# Patient Record
Sex: Male | Born: 1940
Health system: Southern US, Community
[De-identification: ages and names within clinical notes are randomized; demographics above are authoritative.]

## PROBLEM LIST (undated history)

## (undated) DIAGNOSIS — H409 Unspecified glaucoma: Secondary | ICD-10-CM

## (undated) DIAGNOSIS — N481 Balanitis: Secondary | ICD-10-CM

## (undated) DIAGNOSIS — H269 Unspecified cataract: Secondary | ICD-10-CM

## (undated) DIAGNOSIS — I639 Cerebral infarction, unspecified: Secondary | ICD-10-CM

## (undated) DIAGNOSIS — M1990 Unspecified osteoarthritis, unspecified site: Secondary | ICD-10-CM

## (undated) DIAGNOSIS — E785 Hyperlipidemia, unspecified: Secondary | ICD-10-CM

## (undated) DIAGNOSIS — I10 Essential (primary) hypertension: Secondary | ICD-10-CM

## (undated) DIAGNOSIS — Z86010 Personal history of colonic polyps: Secondary | ICD-10-CM

## (undated) HISTORY — DX: Hyperlipidemia, unspecified: E78.5

## (undated) HISTORY — DX: Cerebral infarction, unspecified: I63.9

## (undated) HISTORY — DX: Unspecified cataract: H26.9

## (undated) HISTORY — PX: CATARACT EXTRACTION: SUR2

## (undated) HISTORY — DX: Balanitis: N48.1

## (undated) HISTORY — DX: Unspecified osteoarthritis, unspecified site: M19.90

## (undated) HISTORY — DX: Essential (primary) hypertension: I10

## (undated) HISTORY — PX: TONSILLECTOMY: SHX5217

## (undated) HISTORY — DX: Personal history of colonic polyps: Z86.010

## (undated) HISTORY — DX: Unspecified glaucoma: H40.9

---

## 2001-05-30 ENCOUNTER — Ambulatory Visit (HOSPITAL_BASED_OUTPATIENT_CLINIC_OR_DEPARTMENT_OTHER): Admission: RE | Admit: 2001-05-30 | Discharge: 2001-05-30 | Payer: Self-pay | Admitting: *Deleted

## 2001-05-30 ENCOUNTER — Encounter (INDEPENDENT_AMBULATORY_CARE_PROVIDER_SITE_OTHER): Payer: Self-pay | Admitting: Specialist

## 2003-06-05 HISTORY — PX: COLONOSCOPY: SHX174

## 2004-04-22 ENCOUNTER — Ambulatory Visit: Payer: Self-pay | Admitting: Internal Medicine

## 2004-04-28 ENCOUNTER — Ambulatory Visit: Payer: Self-pay | Admitting: Internal Medicine

## 2004-08-21 ENCOUNTER — Emergency Department (HOSPITAL_COMMUNITY): Admission: EM | Admit: 2004-08-21 | Discharge: 2004-08-22 | Payer: Self-pay | Admitting: Emergency Medicine

## 2004-08-22 ENCOUNTER — Encounter: Payer: Self-pay | Admitting: Internal Medicine

## 2004-09-11 ENCOUNTER — Ambulatory Visit: Payer: Self-pay | Admitting: Internal Medicine

## 2004-09-17 ENCOUNTER — Ambulatory Visit: Payer: Self-pay

## 2004-10-15 ENCOUNTER — Ambulatory Visit: Payer: Self-pay | Admitting: Internal Medicine

## 2004-10-30 ENCOUNTER — Ambulatory Visit: Payer: Self-pay | Admitting: Internal Medicine

## 2004-12-31 ENCOUNTER — Ambulatory Visit (HOSPITAL_COMMUNITY): Admission: RE | Admit: 2004-12-31 | Discharge: 2004-12-31 | Payer: Self-pay | Admitting: Internal Medicine

## 2004-12-31 ENCOUNTER — Ambulatory Visit: Payer: Self-pay | Admitting: Internal Medicine

## 2005-05-08 ENCOUNTER — Ambulatory Visit: Payer: Self-pay | Admitting: Internal Medicine

## 2005-05-13 ENCOUNTER — Ambulatory Visit: Payer: Self-pay | Admitting: Internal Medicine

## 2005-09-03 ENCOUNTER — Ambulatory Visit: Payer: Self-pay | Admitting: Internal Medicine

## 2006-04-23 ENCOUNTER — Ambulatory Visit: Payer: Self-pay | Admitting: Internal Medicine

## 2006-06-10 ENCOUNTER — Ambulatory Visit: Payer: Self-pay | Admitting: Internal Medicine

## 2006-06-10 LAB — CONVERTED CEMR LAB
AST: 28 units/L (ref 0–37)
Basophils Absolute: 0 10*3/uL (ref 0.0–0.1)
Bilirubin, Direct: 0.2 mg/dL (ref 0.0–0.3)
CO2: 32 meq/L (ref 19–32)
Chloride: 105 meq/L (ref 96–112)
Cholesterol: 159 mg/dL (ref 0–200)
Creatinine, Ser: 0.9 mg/dL (ref 0.4–1.5)
Eosinophils Relative: 4.8 % (ref 0.0–5.0)
Glucose, Bld: 112 mg/dL — ABNORMAL HIGH (ref 70–99)
HCT: 40.1 % (ref 39.0–52.0)
HDL: 50.1 mg/dL (ref >39.0)
Hemoglobin, Urine: NEGATIVE
Hemoglobin: 13.7 g/dL (ref 13.0–17.0)
Ketones, ur: NEGATIVE mg/dL
Leukocytes, UA: NEGATIVE
MCHC: 34.2 g/dL (ref 30.0–36.0)
Monocytes Absolute: 0.4 10*3/uL (ref 0.2–0.7)
Neutrophils Relative %: 40.5 % — ABNORMAL LOW (ref 43.0–77.0)
Nitrite: NEGATIVE
PSA: 1.06 ng/mL (ref 0.10–4.00)
Potassium: 4.1 meq/L (ref 3.5–5.1)
RBC: 4.54 M/uL (ref 4.22–5.81)
RDW: 11.3 % — ABNORMAL LOW (ref 11.5–14.6)
Sodium: 143 meq/L (ref 135–145)
TSH: 1.11 microintl units/mL (ref 0.35–5.50)
Total Bilirubin: 1 mg/dL (ref 0.3–1.2)
Total Protein: 6.8 g/dL (ref 6.0–8.3)
Urobilinogen, UA: 0.2 (ref 0.0–1.0)
WBC: 3.1 10*3/uL — ABNORMAL LOW (ref 4.5–10.5)
pH: 8 (ref 5.0–8.0)

## 2006-06-15 ENCOUNTER — Ambulatory Visit: Payer: Self-pay | Admitting: Internal Medicine

## 2006-09-17 ENCOUNTER — Ambulatory Visit: Payer: Self-pay | Admitting: Internal Medicine

## 2006-10-13 ENCOUNTER — Ambulatory Visit: Payer: Self-pay | Admitting: Internal Medicine

## 2006-11-03 ENCOUNTER — Ambulatory Visit: Payer: Self-pay | Admitting: Internal Medicine

## 2006-11-03 LAB — CONVERTED CEMR LAB
Direct LDL: 140.6 mg/dL
Triglycerides: 283 mg/dL (ref 0–149)

## 2007-04-06 ENCOUNTER — Ambulatory Visit: Payer: Self-pay | Admitting: Internal Medicine

## 2007-05-16 ENCOUNTER — Encounter: Payer: Self-pay | Admitting: Internal Medicine

## 2007-05-16 DIAGNOSIS — I10 Essential (primary) hypertension: Secondary | ICD-10-CM | POA: Insufficient documentation

## 2007-05-16 DIAGNOSIS — E785 Hyperlipidemia, unspecified: Secondary | ICD-10-CM | POA: Insufficient documentation

## 2007-07-04 ENCOUNTER — Ambulatory Visit: Payer: Self-pay | Admitting: Internal Medicine

## 2007-07-04 LAB — CONVERTED CEMR LAB
Albumin: 3.9 g/dL (ref 3.5–5.2)
Basophils Absolute: 0 10*3/uL (ref 0.0–0.1)
Bilirubin, Direct: 0.1 mg/dL (ref 0.0–0.3)
Calcium: 9.3 mg/dL (ref 8.4–10.5)
Cholesterol: 190 mg/dL (ref 0–200)
Eosinophils Absolute: 0.1 10*3/uL (ref 0.0–0.6)
Eosinophils Relative: 3.3 % (ref 0.0–5.0)
GFR calc Af Amer: 96 mL/min
GFR calc non Af Amer: 79 mL/min
Glucose, Bld: 101 mg/dL — ABNORMAL HIGH (ref 70–99)
HDL: 45.5 mg/dL (ref >39.0)
LDL Cholesterol: 110 mg/dL — ABNORMAL HIGH (ref 0–99)
Lymphocytes Relative: 39 % (ref 12.0–46.0)
MCHC: 33 g/dL (ref 30.0–36.0)
MCV: 87.6 fL (ref 78.0–100.0)
Neutro Abs: 1.6 10*3/uL (ref 1.4–7.7)
Neutrophils Relative %: 43.5 % (ref 43.0–77.0)
Nitrite: NEGATIVE
PSA: 1.32 ng/mL (ref 0.10–4.00)
Platelets: 260 10*3/uL (ref 150–400)
Sodium: 142 meq/L (ref 135–145)
TSH: 1.53 microintl units/mL (ref 0.35–5.50)
Total CHOL/HDL Ratio: 4.2
Triglycerides: 174 mg/dL — ABNORMAL HIGH (ref 0–149)
Urine Glucose: NEGATIVE mg/dL
WBC: 3.6 10*3/uL — ABNORMAL LOW (ref 4.5–10.5)

## 2007-07-08 ENCOUNTER — Ambulatory Visit: Payer: Self-pay | Admitting: Internal Medicine

## 2007-07-08 DIAGNOSIS — M1990 Unspecified osteoarthritis, unspecified site: Secondary | ICD-10-CM | POA: Insufficient documentation

## 2007-09-07 ENCOUNTER — Ambulatory Visit: Payer: Self-pay | Admitting: Internal Medicine

## 2008-01-16 ENCOUNTER — Telehealth: Payer: Self-pay | Admitting: Internal Medicine

## 2008-01-31 ENCOUNTER — Ambulatory Visit: Payer: Self-pay | Admitting: Internal Medicine

## 2008-07-12 ENCOUNTER — Ambulatory Visit: Payer: Self-pay | Admitting: Internal Medicine

## 2008-07-12 LAB — CONVERTED CEMR LAB
Albumin: 3.8 g/dL (ref 3.5–5.2)
Alkaline Phosphatase: 67 units/L (ref 39–117)
Basophils Absolute: 0.1 10*3/uL (ref 0.0–0.1)
Cholesterol: 204 mg/dL — ABNORMAL HIGH (ref 0–200)
Direct LDL: 135.4 mg/dL
Eosinophils Absolute: 0.4 10*3/uL (ref 0.0–0.7)
GFR calc non Af Amer: 95.46 mL/min (ref >60)
HDL: 48.3 mg/dL (ref >39.00)
Hemoglobin, Urine: NEGATIVE
Hemoglobin: 13 g/dL (ref 13.0–17.0)
Lymphocytes Relative: 38.5 % (ref 12.0–46.0)
MCHC: 33.4 g/dL (ref 30.0–36.0)
Neutro Abs: 1.6 10*3/uL (ref 1.4–7.7)
Neutrophils Relative %: 39.5 % — ABNORMAL LOW (ref 43.0–77.0)
Nitrite: NEGATIVE
Platelets: 233 10*3/uL (ref 150.0–400.0)
Potassium: 4.6 meq/L (ref 3.5–5.1)
RDW: 11.5 % (ref 11.5–14.6)
Sodium: 140 meq/L (ref 135–145)
Total Protein, Urine: NEGATIVE mg/dL
Triglycerides: 108 mg/dL (ref 0.0–149.0)
Urobilinogen, UA: 0.2 (ref 0.0–1.0)
VLDL: 21.6 mg/dL (ref 0.0–40.0)

## 2008-07-18 ENCOUNTER — Ambulatory Visit (HOSPITAL_COMMUNITY): Admission: RE | Admit: 2008-07-18 | Discharge: 2008-07-18 | Payer: Self-pay | Admitting: Ophthalmology

## 2008-07-18 ENCOUNTER — Encounter: Payer: Self-pay | Admitting: Internal Medicine

## 2008-07-19 ENCOUNTER — Ambulatory Visit: Payer: Self-pay | Admitting: Internal Medicine

## 2008-07-19 DIAGNOSIS — H547 Unspecified visual loss: Secondary | ICD-10-CM

## 2008-07-19 DIAGNOSIS — L989 Disorder of the skin and subcutaneous tissue, unspecified: Secondary | ICD-10-CM | POA: Insufficient documentation

## 2008-08-09 ENCOUNTER — Encounter: Payer: Self-pay | Admitting: Internal Medicine

## 2008-11-30 ENCOUNTER — Ambulatory Visit: Payer: Self-pay | Admitting: Internal Medicine

## 2008-11-30 DIAGNOSIS — I6350 Cerebral infarction due to unspecified occlusion or stenosis of unspecified cerebral artery: Secondary | ICD-10-CM | POA: Insufficient documentation

## 2008-11-30 DIAGNOSIS — IMO0001 Reserved for inherently not codable concepts without codable children: Secondary | ICD-10-CM | POA: Insufficient documentation

## 2008-12-05 ENCOUNTER — Telehealth: Payer: Self-pay | Admitting: Internal Medicine

## 2008-12-19 ENCOUNTER — Ambulatory Visit: Payer: Self-pay | Admitting: Internal Medicine

## 2009-02-26 ENCOUNTER — Ambulatory Visit: Payer: Self-pay | Admitting: Internal Medicine

## 2009-02-26 DIAGNOSIS — M353 Polymyalgia rheumatica: Secondary | ICD-10-CM

## 2009-02-26 LAB — CONVERTED CEMR LAB: Rhuematoid fact SerPl-aCnc: 25.5 intl units/mL — ABNORMAL HIGH (ref 0.0–20.0)

## 2009-09-02 ENCOUNTER — Ambulatory Visit: Payer: Self-pay | Admitting: Internal Medicine

## 2009-10-14 ENCOUNTER — Telehealth: Payer: Self-pay | Admitting: Internal Medicine

## 2009-10-17 ENCOUNTER — Ambulatory Visit: Payer: Self-pay | Admitting: Internal Medicine

## 2009-10-17 LAB — CONVERTED CEMR LAB
Albumin: 4.4 g/dL (ref 3.5–5.2)
Alkaline Phosphatase: 56 units/L (ref 39–117)
LDL Cholesterol: 118 mg/dL — ABNORMAL HIGH (ref 0–99)
Total CHOL/HDL Ratio: 4
Triglycerides: 123 mg/dL (ref 0.0–149.0)
VLDL: 24.6 mg/dL (ref 0.0–40.0)

## 2009-10-20 ENCOUNTER — Encounter: Payer: Self-pay | Admitting: Internal Medicine

## 2010-03-21 ENCOUNTER — Ambulatory Visit: Payer: Self-pay | Admitting: Internal Medicine

## 2010-05-18 LAB — CONVERTED CEMR LAB
Basophils Absolute: 0 10*3/uL (ref 0.0–0.1)
Basophils Relative: 1.4 % (ref 0.0–3.0)
CO2: 31 meq/L (ref 19–32)
Eosinophils Absolute: 0.1 10*3/uL (ref 0.0–0.7)
Glucose, Bld: 93 mg/dL (ref 70–99)
HCT: 40.6 % (ref 39.0–52.0)
HDL: 55.3 mg/dL (ref >39.00)
Hemoglobin: 13.8 g/dL (ref 13.0–17.0)
Lymphs Abs: 1.4 10*3/uL (ref 0.7–4.0)
MCHC: 34.1 g/dL (ref 30.0–36.0)
Monocytes Relative: 11.2 % (ref 3.0–12.0)
Neutro Abs: 1 10*3/uL — ABNORMAL LOW (ref 1.4–7.7)
Potassium: 4.4 meq/L (ref 3.5–5.1)
RDW: 13.3 % (ref 11.5–14.6)
Sodium: 142 meq/L (ref 135–145)
Total Bilirubin: 0.5 mg/dL (ref 0.3–1.2)
Total CHOL/HDL Ratio: 4
Triglycerides: 178 mg/dL — ABNORMAL HIGH (ref 0.0–149.0)
VLDL: 35.6 mg/dL (ref 0.0–40.0)

## 2010-05-20 NOTE — Assessment & Plan Note (Signed)
Summary: FLU SHOT / MEN [facility] OK'D  Nurse Visit   Vitals Entered By: Lamar Sprinkles, CMA (March 21, 2010 2:12 PM)  Allergies: 1)  Lopid (Gemfibrozil) 2)  Niaspan (Niacin (Antihyperlipidemic)) 3)  Penicillin G Potassium (Penicillin G Potassium) 4)  Valtrex (Valacyclovir Hcl)  Immunizations Administered:  Influenza Vaccine # 1:    Vaccine Type: Fluvax MCR    Site: left deltoid    Mfr: GlaxoSmithKline    Dose: 0.5 ml    Route: IM    Given by: Lamar Sprinkles, CMA    Exp. Date: 10/18/2010    Lot #: [id number]    VIS given: 11/12/09 version given March 21, 2010.  Orders Added: 1)  Flu Vaccine 81yrs + MEDICARE PATIENTS [Q2039] 2)  Administration Flu vaccine - MCR [G0008]

## 2010-05-20 NOTE — Assessment & Plan Note (Signed)
Summary: YEARLY FU/ MEDICARE/ LABS AFTER/NWS   Vital Signs:  Patient profile:   70 year old male Height:      72 inches Weight:      196 pounds BMI:     26.68 O2 Sat:      98 % on Room air Temp:     97.5 degrees F oral Pulse rate:   56 / minute BP sitting:   158 / 96  (left arm) Cuff size:   regular  Vitals Entered By: Bill Salinas CMA (Sep 02, 2009 10:09 AM)  O2 Flow:  Room air CC: pt here for cpx, he has never had pneumonia vaccine or shingles vaccine/ ab  Vision Screening:      Vision Comments: Pt's last eye exam was about 1 year ago with Dr Harriette Bouillon. Pt has glaucoma in both eyes and degenerative macular degeneration.  Vision Entered By: Bill Salinas CMA (Sep 02, 2009 10:12 AM)   Primary Care Provider:  Norins  CC:  pt here for cpx and he has never had pneumonia vaccine or shingles vaccine/ ab.  History of Present Illness: Patient presents for routine follow-up. Life is REAL good. No major complaints. He continues to rider "murdercycles."  He admits to nocturia x 3 but does not feel this interferes with sleep. He is followed for glaucoma and macular degeneration but has been stable on treatment. He has had no significant recurrence of symptoms of PMR and has no limitations in his activities. He is looking forward to his 69th birthday.  Current Medications (verified): 1)  Bayer Aspirin 325 Mg Tabs (Aspirin) .... Take 1 Tablet By Mouth Once A Day 2)  Multivitamins   Tabs (Multiple Vitamin) .... Take 1 Tablet By Mouth Once A Day 3)  Lisinopril 20 Mg Tabs (Lisinopril) .Marland Kitchen.. 1 By Mouth Once Daily 4)  Alphagan P 0.1 % Soln (Brimonidine Tartrate) 5)  Clobex Spray 0.05 % Liqd (Clobetasol Propionate) .... As Needed 6)  Clobetasol Propionate 0.05 % Oint (Clobetasol Propionate) .... As Needed  Allergies (verified): 1)  Lopid (Gemfibrozil) 2)  Niaspan (Niacin (Antihyperlipidemic)) 3)  Penicillin G Potassium (Penicillin G Potassium) 4)  Valtrex (Valacyclovir Hcl)  Past  History:  Past Medical History: Last updated: 2007-07-26 Hyperlipidemia Hypertension balanitis, h/o Osteoarthritis, diffuse  Past Surgical History: Last updated: 05/16/2007 Cataract extraction Tonsillectomy  Family History: Last updated: 07/26/2007 father- deceased @69 : lung cancer mother- decased @88 : CVA @56  Neg- colon, prostate cancer; CAD Brother DM  Social History: Last updated: 07/26/2007 Micheline Rough BA, matriculated for MA divinity married '62 no children work: occupation Art therapist rides a Production designer, theatre/television/film.  Review of Systems  The patient denies anorexia, fever, weight loss, decreased hearing, hoarseness, dyspnea on exertion, peripheral edema, prolonged cough, abdominal pain, hematochezia, severe indigestion/heartburn, incontinence, muscle weakness, difficulty walking, unusual weight change, abnormal bleeding, enlarged lymph nodes, and angioedema.    Physical Exam  General:  WNWD AA male in no distress Head:  Normocephalic and atraumatic without obvious abnormalities. No apparent alopecia or balding. Eyes:  proptosis bilaterally. Bulbar conjunctiva injected. PERRLA. Fundiscopic exam deferred to opthal. Ears:  External ear exam shows no significant lesions or deformities.  Otoscopic examination reveals mild amount of ceruemn in the canals, tympanic membranes are intact bilaterally without bulging, retraction, inflammation or discharge. Hearing is grossly normal bilaterally. Nose:  no external deformity and no external erythema.   Mouth:  Oral mucosa and oropharynx without lesions or exudates.  Teeth in good repair. Neck:  supple, full ROM, no thyromegaly, and no  carotid bruits.   Chest Wall:  no deformities, no tenderness, and no masses.   Lungs:  Normal respiratory effort, chest expands symmetrically. Lungs are clear to auscultation, no crackles or wheezes. Heart:  Normal rate and regular rhythm. S1 and S2 normal without gallop, murmur, click, rub or other extra  sounds. Abdomen:  soft, non-tender, normal bowel sounds, no distention, no guarding, and no hepatomegaly.   Rectal:  No external abnormalities noted. Normal sphincter tone. No rectal masses or tenderness. Prostate:  3+ prostate, smooth without nodules or abnormalities Msk:  normal ROM, no joint tenderness, no joint swelling, no joint warmth, and no joint deformities.   Pulses:  2+ radial and DP pulses Extremities:  No clubbing, cyanosis, edema, or deformity noted with normal full range of motion of all joints.   Neurologic:  alert & oriented X3, cranial nerves II-XII intact, strength normal in all extremities, gait normal, and DTRs symmetrical and normal.   Skin:  turgor normal, color normal, no rashes, no suspicious lesions, and no ulcerations.   Cervical Nodes:  no anterior cervical adenopathy and no posterior cervical adenopathy.   Inguinal Nodes:  no R inguinal adenopathy and no L inguinal adenopathy.   Psych:  Oriented X3, memory intact for recent and remote, normally interactive, and good eye contact.     Impression & Recommendations:  Problem # 1:  POLYMYALGIA RHEUMATICA (ICD-725) No recurrent or present symptoms. Normal ROM about the shoulder girdle.  Orders: TLB-Sedimentation Rate (ESR) (85652-ESR)  Addendum - ESR 19 mm/hr - normal  Problem # 2:  Hx of LACUNAR INFARCTION (ICD-434.91) Normal exam with no obvious sequellae.  Labs reviewed and reveal LDL of 143.1 - not at goal of 100 or less (see below)  Plan - continue aspirin  His updated medication list for this problem includes:    Bayer Aspirin 325 Mg Tabs (Aspirin) .Marland Kitchen... Take 1 tablet by mouth once a day  Problem # 3:  UNSPECIFIED VISUAL LOSS (ICD-369.9) Follows closely with his opthalmologist for managment of glaucoma and macular degeneration. He is stable at this time.  Problem # 4:  HYPERTENSION (ICD-401.9)  His updated medication list for this problem includes:    Lisinopril 20 Mg Tabs (Lisinopril) .Marland Kitchen... 1 by  mouth once daily  Orders: TLB-BMP (Basic Metabolic Panel-BMET) (80048-METABOL)  BP today: 158/96 Prior BP: 160/98 (02/26/2009)  Poor control. He reports better readings at home.  Plan - patient to provide a series of BP readings: if averaging greater than 140/90 will need to adjust medications.  Problem # 5:  HYPERLIPIDEMIA (ICD-272.4)  Due for labs with recommendations to follow.  Orders: TLB-Lipid Panel (80061-LIPID) TLB-Hepatic/Liver Function Pnl (80076-HEPATIC) Prescription Created Electronically [phone])  Addendum - HDL 55.5 which is good; LDL 143.1 - greater than goal of 100 or less in light of CVA.  Plan - recommend starting medical therapy with lovastatin 20mg  once a day with follow-up lab in 4 weeks. (eScribed to [facility] [address])  His updated medication list for this problem includes:    Lovastatin 20 Mg Tabs (Lovastatin) .Marland Kitchen... 1 by mouth qpm for cholesterol control  Problem # 6:  Preventive Health Care (ICD-V70.0) Unremarkable exam. Lab results are good except for cholesterol reading including normal PSA. He is current for colorectal cancer screening with study in '05. He is current with tetnus immunization and is provided pneumonia immunization today. He works to follow a healthy diet and remains active. He is engaged and has no signs or symptoms of depression. He has  BPH but does not require medical intervention for symptoms.  In summary - a very nice gentleman who appears medically stable but who needs better lipid control. He will return for lab as noted, otherwise he will return as needed.   Complete Medication List: 1)  Bayer Aspirin 325 Mg Tabs (Aspirin) .... Take 1 tablet by mouth once a day 2)  Multivitamins Tabs (Multiple vitamin) .... Take 1 tablet by mouth once a day 3)  Lisinopril 20 Mg Tabs (Lisinopril) .Marland Kitchen.. 1 by mouth once daily 4)  Alphagan P 0.1 % Soln (Brimonidine tartrate) 5)  Clobex Spray 0.05 % Liqd (Clobetasol propionate) .... As needed 6)   Clobetasol Propionate 0.05 % Oint (Clobetasol propionate) .... As needed 7)  Lovastatin 20 Mg Tabs (Lovastatin) .Marland Kitchen.. 1 by mouth qpm for cholesterol control  Other Orders: TLB-CBC Platelet - w/Differential (85025-CBCD) TLB-PSA (Prostate Specific Antigen) (84153-PSA) Pneumococcal Vaccine (60109) Admin 1st Vaccine (32355) Subsequent annual wellness visit with prevention plan (D3220)  Patient: [name] Good Note: All result statuses are Final unless otherwise noted.  Tests: (1) Sed Rate (ESR)   Sed Rate                  19 mm/hr                    0-22  Tests: (2) Lipid Panel (LIPID)   Cholesterol          [H]  222 mg/dL                   2-542     ATP III Classification            Desirable:  < 200 mg/dL                    Borderline High:  200 - 239 mg/dL               High:  > = 240 mg/dL   Triglycerides        [H]  178.0 mg/dL                 7.0-623.7     Normal:  <150 mg/dL     Borderline High:  628 - 199 mg/dL   HDL                       31.51 mg/dL                 >76.16   VLDL Cholesterol          35.6 mg/dL                  0.7-37.1  CHO/HDL Ratio:  CHD Risk                             4                    Men          Women     1/2 Average Risk     3.4          3.3     Average Risk          5.0          4.4     2X Average Risk          9.6  7.1     3X Average Risk          15.0          11.0                           Tests: (3) Hepatic/Liver Function Panel (HEPATIC)   Total Bilirubin           0.5 mg/dL                   1.6-1.0   Direct Bilirubin          0.1 mg/dL                   9.6-0.4   Alkaline Phosphatase      64 U/L                      39-117   AST                       25 U/L                      0-37   ALT                       32 U/L                      0-53   Total Protein             7.0 g/dL                    5.4-0.9   Albumin                   4.4 g/dL                    8.1-1.9  Tests: (4) BMP (METABOL)   Sodium                     142 mEq/L                   135-145   Potassium                 4.4 mEq/L                   3.5-5.1   Chloride                  103 mEq/L                   96-112   Carbon Dioxide            31 mEq/L                    19-32   Glucose                   93 mg/dL                    14-78   BUN                       14 mg/dL                    2-95  Creatinine                0.9 mg/dL                   1.6-1.0   Calcium                   9.4 mg/dL                   9.6-04.5   GFR                       108.83 mL/min               >60  Tests: (5) CBC Platelet w/Diff (CBCD)   White Cell Count     [L]  2.9 K/uL                    4.5-10.5   Red Cell Count            4.73 Mil/uL                 4.22-5.81   Hemoglobin                13.8 g/dL                   40.9-81.1   Hematocrit                40.6 %                      39.0-52.0   MCV                       85.9 fl                     78.0-100.0   MCHC                      34.1 g/dL                   91.4-78.2   RDW                       13.3 %                      11.5-14.6   Platelet Count            221.0 K/uL                  150.0-400.0   Neutrophil %         [L]  35.5 %                      43.0-77.0   Lymphocyte %         [H]  47.1 %                      12.0-46.0   Monocyte %                11.2 %                      3.0-12.0   Eosinophils%              4.8 %  0.0-5.0   Basophils %               1.4 %                       0.0-3.0   Neutrophill Absolute [L]  1.0 K/uL                    1.4-7.7   Lymphocyte Absolute       1.4 K/uL                    0.7-4.0   Monocyte Absolute         0.3 K/uL                    0.1-1.0  Eosinophils, Absolute                             0.1 K/uL                    0.0-0.7   Basophils Absolute        0.0 K/uL                    0.0-0.1  Tests: (6) Prostate Specific Antigen (PSA)   PSA-Hyb                   1.77 ng/mL                  0.10-4.00  Tests: (7) Cholesterol LDL -  Direct (DIRLDL)  Cholesterol LDL - Direct                             143.1 mg/dL     Optimal:  <161 mg/dL     Near or Above Optimal:  100-129 mg/dL     Borderline High:  096-045 mg/dL     High:  409-811 mg/dL     Very High:  >914 mg/dL Prescriptions: LOVASTATIN 20 MG TABS (LOVASTATIN) 1 by mouth qPM for cholesterol control  #30 x 12   Entered and Authorized by:   Jacques Navy MD   Signed by:   Jacques Navy MD on 09/03/2009   Method used:   Electronically to        [facility]. [phone]* (retail)       [address]       [place], [place]  [address]       Ph: [phone]       Fax: [phone]   RxID:   [id number]    Immunizations Administered:  Pneumonia Vaccine:    Vaccine Type: Pneumovax    Site: left deltoid    Mfr: Merck    Dose: 0.5 ml    Route: IM    Given by: Ami Bullins CMA    Exp. Date: 02/12/2011    Lot #: [id number]    VIS given: 11/16/95 version given Sep 02, 2009.

## 2010-05-20 NOTE — Progress Notes (Signed)
Summary: WANTING LABS  Phone Note Call from Patient Call back at Home Phone [phone]   Caller: walk in sheet Summary of Call: PT IS REQUESTING TO HAVE HIS CHLORESTEROL CHECKED AFTER 30 DAYS ON A NEW MEDICINE. Initial call taken by: Hilarie Fredrickson,  October 14, 2009 1:14 PM  Follow-up for Phone Call        OK for lipid 272.4, hepatic 995.20 Follow-up by: Jacques Navy MD,  October 14, 2009 5:51 PM  Additional Follow-up for Phone Call Additional follow up Details #1::        informed pt and put in idx Additional Follow-up by: Ami Bullins CMA,  October 15, 2009 9:02 AM

## 2010-05-20 NOTE — Letter (Signed)
   [facility] [address] [place], [place]  [address] Phone: [phone]      October 20, 2009   [name] [address] [place], [place] [address]  RE:  LAB RESULTS  Dear  [name],  The following is an interpretation of your most recent lab tests.  Please take note of any instructions provided or changes to medications that have resulted from your lab work.  LIVER FUNCTION TESTS:  Good - no changes needed  LIPID PANEL:  Good - no changes needed Triglyceride: 123.0   Cholesterol: 198   LDL: 118   HDL: 55.20   Chol/HDL%:  4    Good response to medication. Recommend continuing medication.   Sincerely Yours,    Jacques Navy MD

## 2010-09-02 NOTE — Assessment & Plan Note (Signed)
[facility]                           PRIMARY CARE OFFICE NOTE   NAME:Good, [name] VACHA                  MRN:          [id number]  DATE:10/13/2006                            DOB:          [birth year]    [name] Good was seen approximately 3 or 4 weeks ago for balanitis and  was treated with Diflucan 100 mg daily times seven.  He reports that the  balanitis basically resolved.  Subsequently he has taken a motorcycle  trip and has recurrent problem with erythema and discomfort at the glans  penis.   CURRENT MEDICATIONS:  Lasix, multivitamin, aspirin, Lipitor.   DRUG ALLERGIES:  PENICILLIN.   LIMITED EXAM:  Temperature was 97.4, blood pressure 154/92, pulse was  80, weight 208.  GENERAL APPEARANCE:  Well-nourished, well-developed gentleman in no  acute distress.  GU:  Patient has erythema but without swelling at the glans penis.   IMPRESSION AND PLAN:  1. Balanitis.  Patient with recurrent balanitis.  PLAN:  Diflucan 100      mg daily times fourteen.  The patient may use topical Gold Bond      powder or Lamisil at his own discretion.  If this does not resolve      his infection, would have him see dermatology.  2. Hypertension.  The patient's blood pressure is poorly controlled on      Lasix alone.  PLAN:  We will add lisinopril 10 mg p.o. daily for      better control.  The patient is to monitor his blood pressures at      home and notify me if her fails to have adequate control with      systolic blood pressures in the 130s.     Rosalyn Gess Norins, MD  Electronically Signed    MEN/MedQ  DD: 10/14/2006  DT: 10/14/2006  Job #: [id number]

## 2010-09-03 ENCOUNTER — Encounter: Payer: Self-pay | Admitting: Internal Medicine

## 2010-09-05 ENCOUNTER — Encounter: Payer: Self-pay | Admitting: Internal Medicine

## 2010-09-05 NOTE — Assessment & Plan Note (Signed)
[facility]                           PRIMARY CARE OFFICE NOTE   NAME:Schroll, LAVONE WEISEL                  MRN:          [id number]  DATE:06/15/2006                            DOB:          05-04-1940    Mr. Crite is a 70 year old African American gentleman who presents  for followup evaluation and exam.  He was last seen in the office  April 23, 2006 for sinus congestion and cough which did well with  Claritin, Phenergan and Nasonex.  The patient's blood pressure has also  been poorly controlled.  He was tried on Lisinopril HCT.  He reports he  had significant diarrhea secondary to the medication, which he  discontinued.  However, he has increased his exercise regimen, watched  his diet more carefully and brought his blood pressure down with  lifestyle management.   PAST MEDICAL HISTORY:  Is well documented in my note of May 13, 2005.   ALLERGIES:  PENICILLIN with anaphylaxis.   CURRENT MEDICATIONS:  1. Aspirin 325 mg daily.  2. Lipitor 20 mg daily.  3. Multivitamin daily.  4. Lasix 20 mg daily.   HABITS:  Tobacco none, alcohol none.   FAMILY HISTORY, SOCIAL HISTORY:  Well documented in my previous note.   REVIEW OF SYSTEMS:  Is negative for constitutional, cardiovascular,  respiratory, GI, GU, musculoskeletal complaints.   EXAMINATION:  Temperature was 98.2, blood pressure 137/84.  Pulse 77.  Weight 209.  GENERAL APPEARANCE:  A well-nourished, well-developed, mildly overweight  African American gentleman in no acute distress.  HEENT:  Normocephalic.  Atraumatic.  EACs and TMS were unremarkable.  Oropharynx with native dentition in good repair.  No buccal or palatal  lesions were noted.  Posterior pharynx was clear.  Conjunctivae, sclerae  was clear.  PERRLA, EOMI, funduscopic exam was unremarkable.  NECK:  Was supple without thyromegaly.  NODES:  No adenopathy was noted in the cervical, supraclavicular  regions.  CHEST:   No CVA tenderness.  LUNGS:  Were clear to auscultation and percussion.  CARDIOVASCULAR:  2+ radial pulse, no JVD.  No carotid bruits.  He had a  quiet precordium with regular rate and rhythm without murmurs, rubs or  gallops.  ABDOMEN:  Is soft, no guarding or rebound.  No organosplenomegaly was  appreciated.  GENITALIA:  Was unremarkable with bilaterally descended testicles  without masses.  RECTAL:  Revealed normal sphincter tone.  Prostate was smooth, round,  normal size and contour and normal.  EXTREMITIES:  Without clubbing, cyanosis, edema or deformity.  NEUROLOGIC:  Was nonfocal.  SKIN:  Was clear.   DATA BASE:  A 12-lead electrocardiogram revealed a normal sinus rhythm,  increased voltage criteria consistent with LVH, otherwise a normal  study.   LABORATORY:  Hemoglobin 13.7 grams, white count was 3,100 with 40.5%  segs, 39.6% lymphs, 13.8% monos, 1.3% basophils.  Chemistries were  normal with a glucose of 112, potassium was 4.1, kidney function was  normal with a creatinine of 0.9 and a TFR of 109 mL per minute.  Liver  functions were normal.  Cholesterol was 159, triglycerides 147, HDL was  50.1,  LDL was 80. Thyroid function normal with TSH of 1.11, PSA for  prostate cancer was normal at 1.06, urinalysis was negative.   ASSESSMENT AND PLAN:  1. Hypertension.  The patient is adequately controlled with lifestyle      management alone plus Lasix.  He will continue on this regimen and      continue his exercise program.  He does have some mild cardiac      changes related to hypertension, LDH.  2. Lipids.  The patient with an excellent response to Lipitor with      normal liver functions and he is at goal with an LDL of less than      100.  3. Health maintenance.  The patient's last colonoscopy June 05, 2003 with follow up in 2015.  The patient has had lower extremity      venous Dopplers that were negative.  Last chest x-ray from 1996.      Patient does have a  normal examination and does not abuse tobacco.   SUMMARY:  This is a very pleasant gentleman who is medically stable at  this time.  He will continue with his exercise and lifestyle program.  He will continue with Lasix and Lipitor.  I have asked him to return to  see me on a p.r.n. basis or in 1 year.     Rosalyn Gess Norins, MD  Electronically Signed    MEN/MedQ  DD: 06/16/2006  DT: 06/16/2006  Job #: [id number]   cc:   [name]

## 2010-09-05 NOTE — Op Note (Signed)
[place]  Patient:    [name] Good, [name] Good Visit Number: [id number] MRN: [id number]          Service Type: DSU Location: [facility] Attending Physician:  Harmon Pier Dictated by:   Cornelius Moras Egbert Garibaldi, D.D.S. Admit Date:  05/30/2001                             Operative Report  DATE OF BIRTH:  10/06/1940  PREOPERATIVE DIAGNOSIS:  Impacted tooth #32 with associated pathology (possibly cyst).  POSTOPERATIVE DIAGNOSIS:  Impacted tooth #32 with associated pathology (possibly cyst).  PROCEDURE PERFORMED:  Surgical removal of tooth #32 with biopsy of suspected cyst and use of autologous platelet-rich plasma.  ANESTHESIA:  General via nasotracheal intubation; also 2% Xylocaine with 100,000 epinephrine - 8 cc was given total.  OTHER MEDICATIONS:  He was also given clindamycin 600 mg IV piggyback and Decadron 8 mg intraoperatively.  OPERATIVE INDICATIONS:  This is a 70 year old black male referred by Dr. Lew Dawes for evaluation and removal of a cyst associated with tooth #32.  The lesion is asymptomatic but apparently has been present since 1992 on radiographs.  The lesion is slowly growing on radiographs but has increased in size.  PAST MEDICAL HISTORY:  ALLERGIES:  PENICILLIN.  MEDICATIONS TAKEN:  None.  HOSPITALIZATIONS:  Tonsillectomy approximately age 73 plus history of mild glaucoma.  REVIEW OF SYSTEMS:  He denies hypertension, shortness of breath, chest pain, diabetes, kidney or liver disease.  PHYSICAL EXAMINATION:  EXTRAORAL:  Within normal limits.  INTRAORAL:  He had much dental work with bridges.  There is no buckling or expansion around tooth #32 area.  LABORATORY DATA:  Panoramics reveal a vertical impacted tooth #32 the length of the mandible in height and 1 x 1 cm radiolucency on the mesial going towards tooth #31, and the inferior alveolar nerve apparently crossed tooth #32 one-half way up the  root.  ASSESSMENT AND PLAN:  Cystic process #32 with impacted tooth.  The plan was to extract #32, biopsy the area, and use platelet-rich plasma to hopefully increase the bone formation here postoperatively to decrease the postoperative pain response postoperatively.  The risks of the procedure were described as possible mandible fracture due to the fact that the tooth was about the size of the mandible.  Mr. Easterly requested to proceed at this point.  DESCRIPTION OF PROCEDURE:  The patient was brought to the operating room of the day surgery center at [facility] and placed in the supine position.  Appropriate monitors were then attached.  General anesthesia was then induced via IV and inhalation techniques and left nasotracheal intubation was then done without complications.  The oral cavity was suctioned and a throat pack was placed.  At this point, 2% Xylocaine with 100,000 epinephrine, total of 6 cc, was given to the inferior alveolar buccal and buccal infiltration of the patients right side was given at this point.  The patient was then prepped and draped in the standard fashion for an intraoral/oral maxillofacial surgical procedure.  At this point I then drew 20 cc of the patients blood from his right antecubital fossa which was then passed off the field for use in the platelet-rich plasma machine, later to be placed in this region for adequate growth factors.  At this point, a #15 blade was then used to make an incision posterior to tooth #31 in a lateral direction and a  vertical release was then done in this region.  A #8 round burr and a copious ______ was used to cut a buccal trough around tooth #32 area.  It was noted that upon drilling more towards the mesial, a cystic process was evident. A periosteal elevator was used to reflect the lingual tissue slightly and this was used as a protective mechanism and the #8 round burr was used to cut a small trough going  towards the distal lingual of the tooth.  At this point, normal saline irrigation followed and a periosteal elevator was used to elevate the tooth just a bit, at which point it was noted to move just a tad. At this point the #8 round burr was used to cut the crown in a horizontal fashion at the cementoenamel junction area going approximately two-thirds the way through the root in a horizontal fashion.  A straight elevator was used to then crack the tooth at this point and the crown was removed separately, then a periosteal elevator was used to remove the root.  This was then done without complications.  The oral cavity was then copiously irrigated with normal saline and there was noted to be a cystic process more going over towards the mesial on tooth #31 area.  A curette was used and a cystic-type lesion, approximately 1 x 1 cm was curetted out going towards the mesial.  This was a large defect going underneath tooth #31 region.  Normal saline irrigation followed, and viewing through a dental mirror it was difficult to see more towards the mesial, but looked to be relatively clean and a curette was used to palpate the bone on the near side.  It was noted in the extraction socket that the tooth had been there for so many years that the lingual plate had eroded - it was relatively smooth in this region, and the floor of the mouth was evident going towards the lingual area.  The mandible was stable.  There was no evidence of any types of fractures, and the oral cavity was then suctioned and 3-0 chromic x 3 were placed in the horizontal-type incision, and then the platelet-rich plasma in the premade gun was then used to inject approximately 3 cc into this region with a blotting motion done to kind of blot the PRP so as not to exude.  The vertical release was then closed with 3-0 chromic x 3.  The oral cavity was suctioned, the throat pack was removed. Needle, sponge, and instrument counts were  all found to be correct at this point.  ESTIMATED BLOOD LOSS:  Approximately 20 cc.  URINE OUTPUT:  Patient due to void.   FLUID REPLACEMENT:  300 cc.  Patient had been given 8 mg Decadron, clindamycin 600 mg IV piggyback.  DISPOSITION:  The patient was then extubated and transferred to recovery room with vital signs stable in sedated state. Dictated by:   Cornelius Moras Egbert Garibaldi, D.D.S. Attending Physician:  Harmon Pier DD:  05/30/01 TD:  05/30/01 Job: [phone] [id number]

## 2010-09-08 ENCOUNTER — Other Ambulatory Visit: Payer: Self-pay | Admitting: Internal Medicine

## 2010-09-29 ENCOUNTER — Other Ambulatory Visit: Payer: Self-pay | Admitting: Internal Medicine

## 2010-10-24 ENCOUNTER — Ambulatory Visit (INDEPENDENT_AMBULATORY_CARE_PROVIDER_SITE_OTHER): Payer: Medicare Other | Admitting: Internal Medicine

## 2010-10-24 ENCOUNTER — Other Ambulatory Visit (INDEPENDENT_AMBULATORY_CARE_PROVIDER_SITE_OTHER): Payer: Medicare Other

## 2010-10-24 ENCOUNTER — Encounter: Payer: Self-pay | Admitting: Internal Medicine

## 2010-10-24 ENCOUNTER — Ambulatory Visit (INDEPENDENT_AMBULATORY_CARE_PROVIDER_SITE_OTHER)
Admission: RE | Admit: 2010-10-24 | Discharge: 2010-10-24 | Disposition: A | Discharge status: Home / Self Care | Payer: Medicare Other | Source: Ambulatory Visit | Attending: Internal Medicine | Admitting: Internal Medicine

## 2010-10-24 VITALS — BP 138/90 | HR 62 | Temp 97.2°F | Wt 193.0 lb

## 2010-10-24 DIAGNOSIS — I10 Essential (primary) hypertension: Secondary | ICD-10-CM

## 2010-10-24 DIAGNOSIS — E785 Hyperlipidemia, unspecified: Secondary | ICD-10-CM

## 2010-10-24 DIAGNOSIS — M1990 Unspecified osteoarthritis, unspecified site: Secondary | ICD-10-CM

## 2010-10-24 DIAGNOSIS — H547 Unspecified visual loss: Secondary | ICD-10-CM

## 2010-10-24 DIAGNOSIS — Z136 Encounter for screening for cardiovascular disorders: Secondary | ICD-10-CM

## 2010-10-24 DIAGNOSIS — M542 Cervicalgia: Secondary | ICD-10-CM

## 2010-10-24 DIAGNOSIS — G8929 Other chronic pain: Secondary | ICD-10-CM

## 2010-10-24 DIAGNOSIS — M353 Polymyalgia rheumatica: Secondary | ICD-10-CM

## 2010-10-24 DIAGNOSIS — Z0000 Encounter for general adult medical examination without abnormal findings: Secondary | ICD-10-CM

## 2010-10-24 LAB — SEDIMENTATION RATE: Sed Rate: 20 mm/hr (ref 0–22)

## 2010-10-24 LAB — COMPREHENSIVE METABOLIC PANEL
Albumin: 4.8 g/dL (ref 3.5–5.2)
BUN: 16 mg/dL (ref 6–23)
CO2: 31 mEq/L (ref 19–32)
Calcium: 9.4 mg/dL (ref 8.4–10.5)
Chloride: 104 mEq/L (ref 96–112)
Creatinine, Ser: 1.1 mg/dL (ref 0.4–1.5)
GFR: 85.85 mL/min (ref >60.00)
Potassium: 4.4 mEq/L (ref 3.5–5.1)

## 2010-10-24 LAB — HEPATIC FUNCTION PANEL
Albumin: 4.8 g/dL (ref 3.5–5.2)
Alkaline Phosphatase: 59 U/L (ref 39–117)
Bilirubin, Direct: 0.1 mg/dL (ref 0.0–0.3)

## 2010-10-24 LAB — LIPID PANEL
Cholesterol: 181 mg/dL (ref 0–200)
Triglycerides: 147 mg/dL (ref 0.0–149.0)

## 2010-10-24 NOTE — Progress Notes (Signed)
Subjective:    Patient ID: [name] Good, male    DOB: 04/10/1941, 70 y.o.   MRN: [id number]  HPI   The patient is here for annual Medicare wellness examination and management of other chronic and acute problems. In the interval he has been doing well. But, he has a problem with eye-sight. He also has chronically stiff neck that impairs his ability to ride his motorcycle.    The risk factors are reflected in the social history.  The roster of all physicians providing medical care to patient - is listed in the Snapshot section of the chart.  Activities of daily living:  The patient is 100% inedpendent in all ADLs: dressing, toileting, feeding as well as independent mobility  Home safety : The patient has smoke detectors in the home. They wear seatbelts.  Firearms are present in the home, kept in a safe fashion. There is no violence in the home.   There is no risks for hepatitis, STDs or HIV. There is no   history of blood transfusion. They have no travel history to infectious disease endemic areas of the world.  The patient has seen their dentist in the last six month. They have  seen their eye doctor in the last year. They deny any hearing difficulty and have not had audiologic testing in the last year.  They do not  have excessive sun exposure. Discussed the need for sun protection: hats, long sleeves and use of sunscreen if there is significant sun exposure.   Diet: the importance of a healthy diet is discussed. They do have a healthy diet with room for improvement.  The patient has a regular exercise program: walking and cardio , 1 hour duration, 3-4 times per week.  The benefits of regular aerobic exercise were discussed.  Depression screen: there are no signs or vegative symptoms of depression- irritability, change in appetite, anhedonia, sadness/tearfullness.  Cognitive assessment: the patient manages all their financial and personal affairs and is actively engaged. They could  relate day,date,year and events; recalled 3/3 objects at 3 minutes; performed clock-face test normally.  The following portions of the patient's history were reviewed and updated as appropriate: allergies, current medications, past family history, past medical history,  past surgical history, past social history  and problem list.  Vision, hearing, body mass index were assessed and reviewed.   During the course of the visit the patient was educated and counseled about appropriate screening and preventive services including : fall prevention , diabetes screening, nutrition counseling, colorectal cancer screening, and recommended immunizations.  Past Medical History  Diagnosis Date  . Hypertension   . Hyperlipidemia   . Balanitis     hx of  . Osteoarthritis     diffuse   Past Surgical History  Procedure Date  . Cataract extraction   . Tonsillectomy    Family History  Problem Relation Age of Onset  . Other Mother 62    CVA  . Cancer Father     lung  . Diabetes Brother   . Coronary artery disease Neg Hx    History   Social History  . Marital Status: Married    Spouse Name: N/A    Number of Children: 0  . Years of Education: 52   Occupational History  . PASTOR    Social History Main Topics  . Smoking status: Former Smoker    Types: Cigarettes    Quit date: 04/20/1977  . Smokeless tobacco: Never Used  . Alcohol Use:  No  . Drug Use: No  . Sexually Active: Yes -- Male partner(s)   Other Topics Concern  . Not on file   Social History Narrative   HSG, Micheline Rough, [place]. Married '62.    No children. Work - Engineer, production. Lives with wife - iADLs. End of Life Care - provided packet (July '12)       Review of Systems Review of Systems  Constitutional:  Negative for fever, chills, activity change and unexpected weight change.  HEENT:  Negative for hearing loss, ear pain, congestion, and postnasal drip. Negative for sore throat or swallowing problems.  Negative for dental complaints. Positive for neck stiffness and decreased ROM.  Eyes: Positive for vision loss or change in visual acuity.  Respiratory: Negative for chest tightness and wheezing.   Cardiovascular: Negative for chest pain and palpitation. No decreased exercise tolerance Gastrointestinal: No change in bowel habit. No bloating or gas. No reflux or indigestion Genitourinary: Negative for urgency, frequency, flank pain and difficulty urinating.  Musculoskeletal: Negative for myalgias, back pain, arthralgias and gait problem.  Neurological: Negative for dizziness, tremors, weakness and headaches.  Hematological: Negative for adenopathy.  Psychiatric/Behavioral: Negative for behavioral problems and dysphoric mood.       Objective:   Physical Exam Vital signs reviewed-stable Gen'l: Well nourished well developed AA male in no acute distress  HEENT:  Head: Normocephalic and atraumatic.  Right Ear: External ear normal. EAC/TM nl Mild cerumen build-up w/o obstruction Left Ear: External ear normal.  EAC/TM nl Mild cerumen build-up w/o obstruction Nose: Nose normal.  Mouth/Throat: Oropharynx is clear and moist. Dentition - native, in good repair. No buccal or palatal lesions. Posterior pharynx clear. Eyes: Conjunctivae and sclera clear. EOM intact. Pupils are equal, round, and reactive to light. Further exam deferred to ophtalomology Neck: Decreased  range of motion. Neck stiff. No JVD present. No tracheal deviation present. No thyromegaly present.  Cardiovascular: Normal rate, regular rhythm, no gallop, no friction rub, no murmur heard.      Quiet precordium. 2+ radial and DP pulses . No carotid bruits Pulmonary/Chest: Effort normal. No respiratory distress or increased WOB, no wheezes, no rales. No chest wall deformity or CVAT. Abdominal: Soft. Bowel sounds are normal in all quadrants. He exhibits no distension, no tenderness, no rebound or guarding, No heptosplenomegaly    Genitourinary:  deferred Musculoskeletal: Normal range of motion. He exhibits no edema and no tenderness.       Small and large joints without redness, synovial thickening or deformity. Full range of motion preserved about all small, median and large joints.  Lymphadenopathy:    He has no cervical or supraclavicular adenopathy.  Neurological: He is alert and oriented to person, place, and time. CN II-XII intact. DTRs 2+ and symmetrical biceps, radial and patellar tendons. Cerebellar function normal with no tremor, rigidity, normal gait and station.  Skin: Skin is warm and dry. No rash noted. No erythema.  Psychiatric: He has a normal mood and affect. His behavior is normal. Thought content normal.   Lab Results  Component Value Date   WBC 2.9* 09/02/2009   HGB 13.8 09/02/2009   HCT 40.6 09/02/2009   PLT 221.0 09/02/2009   CHOL 181 10/24/2010   TRIG 147.0 10/24/2010   HDL 63.00 10/24/2010   LDLDIRECT 143.1 09/02/2009   ALT 39 10/24/2010   ALT 39 10/24/2010   AST 27 10/24/2010   AST 27 10/24/2010   NA 141 10/24/2010   K 4.4 10/24/2010   CL 104  10/24/2010   CREATININE 1.1 10/24/2010   BUN 16 10/24/2010   CO2 31 10/24/2010   TSH 1.10 10/24/2010   PSA 1.77 09/02/2009   Lab Results  Component Value Date   LDLCALC 89 10/24/2010            Assessment & Plan:

## 2010-10-26 ENCOUNTER — Telehealth: Payer: Self-pay | Admitting: Internal Medicine

## 2010-10-26 ENCOUNTER — Encounter: Payer: Self-pay | Admitting: Internal Medicine

## 2010-10-26 DIAGNOSIS — Z0001 Encounter for general adult medical examination with abnormal findings: Secondary | ICD-10-CM | POA: Insufficient documentation

## 2010-10-26 NOTE — Telephone Encounter (Signed)
Please call: sed rate normal and neck x-rays with arthritic change. No new instructions. Thanks

## 2010-10-26 NOTE — Assessment & Plan Note (Addendum)
Increase problem with decrease ROM neck - difficult to rotate neck, especially to the left. Most likely progressive DJD.  Plan - cervical spine films.  Addendum:  *RADIOLOGY REPORT*  Clinical Data: Degenerative disc disease. Limited range of motion.  CERVICAL SPINE - 2-3 VIEW  Comparison: None.  Findings: Bilateral carotid atherosclerosis is present. Cervical  spinal alignment is anatomic. Cervicothoracic junction normal.  Mild degenerative disc disease is present at C3-C4, C4-C5 and C5-  C6. Craniocervical alignment normal. No fracture. Calcification  posterior spinous process of C7.  IMPRESSION:  Mild mid cervical spondylosis.  Osteoarthritis probable cause of stiffness in the neck.  Plan - range of motion exercise: may go to [email or web address] and search neck pain and stretch or may be referred to PT if he wishes.

## 2010-10-26 NOTE — Assessment & Plan Note (Signed)
Lab reveals good  control with LDL 89 with a goal of 130 or less.  Plan - continue present medication and life-style.

## 2010-10-26 NOTE — Assessment & Plan Note (Signed)
Continued problem - now limiting his ability to safely drive a motorcycle.  Plan - per opthalmology

## 2010-10-26 NOTE — Assessment & Plan Note (Signed)
BP Readings from Last 3 Encounters:  10/24/10 138/90  09/02/09 158/96  02/26/09 160/98   Adequate control on present medication.  Plan - continued out of office monitoring to report back if systolic BP consistently runs in the high 130's for adjustment in medications.

## 2010-10-26 NOTE — Assessment & Plan Note (Signed)
Interval history as noted with neck stiffness and declining vision. Physical exam is unremarkable except for decrease in cervical ROM. Prostate screening last PSA OK. Colorectal cancer screening - current with last study February '05. Immunizations: Tetanus Nov'11; Pneumonia vaccine May '11; Shingles - he has had shingles. 12 lead EKG - normal with no signs of ischemia or injury.  In summary - a very nice man who is medically stable except for osteoarthritis affecting his neck and for opthalmologic problems. He is advised to keep up the good work with is exercise program and healthy diet. He will return in 1 year or prn.

## 2010-10-27 NOTE — Telephone Encounter (Signed)
Informed pt .

## 2010-11-06 ENCOUNTER — Telehealth: Payer: Self-pay | Admitting: *Deleted

## 2010-11-06 DIAGNOSIS — M436 Torticollis: Secondary | ICD-10-CM

## 2010-11-06 NOTE — Telephone Encounter (Signed)
Pt states he did not receive Living will package.  Also, he state he has limited ROM in neck and stiffness. He is requesting referral to Dr. Ellamae Sia for this.

## 2010-11-07 NOTE — Telephone Encounter (Signed)
Packet mailed.  Pt informed.

## 2010-11-07 NOTE — Telephone Encounter (Signed)
Ok for referral to Dr. Clyda Hurdle, but I don't know him - ortho? Chiro? Please mail patient the living will packet. thanks

## 2010-11-10 NOTE — Telephone Encounter (Signed)
Referral entered  

## 2011-02-07 ENCOUNTER — Other Ambulatory Visit: Payer: Self-pay | Admitting: Internal Medicine

## 2011-02-16 ENCOUNTER — Ambulatory Visit (INDEPENDENT_AMBULATORY_CARE_PROVIDER_SITE_OTHER): Payer: Medicare Other | Admitting: Internal Medicine

## 2011-02-16 ENCOUNTER — Encounter: Payer: Self-pay | Admitting: Internal Medicine

## 2011-02-16 VITALS — BP 132/84 | HR 93 | Temp 98.0°F | Wt 202.0 lb

## 2011-02-16 DIAGNOSIS — J208 Acute bronchitis due to other specified organisms: Secondary | ICD-10-CM

## 2011-02-16 DIAGNOSIS — J209 Acute bronchitis, unspecified: Secondary | ICD-10-CM

## 2011-02-16 NOTE — Progress Notes (Signed)
  Subjective:    Patient ID: [name] Good, male    DOB: Jun 25, 1940, 70 y.o.   MRN: [id number]  HPI [name] Good presents today due to expirational wheezing for the past week. He has had some mild DOE more than usual. He has no fever,  Mild amount of sputum production - not bitter tasting, no blood.   I have reviewed the patient's medical history in detail and updated the computerized patient record.    Review of Systems System review is negative for any constitutional, cardiac, pulmonary, GI or neuro symptoms or complaints other than as described in the HPI.     Objective:   Physical Exam Vitals - good BP, good O2 sat gen'l - WNWD AA man in no distress Chest - no deformity Lungs - good BS through out with no wheezing, no accessory muscle use Cor - RRR       Assessment & Plan:  Bronchitis - mild viral bronchitis with some airway inflammation and wheezing. No evidence of bacterial infection or need for antibiotics  Plan - supportive care           Flovent diskus to use 1 inhalation q PM until symptoms are resolved.           Patient handout.

## 2011-02-16 NOTE — Patient Instructions (Signed)
Probably a viral type of mild bronchitis with some airway inflammation that is responsible for wheezing and generalized malaise. Plan - no indication for antibiotics. For the cough - robitussin DM or the generic equivalent 1 tsp every 4-6 hours; flovent diskus inhaler - an inhalational topical steroid- 1 inhalation in the evening to reduce airway inflammation and wheezing; fluids; vitamin C; rest.   Acute Bronchitis Bronchitis is when the organs and tissues involved in breathing get puffy (swollen) and can leak fluid. This makes it harder for air to get in and out of the lungs. You may cough a lot and produce thick spit (mucus). Acute means the illness started suddenly. HOME CARE  Rest.     Drink enough fluids to keep the pee (urine) clear or pale yellow.     Medicines may be given that will open up your airways to help you breathe better. Only take medicine as told by your doctor.     Use a cool mist vaporizer. This will help to thin any thick spit.     Do not smoke. Avoid secondhand smoke.  GET HELP RIGHT AWAY IF:    You have a temperature by mouth above 102 F (38.9 C), not controlled by medicine.     You have chills.     You develop severe shortness of breath or chest pain.     You have bloody spit mixed with mucus (sputum).     You throw up (vomit) often.     You lose too much body fluid (dehydrated).     You have a severe headache.     You feel faint.     You do not improve after 1 week of treatment.  MAKE SURE YOU:    Understand these instructions.     Will watch your condition.     Will get help right away if you are not doing well or get worse.  Document Released: 09/23/2007 Document Revised: 12/17/2010 Document Reviewed: 04/24/2009 [facility] Patient Information 2012 [place], [place].

## 2011-02-27 ENCOUNTER — Ambulatory Visit (INDEPENDENT_AMBULATORY_CARE_PROVIDER_SITE_OTHER): Payer: Medicare Other | Admitting: Internal Medicine

## 2011-02-27 ENCOUNTER — Encounter: Payer: Self-pay | Admitting: Internal Medicine

## 2011-02-27 VITALS — BP 128/80 | HR 70 | Temp 97.7°F | Resp 14 | Wt 202.0 lb

## 2011-02-27 DIAGNOSIS — R05 Cough: Secondary | ICD-10-CM

## 2011-02-27 DIAGNOSIS — R059 Cough, unspecified: Secondary | ICD-10-CM

## 2011-03-02 NOTE — Progress Notes (Signed)
  Subjective:    Patient ID: [name] Good, male    DOB: [birth year], 70 y.o.   MRN: [id number]  HPI Mr. Cossin was seen recently for a viral respiratory infection and was instructed in the care of his cough. He was given an Advair sample for use to reduce his wheezing. He reports that he is doing better but is still coughing and has the sensation of a quivering in the left chest. He is concerned for a more ominous pulmonary problem. He has had no fevers, chills or productive sputum, no SOB/DOE  I have reviewed the patient's medical history in detail and updated the computerized patient record.     Review of Systems System review is negative for any constitutional, cardiac, pulmonary, GI or neuro symptoms or complaints other than as described in the HPI.     Objective:   Physical Exam Vitals noted - afebrile, O2 sat normal Gen'l - WNWD AA man in no distress Pulm - no increased work of breathing, no wheezing, normal pectoriloquy or change in fremitus, good BS       Assessment & Plan:  Cough w/o respiratory distress - post-infectious cough that is minor. Chest wall is normal  Plan - antitussive of choice.           May continue Advair until gone.

## 2011-03-03 ENCOUNTER — Ambulatory Visit (INDEPENDENT_AMBULATORY_CARE_PROVIDER_SITE_OTHER): Payer: Medicare Other

## 2011-03-03 DIAGNOSIS — Z23 Encounter for immunization: Secondary | ICD-10-CM

## 2011-05-11 DIAGNOSIS — H43399 Other vitreous opacities, unspecified eye: Secondary | ICD-10-CM | POA: Diagnosis not present

## 2011-06-08 ENCOUNTER — Other Ambulatory Visit: Payer: Self-pay | Admitting: Internal Medicine

## 2011-06-30 DIAGNOSIS — H409 Unspecified glaucoma: Secondary | ICD-10-CM | POA: Diagnosis not present

## 2011-06-30 DIAGNOSIS — H26499 Other secondary cataract, unspecified eye: Secondary | ICD-10-CM | POA: Diagnosis not present

## 2011-06-30 DIAGNOSIS — H4011X0 Primary open-angle glaucoma, stage unspecified: Secondary | ICD-10-CM | POA: Diagnosis not present

## 2011-10-05 ENCOUNTER — Other Ambulatory Visit: Payer: Self-pay | Admitting: Internal Medicine

## 2011-10-10 ENCOUNTER — Other Ambulatory Visit: Payer: Self-pay | Admitting: Internal Medicine

## 2011-10-29 ENCOUNTER — Ambulatory Visit (INDEPENDENT_AMBULATORY_CARE_PROVIDER_SITE_OTHER): Payer: Medicare Other | Admitting: Internal Medicine

## 2011-10-29 ENCOUNTER — Encounter: Payer: Self-pay | Admitting: Internal Medicine

## 2011-10-29 ENCOUNTER — Other Ambulatory Visit (INDEPENDENT_AMBULATORY_CARE_PROVIDER_SITE_OTHER): Payer: Medicare Other

## 2011-10-29 VITALS — BP 136/80 | HR 84 | Temp 98.1°F | Resp 16 | Ht 72.0 in | Wt 200.0 lb

## 2011-10-29 DIAGNOSIS — M1990 Unspecified osteoarthritis, unspecified site: Secondary | ICD-10-CM

## 2011-10-29 DIAGNOSIS — T887XXA Unspecified adverse effect of drug or medicament, initial encounter: Secondary | ICD-10-CM | POA: Diagnosis not present

## 2011-10-29 DIAGNOSIS — Z125 Encounter for screening for malignant neoplasm of prostate: Secondary | ICD-10-CM

## 2011-10-29 DIAGNOSIS — R011 Cardiac murmur, unspecified: Secondary | ICD-10-CM

## 2011-10-29 DIAGNOSIS — H547 Unspecified visual loss: Secondary | ICD-10-CM

## 2011-10-29 DIAGNOSIS — I10 Essential (primary) hypertension: Secondary | ICD-10-CM | POA: Diagnosis not present

## 2011-10-29 DIAGNOSIS — E785 Hyperlipidemia, unspecified: Secondary | ICD-10-CM | POA: Diagnosis not present

## 2011-10-29 DIAGNOSIS — M353 Polymyalgia rheumatica: Secondary | ICD-10-CM | POA: Diagnosis not present

## 2011-10-29 DIAGNOSIS — IMO0001 Reserved for inherently not codable concepts without codable children: Secondary | ICD-10-CM

## 2011-10-29 DIAGNOSIS — Z0000 Encounter for general adult medical examination without abnormal findings: Secondary | ICD-10-CM | POA: Diagnosis not present

## 2011-10-29 LAB — CBC WITH DIFFERENTIAL/PLATELET
Basophils Relative: 1.8 % (ref 0.0–3.0)
Eosinophils Relative: 3.9 % (ref 0.0–5.0)
HCT: 41.6 % (ref 39.0–52.0)
Hemoglobin: 13.7 g/dL (ref 13.0–17.0)
Lymphs Abs: 1.5 10*3/uL (ref 0.7–4.0)
Monocytes Relative: 16.6 % — ABNORMAL HIGH (ref 3.0–12.0)
Neutro Abs: 1.6 10*3/uL (ref 1.4–7.7)
RBC: 4.67 Mil/uL (ref 4.22–5.81)
WBC: 4.1 10*3/uL — ABNORMAL LOW (ref 4.5–10.5)

## 2011-10-29 LAB — LDL CHOLESTEROL, DIRECT: Direct LDL: 128.1 mg/dL

## 2011-10-29 LAB — COMPREHENSIVE METABOLIC PANEL
ALT: 22 U/L (ref 0–53)
AST: 24 U/L (ref 0–37)
Albumin: 4.3 g/dL (ref 3.5–5.2)
Alkaline Phosphatase: 53 U/L (ref 39–117)
Calcium: 10 mg/dL (ref 8.4–10.5)
Chloride: 105 mEq/L (ref 96–112)
Potassium: 5.8 mEq/L — ABNORMAL HIGH (ref 3.5–5.1)

## 2011-10-29 LAB — LIPID PANEL
Total CHOL/HDL Ratio: 3
VLDL: 30 mg/dL (ref 0.0–40.0)

## 2011-10-29 LAB — SEDIMENTATION RATE: Sed Rate: 17 mm/hr (ref 0–22)

## 2011-10-29 NOTE — Progress Notes (Signed)
Subjective:    Patient ID: [name] Good, male    DOB: [birth year], 71 y.o.   MRN: [id number]  HPI Mr. Casher is here for annual Medicare wellness examination and management of other chronic and acute problems. He has been well in the interval since his last visit. He reports that he has improved cervical mobility having stopped lovastatin. He also has more energy.    The risk factors are reflected in the social history.  The roster of all physicians providing medical care to patient - is listed in the Snapshot section of the chart.  Activities of daily living:  The patient is 100% inedpendent in all ADLs: dressing, toileting, feeding as well as independent mobility  Home safety : The patient has smoke detectors in the home. They wear seatbelts. Falls -will work on fall risk/home safety.  firearms are present in the home, kept in a safe fashion. There is no violence in the home.   There is no risks for hepatitis, STDs or HIV. There is no   history of blood transfusion. They have no travel history to infectious disease endemic areas of the world.  The patient has seen their dentist in the last six month. They have seen their eye doctor in the last year. They deny any hearing difficulty and have not had audiologic testing in the last year.  They do not  have excessive sun exposure. Discussed the need for sun protection: hats, long sleeves and use of sunscreen if there is significant sun exposure.   Diet: the importance of a healthy diet is discussed. They do have a healthy diet.  The patient has a regular exercise program: walking/fitness machines , 60 min duration, 4-5 per week.  The benefits of regular aerobic exercise were discussed.  Depression screen: there are no signs or vegative symptoms of depression- irritability, change in appetite, anhedonia, sadness/tearfullness.  Cognitive assessment: the patient manages all their financial and personal affairs and is actively engaged.     The following portions of the patient's history were reviewed and updated as appropriate: allergies, current medications, past family history, past medical history,  past surgical history, past social history  and problem list.  Vision, hearing, body mass index were assessed and reviewed.   During the course of the visit the patient was educated and counseled about appropriate screening and preventive services including : fall prevention , diabetes screening, nutrition counseling, colorectal cancer screening, and recommended immunizations.  Past Medical History  Diagnosis Date  . Hypertension   . Hyperlipidemia   . Balanitis     hx of  . Osteoarthritis     diffuse   Past Surgical History  Procedure Date  . Cataract extraction   . Tonsillectomy    Family History  Problem Relation Age of Onset  . Other Mother 74    CVA  . Cancer Father     lung  . Diabetes Brother   . Coronary artery disease Neg Hx    History   Social History  . Marital Status: Married    Spouse Name: N/A    Number of Children: 0  . Years of Education: 17   Occupational History  . PASTOR    Social History Main Topics  . Smoking status: Former Smoker    Types: Cigarettes    Quit date: 04/20/1977  . Smokeless tobacco: Never Used  . Alcohol Use: No  . Drug Use: No  . Sexually Active: Yes -- Male partner(s)   Other Topics  Concern  . Not on file   Social History Narrative   HSG, Micheline Rough, [place]. Married '62.    No children. Work - Engineer, production. Lives with wife - iADLs. End of Life Care - provided packet (July '12)    Current Outpatient Prescriptions on File Prior to Visit  Medication Sig Dispense Refill  . aspirin 81 MG tablet Take 81 mg by mouth 2 (two) times daily.        . brimonidine (ALPHAGAN P) 0.1 % SOLN as directed.        . clobetasol (TEMOVATE) 0.05 % ointment Apply topically as needed.        . Clobetasol Propionate (CLOBEX SPRAY) 0.05 % external spray Apply  topically as needed.        Marland Kitchen lisinopril (PRINIVIL,ZESTRIL) 20 MG tablet TAKE 1 TABLET BY MOUTH ONCE DAILY  60 tablet  5  . multivitamin (THERAGRAN) tablet Take 1 tablet by mouth daily.        Marland Kitchen lovastatin (MEVACOR) 20 MG tablet TAKE 1 TABLET BY MOUTH EVERY EVENING FOR CHOLESTEROL CONTROL  30 tablet  5     Review of Systems Constitutional:  Negative for fever, chills, activity change and unexpected weight change.  HEENT:  Negative for hearing loss, ear pain, congestion, neck stiffness and postnasal drip. Negative for sore throat or swallowing problems. Negative for dental complaints.   Eyes: Negative for vision loss or change in visual acuity.  Respiratory: Negative for chest tightness and wheezing. Negative for DOE.   Cardiovascular: Negative for chest pain or palpitations. No decreased exercise tolerance Gastrointestinal: No change in bowel habit. No bloating or gas. No reflux or indigestion Genitourinary: Negative for urgency, frequency, flank pain and difficulty urinating. Impotence-does not get erection. Musculoskeletal: Negative for myalgias, back pain, arthralgias and gait problem.  Neurological: Negative for dizziness, tremors, weakness and headaches.  Hematological: Negative for adenopathy.  Psychiatric/Behavioral: Negative for behavioral problems and dysphoric mood.       Objective:   Physical Exam Filed Vitals:   10/29/11 0917  BP: 136/80  Pulse: 84  Temp: 98.1 F (36.7 C)  Resp: 16   Wt Readings from Last 3 Encounters:  10/29/11 200 lb (90.719 kg)  02/27/11 202 lb (91.627 kg)  02/16/11 202 lb (91.627 kg)    Gen'l: Well nourished well developed AA male in no acute distress  HEENT: Head: Normocephalic and atraumatic. Right Ear: External ear normal. EAC-mild amt cerumen/TM nl. Left Ear: External ear normal.  EAC/TM nl. Nose: Nose normal. Mouth/Throat: Oropharynx is clear and moist. Dentition - native, in good repair. No buccal or palatal lesions. Posterior pharynx  clear. Eyes: Conjunctivae and sclera clear. EOM intact. Pupils are equal, round, and reactive to light. Right eye exhibits no discharge. Left eye exhibits no discharge. Neck: Normal range of motion. Neck supple. No JVD present. No tracheal deviation present. No thyromegaly present.  Cardiovascular: Normal rate, regular rhythm, no gallop, no friction rub. II/VI systolic murmur heard at LSB.      Quiet precordium. 2+ radial and DP pulses . No carotid bruits Pulmonary/Chest: Effort normal. No respiratory distress or increased WOB, no wheezes, no rales. No chest wall deformity or CVAT. Abdominal: Soft. Bowel sounds are normal in all quadrants. He exhibits no distension, no tenderness, no rebound or guarding, No heptosplenomegaly  Genitourinary: deferred   Musculoskeletal: Normal range of motion. He exhibits no edema and no tenderness.       Small and large joints without redness, synovial thickening or deformity.  Full range of motion preserved about all small, median and large joints.  Lymphadenopathy:    He has no cervical or supraclavicular adenopathy.  Neurological: He is alert and oriented to person, place, and time. CN II-XII intact. DTRs 2+ and symmetrical biceps, radial and patellar tendons. Cerebellar function normal with no tremor, rigidity, normal gait and station.  Skin: Skin is warm and dry. No rash noted. No erythema.  Psychiatric: He has a normal mood and affect. His behavior is normal. Thought content normal.   Lab Results  Component Value Date   WBC 4.1* 10/29/2011   HGB 13.7 10/29/2011   HCT 41.6 10/29/2011   PLT 190.0 10/29/2011   GLUCOSE 96 10/29/2011   CHOL 224* 10/29/2011   TRIG 150.0* 10/29/2011   HDL 64.40 10/29/2011   LDLDIRECT 128.1 10/29/2011   LDLCALC 89 10/24/2010   ALT 22 10/29/2011   AST 24 10/29/2011   NA 141 10/29/2011   K 5.8* 10/29/2011   CL 105 10/29/2011   CREATININE 1.1 10/29/2011   BUN 16 10/29/2011   CO2 31 10/29/2011   TSH 1.10 10/24/2010   PSA 1.77 09/02/2009          Assessment & Plan:

## 2011-10-31 NOTE — Assessment & Plan Note (Signed)
[name] Good reports significant improvement since stopping "statin" medication.  Plan-  Will not resume "Statins."  Continue to be active; flex/stretch exercise

## 2011-10-31 NOTE — Assessment & Plan Note (Signed)
Stable with minor restrictions in activities. No joint deformity or loss of function.

## 2011-10-31 NOTE — Assessment & Plan Note (Signed)
Real improvement in myalgias since stopping "Statins."  ESR 17 - normal range. No evidence of active PMR  Plan No treatment. Will avoid "statins."

## 2011-10-31 NOTE — Assessment & Plan Note (Signed)
Interval medical history is without events or changes. Physical exam is stable. Lab results are in normal range. He is current with colorectal cancer screening. At 59 further prostate cancer screening is not recommended. His last PSA @1 .77 was normal. He is current with immunizations except for Shingles.  In summary - a very nice man who is medically stable. He will address lipid management with life-style and herbal treatment. He will will return as needed or in 1 year.

## 2011-10-31 NOTE — Assessment & Plan Note (Signed)
LDL is 128 - goal is 100 due to CT evidence of prior lacunar infarct, otherwise in the absence of heart disease he would be at goal of 130 or less. Framingham Cardiac risk calculation gives a 10 year risk of a cardiac event as 16%. Additional calculations - lowering total cholesterol to 200 risk is 16%; lowering total cholesterol to 200 and lowering systolic blood pressure to 120 risk is 14%. Thus, there is limited additional benefit to lowering cholesterol in regard to risk of a cardiac event.  Plan- Continue healthy life-style: low fat diet, regular aerobic exercise and weight management  Trial of herbal Red Yeast Rice

## 2011-10-31 NOTE — Assessment & Plan Note (Signed)
No change in visual acuity.

## 2011-10-31 NOTE — Assessment & Plan Note (Signed)
BP Readings from Last 3 Encounters:  10/29/11 136/80  02/27/11 128/80  02/16/11 132/84   Adequate control on present medical therapy - no change at this time.

## 2011-11-06 ENCOUNTER — Ambulatory Visit (HOSPITAL_COMMUNITY): Payer: Medicare Other | Attending: Cardiovascular Disease | Admitting: Radiology

## 2011-11-06 DIAGNOSIS — E785 Hyperlipidemia, unspecified: Secondary | ICD-10-CM | POA: Diagnosis not present

## 2011-11-06 DIAGNOSIS — Z87891 Personal history of nicotine dependence: Secondary | ICD-10-CM | POA: Insufficient documentation

## 2011-11-06 DIAGNOSIS — R011 Cardiac murmur, unspecified: Secondary | ICD-10-CM | POA: Diagnosis not present

## 2011-11-06 DIAGNOSIS — I517 Cardiomegaly: Secondary | ICD-10-CM | POA: Diagnosis not present

## 2011-11-06 DIAGNOSIS — I10 Essential (primary) hypertension: Secondary | ICD-10-CM | POA: Diagnosis not present

## 2011-11-06 NOTE — Progress Notes (Signed)
Echocardiogram performed.  

## 2011-11-09 ENCOUNTER — Encounter: Payer: Self-pay | Admitting: Internal Medicine

## 2012-02-09 ENCOUNTER — Ambulatory Visit (INDEPENDENT_AMBULATORY_CARE_PROVIDER_SITE_OTHER): Payer: Medicare Other

## 2012-02-09 DIAGNOSIS — Z23 Encounter for immunization: Secondary | ICD-10-CM

## 2012-07-18 DIAGNOSIS — H4011X0 Primary open-angle glaucoma, stage unspecified: Secondary | ICD-10-CM | POA: Diagnosis not present

## 2012-07-18 DIAGNOSIS — H43819 Vitreous degeneration, unspecified eye: Secondary | ICD-10-CM | POA: Diagnosis not present

## 2012-07-18 DIAGNOSIS — H409 Unspecified glaucoma: Secondary | ICD-10-CM | POA: Diagnosis not present

## 2012-09-29 ENCOUNTER — Other Ambulatory Visit: Payer: Self-pay | Admitting: Internal Medicine

## 2012-10-18 DIAGNOSIS — H43819 Vitreous degeneration, unspecified eye: Secondary | ICD-10-CM | POA: Diagnosis not present

## 2012-10-18 DIAGNOSIS — H47239 Glaucomatous optic atrophy, unspecified eye: Secondary | ICD-10-CM | POA: Diagnosis not present

## 2012-10-18 DIAGNOSIS — H409 Unspecified glaucoma: Secondary | ICD-10-CM | POA: Diagnosis not present

## 2012-11-02 ENCOUNTER — Ambulatory Visit (INDEPENDENT_AMBULATORY_CARE_PROVIDER_SITE_OTHER): Payer: Medicare Other | Admitting: Internal Medicine

## 2012-11-02 ENCOUNTER — Other Ambulatory Visit (INDEPENDENT_AMBULATORY_CARE_PROVIDER_SITE_OTHER): Payer: Medicare Other

## 2012-11-02 ENCOUNTER — Encounter: Payer: Self-pay | Admitting: Internal Medicine

## 2012-11-02 VITALS — BP 150/90 | HR 76 | Temp 96.7°F | Wt 197.0 lb

## 2012-11-02 DIAGNOSIS — Z0000 Encounter for general adult medical examination without abnormal findings: Secondary | ICD-10-CM | POA: Diagnosis not present

## 2012-11-02 DIAGNOSIS — E785 Hyperlipidemia, unspecified: Secondary | ICD-10-CM

## 2012-11-02 DIAGNOSIS — M353 Polymyalgia rheumatica: Secondary | ICD-10-CM | POA: Diagnosis not present

## 2012-11-02 DIAGNOSIS — H547 Unspecified visual loss: Secondary | ICD-10-CM

## 2012-11-02 DIAGNOSIS — I10 Essential (primary) hypertension: Secondary | ICD-10-CM

## 2012-11-02 LAB — COMPREHENSIVE METABOLIC PANEL
ALT: 23 U/L (ref 0–53)
AST: 22 U/L (ref 0–37)
Albumin: 4.1 g/dL (ref 3.5–5.2)
Calcium: 9.4 mg/dL (ref 8.4–10.5)
Chloride: 104 mEq/L (ref 96–112)
Potassium: 4.7 mEq/L (ref 3.5–5.1)
Sodium: 140 mEq/L (ref 135–145)

## 2012-11-02 LAB — CBC WITH DIFFERENTIAL/PLATELET
Basophils Relative: 1.3 % (ref 0.0–3.0)
Eosinophils Relative: 6.6 % — ABNORMAL HIGH (ref 0.0–5.0)
MCV: 87.6 fl (ref 78.0–100.0)
Monocytes Absolute: 0.4 10*3/uL (ref 0.1–1.0)
Neutrophils Relative %: 40.1 % — ABNORMAL LOW (ref 43.0–77.0)
RBC: 4.78 Mil/uL (ref 4.22–5.81)
WBC: 3.6 10*3/uL — ABNORMAL LOW (ref 4.5–10.5)

## 2012-11-02 LAB — LIPID PANEL: HDL: 55.6 mg/dL (ref >39.00)

## 2012-11-02 MED ORDER — ASPIRIN 81 MG PO TABS: 81.0000 mg | ORAL_TABLET | Freq: Every day | ORAL | Status: DC

## 2012-11-02 NOTE — Progress Notes (Signed)
Subjective:    Patient ID: [name] Good, male    DOB: Dec 16, 1940, 72 y.o.   MRN: [id number]  HPI The patient is here for annual Medicare wellness examination and management of other chronic and acute problems.   Interval history: benign. Glaucoma is progressive.   The risk factors are reflected in the social history.  The roster of all physicians providing medical care to patient - is listed in the Snapshot section of the chart.  Activities of daily living:  The patient is 100% inedpendent in all ADLs: dressing, toileting, feeding as well as independent mobility  Home safety : The patient has smoke detectors in the home. Falls - none. Home is not fall safe. They wear seatbelts. firearms are present in the home, kept in a safe fashion. There is no violence in the home.   There is no risks for hepatitis, STDs or HIV. There is no   history of blood transfusion. They have no travel history to infectious disease endemic areas of the world.  The patient has seen their dentist in the last six month. They have seen their eye doctor in the last year. They deny any hearing difficulty and have not had audiologic testing in the last year.    They do not  have excessive sun exposure. Discussed the need for sun protection: hats, long sleeves and use of sunscreen if there is significant sun exposure.   Diet: the importance of a healthy diet is discussed. They do have a healthy diet.  The patient has a regular exercise program: aerobics and fitness ,  55 min duration, 3 per week.  The benefits of regular aerobic exercise were discussed.  Depression screen: there are no signs or vegative symptoms of depression- irritability, change in appetite, anhedonia, sadness/tearfullness.  Cognitive assessment: the patient manages all their financial and personal affairs and is actively engaged.   The following portions of the patient's history were reviewed and updated as appropriate: allergies, current  medications, past family history, past medical history,  past surgical history, past social history  and problem list.  Vision, hearing, body mass index were assessed and reviewed.   During the course of the visit the patient was educated and counseled about appropriate screening and preventive services including : fall prevention , diabetes screening, nutrition counseling, colorectal cancer screening, and recommended immunizations.  Past Medical History  Diagnosis Date  . Hypertension   . Hyperlipidemia   . Balanitis     hx of  . Osteoarthritis     diffuse   Past Surgical History  Procedure Laterality Date  . Cataract extraction    . Tonsillectomy     Family History  Problem Relation Age of Onset  . Other Mother 1    CVA  . Cancer Father     lung  . Diabetes Brother   . Coronary artery disease Neg Hx    History   Social History  . Marital Status: Married    Spouse Name: N/A    Number of Children: 0  . Years of Education: 61   Occupational History  . PASTOR    Social History Main Topics  . Smoking status: Former Smoker    Types: Cigarettes    Quit date: 04/20/1977  . Smokeless tobacco: Never Used  . Alcohol Use: No  . Drug Use: No  . Sexually Active: Yes -- Male partner(s)   Other Topics Concern  . Not on file   Social History Narrative   HSG,  Micheline Rough, [place]. Married '62.    No children. Work - Engineer, production. Lives with wife - iADLs. End of Life Care - provided packet (July '12)    Current Outpatient Prescriptions on File Prior to Visit  Medication Sig Dispense Refill  . brimonidine (ALPHAGAN P) 0.1 % SOLN as directed.        . clobetasol (TEMOVATE) 0.05 % ointment Apply topically as needed.        . Clobetasol Propionate (CLOBEX SPRAY) 0.05 % external spray Apply topically as needed.        Marland Kitchen lisinopril (PRINIVIL,ZESTRIL) 20 MG tablet take 1 tablet by mouth once daily  60 tablet  0  . multivitamin (THERAGRAN) tablet Take 1 tablet by  mouth daily.         No current facility-administered medications on file prior to visit.      Review of Systems Constitutional:  Negative for fever, chills, activity change and unexpected weight change.  HEENT:  Negative for hearing loss, ear pain, congestion, neck stiffness and postnasal drip. Negative for sore throat or swallowing problems. Negative for dental complaints.   Eyes: Negative for vision loss or change in visual acuity.  Respiratory: Negative for chest tightness and wheezing. Negative for DOE.   Cardiovascular: Negative for chest pain or palpitations. No decreased exercise tolerance Gastrointestinal: No change in bowel habit. No bloating or gas. No reflux or indigestion Genitourinary: Negative for urgency, frequency, flank pain and difficulty urinating.  Musculoskeletal: Negative for myalgias, back pain, arthralgias and gait problem.  Neurological: Negative for dizziness, tremors, weakness and headaches.  Hematological: Negative for adenopathy.  Psychiatric/Behavioral: Negative for behavioral problems and dysphoric mood.       Objective:   Physical Exam Filed Vitals:   11/02/12 0908  BP: 150/90  Pulse: 76  Temp: 96.7 F (35.9 C)   Wt Readings from Last 3 Encounters:  11/02/12 197 lb (89.359 kg)  10/29/11 200 lb (90.719 kg)  02/27/11 202 lb (91.627 kg)   Gen'l: Well nourished well developed AA male in no acute distress  HEENT: Head: Normocephalic and atraumatic. Right Ear: External ear normal. EAC/TM nl. Left Ear: External ear normal.  EAC/TM nl. Nose: Nose normal. Mouth/Throat: Oropharynx is clear and moist. Dentition - native, in good repair. No buccal or palatal lesions. Posterior pharynx clear. Eyes: Conjunctivae and sclera clear. EOM intact. Pupils are equal, round. Exam otherwise deferred to ophthalmology. Neck: Normal range of motion. Neck supple. No JVD present. No tracheal deviation present. No thyromegaly present.  Cardiovascular: Normal rate, regular  rhythm, no gallop, no friction rub, no murmur heard.      Quiet precordium. 2+ radial and DP pulses . No carotid bruits Pulmonary/Chest: Effort normal. No respiratory distress or increased WOB, no wheezes, no rales. No chest wall deformity or CVAT. Abdomen: Soft. Bowel sounds are normal in all quadrants. He exhibits no distension, no tenderness, no rebound or guarding, No heptosplenomegaly  Genitourinary:  deferred Musculoskeletal: Normal range of motion. He exhibits no edema and no tenderness.       Small and large joints without redness, synovial thickening or deformity. Full range of motion preserved about all small, median and large joints.  Lymphadenopathy:    He has no cervical or supraclavicular adenopathy.  Neurological: He is alert and oriented to person, place, and time. CN II-XII intact. DTRs 2+ and symmetrical biceps, radial and patellar tendons. Cerebellar function normal with no tremor, rigidity, normal gait and station.  Skin: Skin is warm and dry. No  rash noted. No erythema.  Psychiatric: He has a normal mood and affect. His behavior is normal. Thought content normal.   Recent Results (from the past 2160 hour(s))  COMPREHENSIVE METABOLIC PANEL     Status: None   Collection Time    11/02/12  9:59 AM      Result Value Range   Sodium 140  135 - 145 mEq/L   Potassium 4.7  3.5 - 5.1 mEq/L   Chloride 104  96 - 112 mEq/L   CO2 31  19 - 32 mEq/L   Glucose, Bld 96  70 - 99 mg/dL   BUN 17  6 - 23 mg/dL   Creatinine, Ser 1.1  0.4 - 1.5 mg/dL   Total Bilirubin 0.7  0.3 - 1.2 mg/dL   Alkaline Phosphatase 53  39 - 117 U/L   AST 22  0 - 37 U/L   ALT 23  0 - 53 U/L   Total Protein 7.2  6.0 - 8.3 g/dL   Albumin 4.1  3.5 - 5.2 g/dL   Calcium 9.4  8.4 - 95.6 mg/dL   GFR 21.30  >86.57 mL/min  LIPID PANEL     Status: Abnormal   Collection Time    11/02/12  9:59 AM      Result Value Range   Cholesterol 213 (*) 0 - 200 mg/dL   Comment: ATP III Classification       Desirable:  < 200  mg/dL               Borderline High:  200 - 239 mg/dL          High:  > = 846 mg/dL   Triglycerides 962.9 (*) 0.0 - 149.0 mg/dL   Comment: Normal:  <528 mg/dLBorderline High:  150 - 199 mg/dL   HDL 41.32  >44.01 mg/dL   VLDL 02.7  0.0 - 25.3 mg/dL   Total CHOL/HDL Ratio 4     Comment:                Men          Women1/2 Average Risk     3.4          3.3Average Risk          5.0          4.42X Average Risk          9.6          7.13X Average Risk          15.0          11.0                      CBC WITH DIFFERENTIAL     Status: Abnormal   Collection Time    11/02/12  9:59 AM      Result Value Range   WBC 3.6 (*) 4.5 - 10.5 K/uL   RBC 4.78  4.22 - 5.81 Mil/uL   Hemoglobin 14.2  13.0 - 17.0 g/dL   HCT 66.4  40.3 - 47.4 %   MCV 87.6  78.0 - 100.0 fl   MCHC 33.8  30.0 - 36.0 g/dL   RDW 25.9  56.3 - 87.5 %   Platelets 209.0  150.0 - 400.0 K/uL   Neutrophils Relative % 40.1 (*) 43.0 - 77.0 %   Lymphocytes Relative 39.5  12.0 - 46.0 %   Monocytes Relative 12.5 (*) 3.0 - 12.0 %   Eosinophils Relative  6.6 (*) 0.0 - 5.0 %   Basophils Relative 1.3  0.0 - 3.0 %   Neutro Abs 1.4  1.4 - 7.7 K/uL   Lymphs Abs 1.4  0.7 - 4.0 K/uL   Monocytes Absolute 0.4  0.1 - 1.0 K/uL   Eosinophils Absolute 0.2  0.0 - 0.7 K/uL   Basophils Absolute 0.0  0.0 - 0.1 K/uL  LDL CHOLESTEROL, DIRECT     Status: None   Collection Time    11/02/12  9:59 AM      Result Value Range   Direct LDL 136.6     Comment: Optimal:  <100 mg/dLNear or Above Optimal:  100-129 mg/dLBorderline High:  130-159 mg/dLHigh:  160-189 mg/dLVery High:  >190 mg/dL         Assessment & Plan:

## 2012-11-02 NOTE — Patient Instructions (Addendum)
It is good to see you.  Your exam is fine. Will get labs today and results will be available on MyChart - please sign up today.  BP was a bit elevated - please check at home and report back to me - via MyChart.  Nighttime urination - BPH. Trial of Rapaflo once a day. If this works will Rx tamsulosin  There are good drugs to help slow down memory loss - you need to have the Mrs. Evaluated.  Convertible therapy is good for the spirit but you might get darker.

## 2012-11-03 NOTE — Assessment & Plan Note (Signed)
Progressive vision loss. He does remain independent, does drive and can read.  Plan Per ophthalmology

## 2012-11-03 NOTE — Assessment & Plan Note (Signed)
Lab reveals LDL is close to goal of 130 or less.  Plan Better dietary adherence - low fat plus regular exercise.  Repeat lab in 1 year

## 2012-11-03 NOTE — Assessment & Plan Note (Signed)
BP Readings from Last 3 Encounters:  11/02/12 150/90  10/29/11 136/80  02/27/11 128/80   BP mildly elevated today.  Plan  Continue present medications   Monitor BP at home - call for SBP that are consistently 150 +

## 2012-11-03 NOTE — Assessment & Plan Note (Signed)
No c/o  generalized pain at today's visit.  Plan  stressed the importance of regular stretch - flex exercise.

## 2012-11-03 NOTE — Assessment & Plan Note (Signed)
Interval history is unremarkable except for progressive vision loss. Physical exam is unremarkable. Lab results reviewed: renal, liver function normal, lytes normal. He is current with colorectal cancer screening and has aged out of prostate cancer screening. Immunizations are current except for shingles vaccine- he is asked to check on insurance coverage.  In summary A very nice man who appears medically stable. He will return as needed or in 1 year.

## 2012-11-21 DIAGNOSIS — Z961 Presence of intraocular lens: Secondary | ICD-10-CM | POA: Diagnosis not present

## 2012-12-05 ENCOUNTER — Other Ambulatory Visit: Payer: Self-pay | Admitting: Internal Medicine

## 2012-12-06 ENCOUNTER — Encounter (INDEPENDENT_AMBULATORY_CARE_PROVIDER_SITE_OTHER): Payer: Medicare Other | Admitting: Ophthalmology

## 2012-12-06 DIAGNOSIS — H35039 Hypertensive retinopathy, unspecified eye: Secondary | ICD-10-CM

## 2012-12-06 DIAGNOSIS — I10 Essential (primary) hypertension: Secondary | ICD-10-CM | POA: Diagnosis not present

## 2012-12-06 DIAGNOSIS — H33309 Unspecified retinal break, unspecified eye: Secondary | ICD-10-CM | POA: Diagnosis not present

## 2012-12-06 DIAGNOSIS — H35419 Lattice degeneration of retina, unspecified eye: Secondary | ICD-10-CM

## 2012-12-06 DIAGNOSIS — H3530 Unspecified macular degeneration: Secondary | ICD-10-CM

## 2012-12-06 DIAGNOSIS — H43819 Vitreous degeneration, unspecified eye: Secondary | ICD-10-CM

## 2012-12-13 ENCOUNTER — Encounter (INDEPENDENT_AMBULATORY_CARE_PROVIDER_SITE_OTHER): Payer: Medicare Other | Admitting: Ophthalmology

## 2012-12-13 DIAGNOSIS — H33309 Unspecified retinal break, unspecified eye: Secondary | ICD-10-CM

## 2012-12-15 ENCOUNTER — Telehealth: Payer: Self-pay | Admitting: *Deleted

## 2012-12-15 DIAGNOSIS — M1990 Unspecified osteoarthritis, unspecified site: Secondary | ICD-10-CM

## 2012-12-15 NOTE — Telephone Encounter (Signed)
Pt called requesting a referral to [facility] [phone] for treatment of a swollen left knee.  Please advise

## 2012-12-16 NOTE — Telephone Encounter (Signed)
Spoke with pt advised referral has been placed  

## 2012-12-20 DIAGNOSIS — R269 Unspecified abnormalities of gait and mobility: Secondary | ICD-10-CM | POA: Diagnosis not present

## 2012-12-20 DIAGNOSIS — M25569 Pain in unspecified knee: Secondary | ICD-10-CM | POA: Diagnosis not present

## 2012-12-21 ENCOUNTER — Ambulatory Visit (INDEPENDENT_AMBULATORY_CARE_PROVIDER_SITE_OTHER): Payer: Medicare Other | Admitting: Ophthalmology

## 2012-12-21 DIAGNOSIS — H33309 Unspecified retinal break, unspecified eye: Secondary | ICD-10-CM

## 2013-01-27 ENCOUNTER — Other Ambulatory Visit: Payer: Self-pay | Admitting: Internal Medicine

## 2013-02-14 ENCOUNTER — Ambulatory Visit (INDEPENDENT_AMBULATORY_CARE_PROVIDER_SITE_OTHER): Payer: Medicare Other

## 2013-02-14 DIAGNOSIS — Z23 Encounter for immunization: Secondary | ICD-10-CM | POA: Diagnosis not present

## 2013-02-22 DIAGNOSIS — H43819 Vitreous degeneration, unspecified eye: Secondary | ICD-10-CM | POA: Diagnosis not present

## 2013-02-22 DIAGNOSIS — H4011X0 Primary open-angle glaucoma, stage unspecified: Secondary | ICD-10-CM | POA: Diagnosis not present

## 2013-02-22 DIAGNOSIS — H47239 Glaucomatous optic atrophy, unspecified eye: Secondary | ICD-10-CM | POA: Diagnosis not present

## 2013-02-22 DIAGNOSIS — H409 Unspecified glaucoma: Secondary | ICD-10-CM | POA: Diagnosis not present

## 2013-03-10 DIAGNOSIS — L408 Other psoriasis: Secondary | ICD-10-CM | POA: Diagnosis not present

## 2013-03-10 DIAGNOSIS — L219 Seborrheic dermatitis, unspecified: Secondary | ICD-10-CM | POA: Diagnosis not present

## 2013-03-23 ENCOUNTER — Other Ambulatory Visit: Payer: Self-pay | Admitting: Internal Medicine

## 2013-04-18 ENCOUNTER — Encounter (INDEPENDENT_AMBULATORY_CARE_PROVIDER_SITE_OTHER): Payer: Medicare Other | Admitting: Ophthalmology

## 2013-04-18 DIAGNOSIS — H35039 Hypertensive retinopathy, unspecified eye: Secondary | ICD-10-CM

## 2013-04-18 DIAGNOSIS — H43819 Vitreous degeneration, unspecified eye: Secondary | ICD-10-CM

## 2013-04-18 DIAGNOSIS — H33309 Unspecified retinal break, unspecified eye: Secondary | ICD-10-CM | POA: Diagnosis not present

## 2013-04-18 DIAGNOSIS — I10 Essential (primary) hypertension: Secondary | ICD-10-CM | POA: Diagnosis not present

## 2013-04-27 ENCOUNTER — Encounter (INDEPENDENT_AMBULATORY_CARE_PROVIDER_SITE_OTHER): Payer: Medicare Other | Admitting: Ophthalmology

## 2013-04-28 ENCOUNTER — Ambulatory Visit (INDEPENDENT_AMBULATORY_CARE_PROVIDER_SITE_OTHER): Payer: Medicare Other | Admitting: Ophthalmology

## 2013-07-19 DIAGNOSIS — H3530 Unspecified macular degeneration: Secondary | ICD-10-CM | POA: Diagnosis not present

## 2013-07-19 DIAGNOSIS — H35419 Lattice degeneration of retina, unspecified eye: Secondary | ICD-10-CM | POA: Diagnosis not present

## 2013-07-19 DIAGNOSIS — H4011X0 Primary open-angle glaucoma, stage unspecified: Secondary | ICD-10-CM | POA: Diagnosis not present

## 2013-08-08 DIAGNOSIS — B0052 Herpesviral keratitis: Secondary | ICD-10-CM | POA: Diagnosis not present

## 2013-08-16 DIAGNOSIS — B0052 Herpesviral keratitis: Secondary | ICD-10-CM | POA: Diagnosis not present

## 2013-10-18 ENCOUNTER — Ambulatory Visit (INDEPENDENT_AMBULATORY_CARE_PROVIDER_SITE_OTHER): Payer: Medicare Other | Admitting: Ophthalmology

## 2013-10-18 DIAGNOSIS — H33309 Unspecified retinal break, unspecified eye: Secondary | ICD-10-CM

## 2013-10-18 DIAGNOSIS — H35039 Hypertensive retinopathy, unspecified eye: Secondary | ICD-10-CM

## 2013-10-18 DIAGNOSIS — H3530 Unspecified macular degeneration: Secondary | ICD-10-CM

## 2013-10-18 DIAGNOSIS — I10 Essential (primary) hypertension: Secondary | ICD-10-CM

## 2013-10-18 DIAGNOSIS — H43819 Vitreous degeneration, unspecified eye: Secondary | ICD-10-CM

## 2013-10-26 DIAGNOSIS — H4011X0 Primary open-angle glaucoma, stage unspecified: Secondary | ICD-10-CM | POA: Diagnosis not present

## 2013-11-03 ENCOUNTER — Encounter: Payer: Medicare Other | Admitting: Internal Medicine

## 2013-11-09 ENCOUNTER — Encounter: Payer: Self-pay | Admitting: Internal Medicine

## 2013-11-09 ENCOUNTER — Ambulatory Visit (INDEPENDENT_AMBULATORY_CARE_PROVIDER_SITE_OTHER): Payer: Medicare Other | Admitting: Internal Medicine

## 2013-11-09 ENCOUNTER — Ambulatory Visit (INDEPENDENT_AMBULATORY_CARE_PROVIDER_SITE_OTHER): Payer: Medicare Other

## 2013-11-09 VITALS — BP 162/90 | HR 78 | Temp 98.4°F | Ht 72.0 in | Wt 193.2 lb

## 2013-11-09 DIAGNOSIS — N320 Bladder-neck obstruction: Secondary | ICD-10-CM

## 2013-11-09 DIAGNOSIS — I10 Essential (primary) hypertension: Secondary | ICD-10-CM | POA: Diagnosis not present

## 2013-11-09 DIAGNOSIS — E785 Hyperlipidemia, unspecified: Secondary | ICD-10-CM

## 2013-11-09 DIAGNOSIS — L02619 Cutaneous abscess of unspecified foot: Secondary | ICD-10-CM

## 2013-11-09 DIAGNOSIS — L03039 Cellulitis of unspecified toe: Secondary | ICD-10-CM | POA: Diagnosis not present

## 2013-11-09 DIAGNOSIS — M2040 Other hammer toe(s) (acquired), unspecified foot: Secondary | ICD-10-CM | POA: Diagnosis not present

## 2013-11-09 LAB — CBC WITH DIFFERENTIAL/PLATELET
BASOS PCT: 0.3 % (ref 0.0–3.0)
Basophils Absolute: 0 10*3/uL (ref 0.0–0.1)
Eosinophils Absolute: 0.2 10*3/uL (ref 0.0–0.7)
Eosinophils Relative: 3.6 % (ref 0.0–5.0)
HEMATOCRIT: 39.7 % (ref 39.0–52.0)
HEMOGLOBIN: 13.6 g/dL (ref 13.0–17.0)
LYMPHS ABS: 1.6 10*3/uL (ref 0.7–4.0)
Lymphocytes Relative: 33.7 % (ref 12.0–46.0)
MCHC: 34.4 g/dL (ref 30.0–36.0)
MCV: 85.6 fl (ref 78.0–100.0)
MONOS PCT: 13.9 % — AB (ref 3.0–12.0)
Monocytes Absolute: 0.6 10*3/uL (ref 0.1–1.0)
NEUTROS ABS: 2.2 10*3/uL (ref 1.4–7.7)
Neutrophils Relative %: 48.5 % (ref 43.0–77.0)
Platelets: 237 10*3/uL (ref 150.0–400.0)
RBC: 4.64 Mil/uL (ref 4.22–5.81)
RDW: 12.1 % (ref 11.5–15.5)
WBC: 4.6 10*3/uL (ref 4.0–10.5)

## 2013-11-09 LAB — HEPATIC FUNCTION PANEL
ALT: 18 U/L (ref 0–53)
AST: 20 U/L (ref 0–37)
Albumin: 4.2 g/dL (ref 3.5–5.2)
Alkaline Phosphatase: 66 U/L (ref 39–117)
BILIRUBIN DIRECT: 0.1 mg/dL (ref 0.0–0.3)
TOTAL PROTEIN: 6.9 g/dL (ref 6.0–8.3)
Total Bilirubin: 0.6 mg/dL (ref 0.2–1.2)

## 2013-11-09 LAB — BASIC METABOLIC PANEL
BUN: 17 mg/dL (ref 6–23)
CALCIUM: 9.4 mg/dL (ref 8.4–10.5)
CO2: 28 meq/L (ref 19–32)
CREATININE: 1.1 mg/dL (ref 0.4–1.5)
Chloride: 101 mEq/L (ref 96–112)
GFR: 86.96 mL/min (ref >60.00)
Glucose, Bld: 89 mg/dL (ref 70–99)
Potassium: 4.3 mEq/L (ref 3.5–5.1)
SODIUM: 139 meq/L (ref 135–145)

## 2013-11-09 LAB — URINALYSIS, ROUTINE W REFLEX MICROSCOPIC
BILIRUBIN URINE: NEGATIVE
Hgb urine dipstick: NEGATIVE
KETONES UR: NEGATIVE
LEUKOCYTES UA: NEGATIVE
Nitrite: NEGATIVE
RBC / HPF: NONE SEEN (ref 0–?)
Total Protein, Urine: NEGATIVE
URINE GLUCOSE: NEGATIVE
UROBILINOGEN UA: 0.2 (ref 0.0–1.0)
pH: 6 (ref 5.0–8.0)

## 2013-11-09 LAB — LIPID PANEL
Cholesterol: 215 mg/dL — ABNORMAL HIGH (ref 0–200)
HDL: 51.7 mg/dL (ref >39.00)
NonHDL: 163.3
TRIGLYCERIDES: 227 mg/dL — AB (ref 0.0–149.0)
Total CHOL/HDL Ratio: 4
VLDL: 45.4 mg/dL — AB (ref 0.0–40.0)

## 2013-11-09 LAB — TSH: TSH: 1.04 u[IU]/mL (ref 0.35–4.50)

## 2013-11-09 LAB — PSA: PSA: 4.15 ng/mL — ABNORMAL HIGH (ref 0.10–4.00)

## 2013-11-09 MED ORDER — AZITHROMYCIN 250 MG PO TABS: ORAL_TABLET | ORAL | Status: DC

## 2013-11-09 NOTE — Progress Notes (Signed)
Pre visit review using our clinic review tool, if applicable. No additional management support is needed unless otherwise documented below in the visit note. 

## 2013-11-09 NOTE — Progress Notes (Signed)
Subjective:    Patient ID: [name] Good, male    DOB: Jan 04, 1941, 73 y.o.   MRN: [id number]  HPI  Here to f/u; overall doing ok,  Pt denies chest pain, increased sob or doe, wheezing, orthopnea, PND, increased LE swelling, palpitations, dizziness or syncope.  Pt denies polydipsia, polyuria, or low sugar symptoms such as weakness or confusion improved with po intake.  Pt denies new neurological symptoms such as new headache, or facial or extremity weakness or numbness.   Pt states overall good compliance with meds, has been trying to follow lower cholesterol diet, with wt overall stable,  and tryinng to be active, Goes to [facility] 3-5 times per wk, with BP < 140/90.  Today incidentally with right second and third toes with red/swelling/tender without red streaks, drainage, fever, ulcers.  No hx of DM or PVD.  Also has localized swelling/tender to left medial volar wrist 1 cm area without redness,fluctuance.  Also with recent left knee pain, swelling once, then improved, but still some crackling Past Medical History  Diagnosis Date  . Hypertension   . Hyperlipidemia   . Balanitis     hx of  . Osteoarthritis     diffuse   Past Surgical History  Procedure Laterality Date  . Cataract extraction    . Tonsillectomy      reports that he quit smoking about 36 years ago. His smoking use included Cigarettes. He smoked 0.00 packs per day. He has never used smokeless tobacco. He reports that he does not drink alcohol or use illicit drugs. family history includes Cancer in his father; Diabetes in his brother; Other (age of onset: 81) in his mother. There is no history of Coronary artery disease. Allergies  Allergen Reactions  . Gemfibrozil     REACTION: unspecified  . Niacin     REACTION: unspecified  . Penicillins     REACTION: unspecified  . Valacyclovir Hcl     REACTION: unspecified   Current Outpatient Prescriptions on File Prior to Visit  Medication Sig Dispense Refill  . aspirin 81 MG  tablet Take 1 tablet (81 mg total) by mouth daily.  30 tablet    . Clobetasol Propionate (CLOBEX SPRAY) 0.05 % external spray Apply topically as needed.        Marland Kitchen lisinopril (PRINIVIL,ZESTRIL) 20 MG tablet take 1 tablet by mouth once daily  60 tablet  0  . lisinopril (PRINIVIL,ZESTRIL) 20 MG tablet take 1 tablet by mouth once daily  60 tablet  5  . multivitamin (THERAGRAN) tablet Take 1 tablet by mouth daily.        . brimonidine (ALPHAGAN P) 0.1 % SOLN as directed.        . clobetasol (TEMOVATE) 0.05 % ointment Apply topically as needed.         No current facility-administered medications on file prior to visit.    Review of Systems  Constitutional: Negative for unusual diaphoresis or other sweats  HENT: Negative for ringing in ear Eyes: Negative for double vision or worsening visual disturbance.  Respiratory: Negative for choking and stridor.   Gastrointestinal: Negative for vomiting or other signifcant bowel change Genitourinary: Negative for hematuria or decreased urine volume.  Musculoskeletal: Negative for other MSK pain or swelling Skin: Negative for color change and worsening wound.  Neurological: Negative for tremors and numbness other than noted  Psychiatric/Behavioral: Negative for decreased concentration or agitation other than above       Objective:   Physical Exam BP 162/90  Pulse 78  Temp(Src) 98.4 F (36.9 C) (Oral)  Ht 6' (1.829 m)  Wt 193 lb 4 oz (87.658 kg)  BMI 26.20 kg/m2  SpO2 97% VS noted,  Constitutional: Pt appears well-developed, well-nourished.  HENT: Head: NCAT.  Right Ear: External ear normal.  Left Ear: External ear normal.  Eyes: . Pupils are equal, round, and reactive to light. Conjunctivae and EOM are normal Neck: Normal range of motion. Neck supple.  Cardiovascular: Normal rate and regular rhythm.   Pulmonary/Chest: Effort normal and breath sounds normal.  Abd:  Soft, NT, ND, + BS Neurological: Pt is alert. Not confused , motor grossly  intact Skin: right 2nd and third toes with 1-2+ red/tender/swelling, also both hammertoes Psychiatric: Pt behavior is normal. No agitation.  Left knee with mild crepitus, no effusion, FROM Left medial wrist with localized area mild swelling/tender    Assessment & Plan:   BP Readings from Last 3 Encounters:  11/09/13 162/90  11/02/12 150/90  10/29/11 136/80

## 2013-11-09 NOTE — Patient Instructions (Signed)
Please take all new medication as prescribed  Please continue all other medications as before, and refills have been done if requested.  Please have the pharmacy call with any other refills you may need.  Please keep your appointments with your specialists as you may have planned  You will be contacted regarding the referral for: podiatry  Please go to the LAB in the Basement (turn left off the elevator) for the tests to be done today  You will be contacted by phone if any changes need to be made immediately.  Otherwise, you will receive a letter about your results with an explanation, but please check with MyChart first.  Please remember to sign up for MyChart if you have not done so, as this will be important to you in the future with finding out test results, communicating by private email, and scheduling acute appointments online when needed.

## 2013-11-10 LAB — LDL CHOLESTEROL, DIRECT: Direct LDL: 130.8 mg/dL

## 2013-11-12 DIAGNOSIS — N320 Bladder-neck obstruction: Secondary | ICD-10-CM | POA: Insufficient documentation

## 2013-11-12 NOTE — Assessment & Plan Note (Signed)
Also for psa as he is due 

## 2013-11-12 NOTE — Assessment & Plan Note (Addendum)
elev today likely reactive? , ow/ stable overall by history and exam, recent data reviewed with pt, and pt to continue medical treatment as before,  to f/u any worsening symptoms or concerns BP Readings from Last 3 Encounters:  11/09/13 162/90  11/02/12 150/90  10/29/11 136/80  for f/u bp at home and newt visit for yearly exam next month

## 2013-11-12 NOTE — Assessment & Plan Note (Signed)
For antibx,  to f/u any worsening symptoms or concerns

## 2013-11-12 NOTE — Assessment & Plan Note (Signed)
stable overall by history and exam, recent data reviewed with pt, and pt to continue medical treatment as before,  to f/u any worsening symptoms or concerns Lab Results  Component Value Date   LDLCALC 89 10/24/2010

## 2013-11-12 NOTE — Assessment & Plan Note (Signed)
Also for podiatry referral,  to f/u any worsening symptoms or concerns

## 2013-11-22 ENCOUNTER — Encounter: Payer: Self-pay | Admitting: Podiatry

## 2013-11-22 ENCOUNTER — Ambulatory Visit (INDEPENDENT_AMBULATORY_CARE_PROVIDER_SITE_OTHER): Payer: Medicare Other | Admitting: Podiatry

## 2013-11-22 ENCOUNTER — Ambulatory Visit (INDEPENDENT_AMBULATORY_CARE_PROVIDER_SITE_OTHER): Payer: Medicare Other

## 2013-11-22 VITALS — BP 126/78 | HR 84 | Resp 16 | Ht 72.0 in | Wt 186.0 lb

## 2013-11-22 DIAGNOSIS — M79674 Pain in right toe(s): Secondary | ICD-10-CM

## 2013-11-22 DIAGNOSIS — M79609 Pain in unspecified limb: Secondary | ICD-10-CM | POA: Diagnosis not present

## 2013-11-22 DIAGNOSIS — M779 Enthesopathy, unspecified: Secondary | ICD-10-CM | POA: Diagnosis not present

## 2013-11-22 MED ORDER — TRIAMCINOLONE ACETONIDE 10 MG/ML IJ SUSP
10.0000 mg | Freq: Once | INTRAMUSCULAR | Status: AC
Start: 2013-11-22 — End: 2013-11-22
  Administered 2013-11-22: 10 mg

## 2013-11-22 NOTE — Progress Notes (Signed)
   Subjective:    Patient ID: [name] Good, male    DOB: 09/05/1940, 73 y.o.   MRN: [id number]  HPI Comments: "I think I got two hammer toes"  Patient c/o aching 2nd and 3rd toes right for couple months. The toes are swollen and discolored. The skin is peeling around them. He has been cleaning with alcohol and asper cream. PCP Rx'd z-pack for precaution. No injury.     Review of Systems  Endocrine: Positive for polyuria.  Genitourinary: Positive for frequency.  Skin: Positive for rash.  All other systems reviewed and are negative.      Objective:   Physical Exam        Assessment & Plan:

## 2013-11-22 NOTE — Progress Notes (Signed)
Subjective:     Patient ID: [name] Good, male   DOB: 11/21/1940, 73 y.o.   MRN: [id number]  HPI patient presents with pain in the second and third toes the right foot and states that it's been present for a couple months and he also has discoloration in his toes in general. Also has trouble cutting his toenails do to inability to bend over   Review of Systems  All other systems reviewed and are negative.      Objective:   Physical Exam  Nursing note and vitals reviewed. Constitutional: He is oriented to person, place, and time.  Cardiovascular: Intact distal pulses.   Musculoskeletal: Normal range of motion.  Neurological: He is oriented to person, place, and time.  Skin: Skin is warm.   neurovascular status intact with muscle strength adequate and range of motion subtalar midtarsal joint within normal limits. Mild discoloration of the second and third toes right with third toe right exam having inflammation and pain at the distal interphalangeal joint upon palpation. 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 Debrided nailbeds 1-5 both feet and patient will be seen back as needed

## 2013-11-23 DIAGNOSIS — Z961 Presence of intraocular lens: Secondary | ICD-10-CM | POA: Diagnosis not present

## 2013-11-28 ENCOUNTER — Other Ambulatory Visit (INDEPENDENT_AMBULATORY_CARE_PROVIDER_SITE_OTHER): Payer: Medicare Other

## 2013-11-28 ENCOUNTER — Ambulatory Visit (INDEPENDENT_AMBULATORY_CARE_PROVIDER_SITE_OTHER): Payer: Medicare Other | Admitting: Family Medicine

## 2013-11-28 ENCOUNTER — Encounter: Payer: Self-pay | Admitting: Family Medicine

## 2013-11-28 ENCOUNTER — Ambulatory Visit (INDEPENDENT_AMBULATORY_CARE_PROVIDER_SITE_OTHER)
Admission: RE | Admit: 2013-11-28 | Discharge: 2013-11-28 | Disposition: A | Discharge status: Home / Self Care | Payer: Medicare Other | Source: Ambulatory Visit | Attending: Family Medicine | Admitting: Family Medicine

## 2013-11-28 VITALS — BP 148/84 | HR 85 | Ht 72.0 in | Wt 190.0 lb

## 2013-11-28 DIAGNOSIS — M25562 Pain in left knee: Secondary | ICD-10-CM

## 2013-11-28 DIAGNOSIS — M25569 Pain in unspecified knee: Secondary | ICD-10-CM

## 2013-11-28 DIAGNOSIS — M1710 Unilateral primary osteoarthritis, unspecified knee: Secondary | ICD-10-CM | POA: Insufficient documentation

## 2013-11-28 DIAGNOSIS — G5702 Lesion of sciatic nerve, left lower limb: Secondary | ICD-10-CM | POA: Insufficient documentation

## 2013-11-28 DIAGNOSIS — G5700 Lesion of sciatic nerve, unspecified lower limb: Secondary | ICD-10-CM | POA: Diagnosis not present

## 2013-11-28 DIAGNOSIS — M1712 Unilateral primary osteoarthritis, left knee: Secondary | ICD-10-CM

## 2013-11-28 NOTE — Assessment & Plan Note (Signed)
Patient home exercises Piriformis Syndrome  Using an anatomical model, reviewed with the patient the structures involved and how they related to diagnosis. The patient indicated understanding.   The patient was given a handout from Dr. Arne Cleveland book "The Sports Medicine Patient Advisor" describing the anatomy and rehabilitation of the following condition: Piriformis Syndrome  Also given a handout with more extensive Piriformis stretching, hip flexor and abductor strengthening, ham stretching  Rec deep massage, explained self-massage with ball

## 2013-11-28 NOTE — Assessment & Plan Note (Signed)
The patient is mild osteophytic changes on ultrasound today but we will get weightbearing films. We discussed icing protocol and patient was given a brace for a medial unloader brace was fitted by me today. We discussed topical anti-inflammatories and prescription was given. We warned the potential side effects. Patient does have a history of polymyalgia rheumatica that also could be contributing as well as for his psoriatic plaques. We'll keep this in the differential. Patient will try this for the next 4 weeks and come back for further evaluation including a home exercise program and icing. Patient is continuing to have pain may respond well to an intra-articular injection. We'll discuss a followup.
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
## 2013-11-28 NOTE — Patient Instructions (Signed)
Nice to meet you Xrays downstairs.  Ice 20 minutes 2 times daily. Usually after activity and before bed. Exercises 3 times a week. Alternate the knee and the hip When sitting long time you need a tennis ball in back left pocket.  Wear knee brace with a lot of activity.  Pennsaid 2 times daily. Only stop if it give rash or hurts your stomach.  Take tylenol 650 mg three times a day is the best evidence based medicine we have for arthritis.  Glucosamine sulfate 750mg  twice a day is a supplement that has been shown to help moderate to severe arthritis. Vitamin D 2000 IU daily Fish oil 2 grams daily.  Tumeric 500mg  twice daily.  Capsaicin topically up to four times a day may also help with pain. Cortisone injections are an option if these interventions do not seem to make a difference or need more relief.  If cortisone injections do not help, there are different types of shots that may help but they take longer to take effect.  We can discuss this at follow up.  It's important that you continue to stay active. Controlling your weight is important.  Cycling or elliptical or water aerobics.  Come back in 4 weeks.

## 2013-11-29 ENCOUNTER — Telehealth: Payer: Self-pay | Admitting: Internal Medicine

## 2013-11-29 ENCOUNTER — Ambulatory Visit (INDEPENDENT_AMBULATORY_CARE_PROVIDER_SITE_OTHER): Payer: Medicare Other | Admitting: Internal Medicine

## 2013-11-29 ENCOUNTER — Encounter: Payer: Self-pay | Admitting: Internal Medicine

## 2013-11-29 VITALS — BP 132/80 | HR 84 | Temp 98.3°F | Wt 191.5 lb

## 2013-11-29 DIAGNOSIS — E785 Hyperlipidemia, unspecified: Secondary | ICD-10-CM

## 2013-11-29 DIAGNOSIS — M1990 Unspecified osteoarthritis, unspecified site: Secondary | ICD-10-CM

## 2013-11-29 DIAGNOSIS — Z23 Encounter for immunization: Secondary | ICD-10-CM | POA: Diagnosis not present

## 2013-11-29 DIAGNOSIS — I10 Essential (primary) hypertension: Secondary | ICD-10-CM

## 2013-11-29 DIAGNOSIS — R972 Elevated prostate specific antigen [PSA]: Secondary | ICD-10-CM

## 2013-11-29 DIAGNOSIS — Z0000 Encounter for general adult medical examination without abnormal findings: Secondary | ICD-10-CM | POA: Diagnosis not present

## 2013-11-29 MED ORDER — LISINOPRIL 20 MG PO TABS: ORAL_TABLET | ORAL | Status: DC

## 2013-11-29 MED ORDER — LOVASTATIN 20 MG PO TABS: 20.0000 mg | ORAL_TABLET | Freq: Every day | ORAL | Status: DC

## 2013-11-29 NOTE — Assessment & Plan Note (Signed)
stable overall by history and exam, recent data reviewed with pt, and pt to continue medical treatment as before,  to f/u any worsening symptoms or concerns BP Readings from Last 3 Encounters:  11/29/13 132/80  11/28/13 148/84  11/22/13 126/78

## 2013-11-29 NOTE — Assessment & Plan Note (Signed)
stable overall by history and exam, and pt to continue medical treatment as before,  to f/u any worsening symptoms or concerns 

## 2013-11-29 NOTE — Progress Notes (Signed)
Pre visit review using our clinic review tool, if applicable. No additional management support is needed unless otherwise documented below in the visit note. 

## 2013-11-29 NOTE — Patient Instructions (Addendum)
You had the new Prevnar pneumonia shot today  Please take all new medication as prescribe - the low dose lovastatin 20 mg per day for cholesterol  Please continue all other medications as before, and refills have been done if requested.  Please have the pharmacy call with any other refills you may need.  Please continue your efforts at being more active, low cholesterol diet, and weight control.  You are otherwise up to date with prevention measures today.  Please keep your appointments with your specialists as you may have planned  You will be contacted regarding the referral for: colonoscopy, and urology  Please remember to sign up for MyChart if you have not done so, as this will be important to you in the future with finding out test results, communicating by private email, and scheduling acute appointments online when needed.  Please return in 1 year for your yearly visit, or sooner if needed

## 2013-11-29 NOTE — Progress Notes (Signed)
Subjective:    Patient ID: [name] Good, male    DOB: 1941-01-08, 73 y.o.   MRN: [id number]  HPI Here to f/u in transfer from Dr Linda Hedges, now retired; overall doing ok,  Pt denies chest pain, increased sob or doe, wheezing, orthopnea, PND, increased LE swelling, palpitations, dizziness or syncope.  Pt denies polydipsia, polyuria, or low sugar symptoms such as weakness or confusion improved with po intake.  Pt denies new neurological symptoms such as new headache, or facial or extremity weakness or numbness.   Pt states overall good compliance with meds, has been trying to follow lower cholesterol, diabetic diet, with wt overall stable,  but little exercise however. Denies urinary symptoms such as dysuria, frequency, urgency, flank pain, hematuria or n/v, fever, chills, except  Has nocturia 3xnight, with recent PSA slightly > 4. Has seen urology but years ago.  Past Medical History  Diagnosis Date  . Hypertension   . Hyperlipidemia   . Balanitis     hx of  . Osteoarthritis     diffuse   Past Surgical History  Procedure Laterality Date  . Cataract extraction    . Tonsillectomy      reports that he quit smoking about 36 years ago. His smoking use included Cigarettes. He smoked 0.00 packs per day. He has never used smokeless tobacco. He reports that he does not drink alcohol or use illicit drugs. family history includes Cancer in his father; Diabetes in his brother; Other (age of onset: 74) in his mother. There is no history of Coronary artery disease. Allergies  Allergen Reactions  . Gemfibrozil     REACTION: unspecified  . Lipitor [Atorvastatin]     Uneasy feeling  . Niacin     REACTION: unspecified  . Penicillins     REACTION: unspecified  . Valacyclovir Hcl     REACTION: unspecified   Current Outpatient Prescriptions on File Prior to Visit  Medication Sig Dispense Refill  . aspirin 81 MG tablet Take 1 tablet (81 mg total) by mouth daily.  30 tablet    . brimonidine  (ALPHAGAN P) 0.1 % SOLN as directed.        . clobetasol (TEMOVATE) 0.05 % ointment Apply topically as needed.        . Clobetasol Propionate (CLOBEX SPRAY) 0.05 % external spray Apply topically as needed.        . multivitamin (THERAGRAN) tablet Take 1 tablet by mouth daily.         No current facility-administered medications on file prior to visit.    Review of Systems  Constitutional: Negative for unusual diaphoresis or other sweats  HENT: Negative for ringing in ear Eyes: Negative for double vision or worsening visual disturbance.  Respiratory: Negative for choking and stridor.   Gastrointestinal: Negative for vomiting or other signifcant bowel change Genitourinary: Negative for hematuria or decreased urine volume.  Musculoskeletal: Negative for other MSK pain or swelling Skin: Negative for color change and worsening wound.  Neurological: Negative for tremors and numbness other than noted  Psychiatric/Behavioral: Negative for decreased concentration or agitation other than above       Objective:   Physical Exam BP 132/80  Pulse 84  Temp(Src) 98.3 F (36.8 C) (Oral)  Wt 191 lb 8 oz (86.864 kg)  SpO2 98% VS noted,  Constitutional: Pt appears well-developed, well-nourished.  HENT: Head: NCAT.  Right Ear: External ear normal.  Left Ear: External ear normal.  Eyes: . Pupils are equal, round, and reactive  to light. Conjunctivae and EOM are normal Neck: Normal range of motion. Neck supple.  Cardiovascular: Normal rate and regular rhythm.   Pulmonary/Chest: Effort normal and breath sounds normal.  Abd:  Soft, NT, ND, + BS Neurological: Pt is alert. Not confused , motor grossly intact Skin: Skin is warm. No rash Psychiatric: Pt behavior is normal. No agitation.  No joint effusions or tender    Assessment & Plan:

## 2013-11-29 NOTE — Assessment & Plan Note (Signed)
Lab Results  Component Value Date   PSA 4.15* 11/09/2013   PSA 1.77 09/02/2009   PSA 1.52 07/12/2008  PSA after long interval now increased - for urology eval, r/o malignancy

## 2013-11-29 NOTE — Assessment & Plan Note (Signed)
stable overall by history and exam, recent data reviewed with pt, and pt to continue medical treatment as before,  to f/u any worsening symptoms or concerns Lab Results  Component Value Date   LDLCALC 89 10/24/2010

## 2013-11-29 NOTE — Telephone Encounter (Signed)
Relevant patient education assigned to patient using Emmi. ° °

## 2013-12-20 ENCOUNTER — Encounter: Payer: Self-pay | Admitting: Internal Medicine

## 2013-12-26 ENCOUNTER — Ambulatory Visit (INDEPENDENT_AMBULATORY_CARE_PROVIDER_SITE_OTHER): Payer: Medicare Other | Admitting: Family Medicine

## 2013-12-26 ENCOUNTER — Encounter: Payer: Self-pay | Admitting: Family Medicine

## 2013-12-26 ENCOUNTER — Telehealth: Payer: Self-pay | Admitting: *Deleted

## 2013-12-26 VITALS — BP 138/72 | HR 77 | Ht 72.0 in | Wt 189.0 lb

## 2013-12-26 DIAGNOSIS — G5700 Lesion of sciatic nerve, unspecified lower limb: Secondary | ICD-10-CM

## 2013-12-26 DIAGNOSIS — G5702 Lesion of sciatic nerve, left lower limb: Secondary | ICD-10-CM

## 2013-12-26 DIAGNOSIS — M1712 Unilateral primary osteoarthritis, left knee: Secondary | ICD-10-CM

## 2013-12-26 DIAGNOSIS — M1710 Unilateral primary osteoarthritis, unspecified knee: Secondary | ICD-10-CM

## 2013-12-26 MED ORDER — DICLOFENAC SODIUM 2 % TD SOLN: TRANSDERMAL | Status: DC

## 2013-12-26 MED ORDER — DICLOFENAC SODIUM 1 % TD GEL: 2.0000 g | Freq: Two times a day (BID) | TRANSDERMAL | Status: DC

## 2013-12-26 NOTE — Assessment & Plan Note (Signed)
Patient is doing remarkably well. Patient encouraged to continue the exercises on a regular basis. Patient does have a past medical history significant for polymyalgia rheumatica that could cause some potential flares. We're going to continue to monitor. Patient will continue the bracing as needed. Patient was given a prescription for topical anti-inflammatory that ankle be helpful as well. Warned of potential side effects. Patient will follow up again in 4 weeks for further evaluation. If any worsening pain I would consider an injection.  Spent greater than 25 minutes with patient face-to-face and had greater than 50% of counseling including as described above in assessment and plan.

## 2013-12-26 NOTE — Patient Instructions (Signed)
Good to see you Ice is always your friend Continue the exercises 3 TIMES a WEEK.  Pennsaid up to 2 times daily as needed Continue the brace as needed Make an appointment in 4 weeks.

## 2013-12-26 NOTE — Telephone Encounter (Signed)
Notified pt with md response.../lmb 

## 2013-12-26 NOTE — Progress Notes (Signed)
  [facility] [place], [place] [address] Phone: [phone] Subjective:     CC: Left knee pain followup  WHQ:[id number] [name] Good is a 73 y.o. male coming in with complaint of left knee pain.   The patient states that his knee pain is significantly better. Patient states that he is approximately 85% better. Patient has been wearing the brace with activity which is helpful. Patient has been doing over-the-counter medicines doing well. Patient feels that the strength is improving as well.  Patient was also found to have piriformis syndrome previously. Patient states that the tennis ball has been helpful. Doing the exercises occasionally but not frequently enough. Patient states though that it is no longer stopping him from any activity and is not waking him up at night. Overall patient is very happy with the results.     Past medical history, social, surgical and family history all reviewed in electronic medical record.   Review of Systems: No headache, visual changes, nausea, vomiting, diarrhea, constipation, dizziness, abdominal pain, skin rash, fevers, chills, night sweats, weight loss, swollen lymph nodes, body aches, joint swelling, muscle aches, chest pain, shortness of breath, mood changes.   Objective Blood pressure 138/72, pulse 77, height 6' (1.829 m), weight 189 lb (85.73 kg), SpO2 97.00%.  General: No apparent distress alert and oriented x3 mood and affect normal, dressed appropriately.  HEENT: Pupils equal, extraocular movements intact  Respiratory: Patient's speak in full sentences and does not appear short of breath  Cardiovascular: No lower extremity edema, non tender, no erythema  Skin: Warm dry intact with no signs of infection or rash on extremities or on axial skeleton.  Abdomen: Soft nontender  Neuro: Cranial nerves II through XII are intact, neurovascularly intact in all extremities with 2+ DTRs and 2+ pulses.    Lymph: No lymphadenopathy of posterior or anterior cervical chain or axillae bilaterally.  Gait normal with good balance and coordination.  MSK:  Non tender with full range of motion and good stability and symmetric strength and tone of shoulders, elbows, wrist,   and ankles bilaterally.  Hip: Left ROM IR: 25 Deg, ER: 45 Deg, Flexion: 100 Deg, Extension: 90 Deg, Abduction: 45 Deg, Adduction: 45 Deg Strength IR: 5/5, ER: 5/5, Flexion: 5/5, Extension: 5/5, Abduction: 4/5, Adduction: 5/5 Pelvic alignment unremarkable to inspection and palpation. Standing hip rotation and gait without trendelenburg sign / unsteadiness. Greater trochanter nontender today Significant decrease in tenderness over the piriformis muscle. Positive Faber No SI joint tenderness and normal minimal SI movement. Contralateral hip unremarkable  Knee: Left On inspection patient does have some psoriatic plaques Minimally tender over the medial joint line ROM full in flexion and extension and lower leg rotation. Ligaments with solid consistent endpoints including ACL, PCL, LCL, MCL. Negative Mcmurray's, Apley's, and Thessalonian tests. Non painful patellar compression. Patellar glide with moderate crepitus. Patellar and quadriceps tendons unremarkable. Hamstring and quadriceps strength is normal.  Contralateral knee has some mild crepitus as well. No significant change from previous exam   Impression and Recommendations:     This case required medical decision making of moderate complexity.

## 2013-12-26 NOTE — Telephone Encounter (Signed)
Tell him trying voltaren should be at regular pharmacy.

## 2013-12-26 NOTE — Telephone Encounter (Signed)
Pt states the med md rx diclofenac is too expensive will cost over $700. Pt is requesting another alternative...[name] Good

## 2013-12-26 NOTE — Assessment & Plan Note (Signed)
Patient is doing remarkable better. Patient encouraged to continue the exercises on a regular basis 3-4 times a week. We're going to do this for a total of another 6 weeks. We discussed strengthening of the gluteal muscles. Patient overall is doing well. Patient will come back again in 4 weeks for further evaluation.

## 2013-12-27 DIAGNOSIS — N529 Male erectile dysfunction, unspecified: Secondary | ICD-10-CM | POA: Diagnosis not present

## 2013-12-27 DIAGNOSIS — N401 Enlarged prostate with lower urinary tract symptoms: Secondary | ICD-10-CM | POA: Diagnosis not present

## 2013-12-27 DIAGNOSIS — R972 Elevated prostate specific antigen [PSA]: Secondary | ICD-10-CM | POA: Diagnosis not present

## 2014-01-04 ENCOUNTER — Encounter: Payer: Self-pay | Admitting: Podiatry

## 2014-01-04 ENCOUNTER — Ambulatory Visit (INDEPENDENT_AMBULATORY_CARE_PROVIDER_SITE_OTHER): Payer: Medicare Other | Admitting: Podiatry

## 2014-01-04 VITALS — BP 145/79 | HR 79 | Resp 16

## 2014-01-04 DIAGNOSIS — M779 Enthesopathy, unspecified: Secondary | ICD-10-CM

## 2014-01-04 MED ORDER — TRIAMCINOLONE ACETONIDE 10 MG/ML IJ SUSP
10.0000 mg | Freq: Once | INTRAMUSCULAR | Status: AC
  Administered 2014-01-04: 10 mg

## 2014-01-08 NOTE — Progress Notes (Signed)
Subjective:     Patient ID: [name] Good, male   DOB: 1941-01-30, 73 y.o.   MRN: [id number]  HPI patient presents stating I'm still having pain in my foot   Review of Systems     Objective:   Physical Exam Neurovascular status intact with pain still noted first metatarsal right foot    Assessment:     Inflammatory condition with tendinitis still noted    Plan:     Injected the inflamed area 3 mg dexamethasone Kenalog 5 mg Xylocaine and advised on wider shoes

## 2014-01-09 DIAGNOSIS — Z23 Encounter for immunization: Secondary | ICD-10-CM | POA: Diagnosis not present

## 2014-01-22 ENCOUNTER — Telehealth: Payer: Self-pay

## 2014-01-22 NOTE — Telephone Encounter (Signed)
Flu documentation

## 2014-01-23 ENCOUNTER — Ambulatory Visit: Payer: Medicare Other | Admitting: Family Medicine

## 2014-02-19 ENCOUNTER — Ambulatory Visit (AMBULATORY_SURGERY_CENTER): Payer: Self-pay | Admitting: *Deleted

## 2014-02-19 VITALS — Ht 72.0 in | Wt 186.6 lb

## 2014-02-19 DIAGNOSIS — Z1211 Encounter for screening for malignant neoplasm of colon: Secondary | ICD-10-CM

## 2014-02-19 NOTE — Progress Notes (Signed)
No egg or soy allergy. ewm No home 02 use. ewm No diet pills, no blood thinners. ewm No problems with past sedation. ewm

## 2014-03-05 ENCOUNTER — Encounter: Payer: Self-pay | Admitting: Internal Medicine

## 2014-03-05 ENCOUNTER — Ambulatory Visit (AMBULATORY_SURGERY_CENTER): Payer: Medicare Other | Admitting: Internal Medicine

## 2014-03-05 VITALS — BP 120/71 | HR 50 | Temp 96.8°F | Resp 18 | Ht 72.0 in | Wt 186.0 lb

## 2014-03-05 DIAGNOSIS — I10 Essential (primary) hypertension: Secondary | ICD-10-CM | POA: Diagnosis not present

## 2014-03-05 DIAGNOSIS — D122 Benign neoplasm of ascending colon: Secondary | ICD-10-CM

## 2014-03-05 DIAGNOSIS — Z1211 Encounter for screening for malignant neoplasm of colon: Secondary | ICD-10-CM

## 2014-03-05 MED ORDER — SODIUM CHLORIDE 0.9 % IV SOLN: 500.0000 mL | INTRAVENOUS | Status: DC

## 2014-03-05 NOTE — Progress Notes (Signed)
Called to room to assist during endoscopic procedure.  Patient ID and intended procedure confirmed with present staff. Received instructions for my participation in the procedure from the performing physician.  

## 2014-03-05 NOTE — Op Note (Signed)
[place] [place]  [address] [place], [address]   COLONOSCOPY PROCEDURE REPORT  PATIENT: [name] Good, [name] Good  MR#: [id number] BIRTHDATE: 1940/08/24 , 13  yrs. old GENDER: male ENDOSCOPIST: Gatha Mayer, MD, [facility] PROCEDURE DATE:  03/05/2014 PROCEDURE:   Colonoscopy with snare polypectomy First Screening Colonoscopy - Avg.  risk and is 50 yrs.  old or older - No.  Prior Negative Screening - Now for repeat screening. 10 or more years since last screening  History of Adenoma - Now for follow-up colonoscopy & has been > or = to 3 yrs.  N/A  Polyps Removed Today? Yes. ASA CLASS:   Class II INDICATIONS:average risk for colorectal cancer. MEDICATIONS: Propofol 240 mg IV and Monitored anesthesia care  DESCRIPTION OF PROCEDURE:   After the risks benefits and alternatives of the procedure were thoroughly explained, informed consent was obtained.  The digital rectal exam revealed no abnormalities of the rectum, revealed no prostatic nodules, and revealed the prostate was not enlarged.   The LB [id number] endoscope was introduced through the anus and advanced to the cecum, which was identified by both the appendix and ileocecal valve. No adverse events experienced.   The quality of the prep was good, using MiraLax  The instrument was then slowly withdrawn as the colon was fully examined.      COLON FINDINGS: A sessile polyp measuring 5 mm in size was found.  A polypectomy was performed with a cold snare.  The resection was complete, the polyp tissue was completely retrieved and sent to histology.   The examination was otherwise normal.  Retroflexed views revealed no abnormalities. The time to cecum=3 minutes 26 seconds.  Withdrawal time=10 minutes 13 seconds.  The scope was withdrawn and the procedure completed. COMPLICATIONS: There were no immediate complications.  ENDOSCOPIC IMPRESSION: 1.   Sessile polyp measuring 5 mm in size was found; polypectomy  was performed with a cold snare 2.   The examination was otherwise normal - good prep  RECOMMENDATIONS: Doubt he will need another routine colonoscopy given diminutive polyp removed at 64 - will notify  eSigned:  Gatha Mayer, MD, [facility] 03/05/2014 10:51 AM   cc: The Patient

## 2014-03-05 NOTE — Patient Instructions (Addendum)
I found and removed one tiny polyp that looks benign. I will notify you about the pathology findings and if you should have another routine colonoscopy. Doubtful you will.  I appreciate the opportunity to care for you. Gatha Mayer, MD, FACG  YOU HAD AN ENDOSCOPIC PROCEDURE TODAY AT [facility]: Refer to the procedure report that was given to you for any specific questions about what was found during the examination.  If the procedure report does not answer your questions, please call your gastroenterologist to clarify.  If you requested that your care partner not be given the details of your procedure findings, then the procedure report has been included in a sealed envelope for you to review at your convenience later.  YOU SHOULD EXPECT: Some feelings of bloating in the abdomen. Passage of more gas than usual.  Walking can help get rid of the air that was put into your GI tract during the procedure and reduce the bloating. If you had a lower endoscopy (such as a colonoscopy or flexible sigmoidoscopy) you may notice spotting of blood in your stool or on the toilet paper. If you underwent a bowel prep for your procedure, then you may not have a normal bowel movement for a few days.  DIET: Your first meal following the procedure should be a light meal and then it is ok to progress to your normal diet.  A half-sandwich or bowl of soup is an example of a good first meal.  Heavy or fried foods are harder to digest and may make you feel nauseous or bloated.  Likewise meals heavy in dairy and vegetables can cause extra gas to form and this can also increase the bloating.  Drink plenty of fluids but you should avoid alcoholic beverages for 24 hours.  ACTIVITY: Your care partner should take you home directly after the procedure.  You should plan to take it easy, moving slowly for the rest of the day.  You can resume normal activity the day after the procedure however you should NOT  DRIVE or use heavy machinery for 24 hours (because of the sedation medicines used during the test).    SYMPTOMS TO REPORT IMMEDIATELY: A gastroenterologist can be reached at any hour.  During normal business hours, 8:30 AM to 5:00 PM Monday through Friday, call [phone].  After hours and on weekends, please call the GI answering service at [phone] who will take a message and have the physician on call contact you.   Following lower endoscopy (colonoscopy or flexible sigmoidoscopy):  Excessive amounts of blood in the stool  Significant tenderness or worsening of abdominal pains  Swelling of the abdomen that is new, acute  Fever of 100F or higher  FOLLOW UP: If any biopsies were taken you will be contacted by phone or by letter within the next 1-3 weeks.  Call your gastroenterologist if you have not heard about the biopsies in 3 weeks.  Our staff will call the home number listed on your records the next business day following your procedure to check on you and address any questions or concerns that you may have at that time regarding the information given to you following your procedure. This is a courtesy call and so if there is no answer at the home number and we have not heard from you through the emergency physician on call, we will assume that you have returned to your regular daily activities without incident.  SIGNATURES/CONFIDENTIALITY: You and/or your  care partner have signed paperwork which will be entered into your electronic medical record.  These signatures attest to the fact that that the information above on your After Visit Summary has been reviewed and is understood.  Full responsibility of the confidentiality of this discharge information lies with you and/or your care-partner.  Polyp information given.

## 2014-03-05 NOTE — Progress Notes (Signed)
Procedure ends, to recovery, report given and VSS. 

## 2014-03-06 ENCOUNTER — Telehealth: Payer: Self-pay | Admitting: *Deleted

## 2014-03-06 NOTE — Telephone Encounter (Signed)
No answer, left message to call office if questions or concerns. 

## 2014-03-09 ENCOUNTER — Encounter: Payer: Self-pay | Admitting: Internal Medicine

## 2014-03-09 DIAGNOSIS — Z860101 Personal history of adenomatous and serrated colon polyps: Secondary | ICD-10-CM

## 2014-03-09 DIAGNOSIS — Z86010 Personal history of colonic polyps: Secondary | ICD-10-CM

## 2014-03-09 HISTORY — DX: Personal history of adenomatous and serrated colon polyps: Z86.0101

## 2014-03-09 HISTORY — DX: Personal history of colonic polyps: Z86.010

## 2014-03-29 DIAGNOSIS — H47233 Glaucomatous optic atrophy, bilateral: Secondary | ICD-10-CM | POA: Diagnosis not present

## 2014-03-29 DIAGNOSIS — H3530 Unspecified macular degeneration: Secondary | ICD-10-CM | POA: Diagnosis not present

## 2014-06-20 DIAGNOSIS — R972 Elevated prostate specific antigen [PSA]: Secondary | ICD-10-CM | POA: Diagnosis not present

## 2014-06-27 DIAGNOSIS — N401 Enlarged prostate with lower urinary tract symptoms: Secondary | ICD-10-CM | POA: Diagnosis not present

## 2014-06-27 DIAGNOSIS — N5201 Erectile dysfunction due to arterial insufficiency: Secondary | ICD-10-CM | POA: Diagnosis not present

## 2014-06-27 DIAGNOSIS — N138 Other obstructive and reflux uropathy: Secondary | ICD-10-CM | POA: Diagnosis not present

## 2014-06-27 DIAGNOSIS — R972 Elevated prostate specific antigen [PSA]: Secondary | ICD-10-CM | POA: Diagnosis not present

## 2014-07-31 DIAGNOSIS — H4011X3 Primary open-angle glaucoma, severe stage: Secondary | ICD-10-CM | POA: Diagnosis not present

## 2014-10-24 ENCOUNTER — Ambulatory Visit (INDEPENDENT_AMBULATORY_CARE_PROVIDER_SITE_OTHER): Payer: Medicare Other | Admitting: Ophthalmology

## 2014-10-24 DIAGNOSIS — H43813 Vitreous degeneration, bilateral: Secondary | ICD-10-CM | POA: Diagnosis not present

## 2014-10-24 DIAGNOSIS — H3531 Nonexudative age-related macular degeneration: Secondary | ICD-10-CM

## 2014-10-24 DIAGNOSIS — I10 Essential (primary) hypertension: Secondary | ICD-10-CM

## 2014-10-24 DIAGNOSIS — H35033 Hypertensive retinopathy, bilateral: Secondary | ICD-10-CM | POA: Diagnosis not present

## 2014-10-24 DIAGNOSIS — H33303 Unspecified retinal break, bilateral: Secondary | ICD-10-CM

## 2014-11-09 DIAGNOSIS — H4011X3 Primary open-angle glaucoma, severe stage: Secondary | ICD-10-CM | POA: Diagnosis not present

## 2014-11-09 DIAGNOSIS — H3530 Unspecified macular degeneration: Secondary | ICD-10-CM | POA: Diagnosis not present

## 2014-11-28 ENCOUNTER — Telehealth: Payer: Self-pay

## 2014-11-28 ENCOUNTER — Ambulatory Visit (INDEPENDENT_AMBULATORY_CARE_PROVIDER_SITE_OTHER): Payer: Medicare Other

## 2014-11-28 VITALS — BP 116/70 | Ht 72.0 in | Wt 191.8 lb

## 2014-11-28 DIAGNOSIS — Z0000 Encounter for general adult medical examination without abnormal findings: Secondary | ICD-10-CM | POA: Diagnosis not present

## 2014-11-28 NOTE — Progress Notes (Addendum)
Subjective:   [name] Good is a 74 y.o. male who presents for an Initial Medicare Annual Wellness Visit.  Review of Systems  HRA assessment completed during visit;  Patient is here for Annual Wellness Assessment The Patient was informed that this wellness visit is to identify risk and educate on how to reduce risk for increase disease through lifestyle changes.  The patient verbalized understanding that any voiced medical issues still need to be directed to their physician.    Personalized education given regarding risk on the problem list that are currently being medically managed;  Lifestyle changes reviewed high BP BP normal; Lisinopril was increased but he was not taking his BP at home when [name] Good increased it.  Guestions if he needs 20mg  but agrees that it is working and he is not having any side effects; fatigue or dry mouth or other se at this time  BMI 25.9  Lipids; trig 227; HLD 51; States he had big meal prior to lab draw as his trig were high; but weight is normal and eats a very clean diet;  Turmeric for OA of knee  Works hard to stay healthy; Diet: super good; Breakfast; lunch supper Eggs and oatmeal; toast; apple; Lunch; sardines are good  Supper: eats a little piece of meat and 3 vegetables;  Snacks; Granola bar Exercise: goes to the [employer] 3 to 4 times a week; is there a couple of hours 30 min bicycle; 10 rep rowboat and abds;  Che[place] [employer];   Lives in single family one level home Discussed long term plan to stay in home or other; to save for remodeling that may be needed or to plan on moving. Falls; none Hx of depression Sleeping at hs; sleeps well/ 7 to 8 hours of sleep  Did discuss memory issues during AD8 eval; Stated that wife may be having some memory issues. Encouraged him to write down concerns; share with doctor;  Resources discussed if needed;   Screenings overdue:  Ophthalmology exam; eyes; + for glaucoma; takes 2 eye drops;  [name] Good;  Eyes checked 3 to 4 times a year Macular degeneration in process of being dx; apt scheduled for 8/23 / has cut back on driving; Patient had to look very closely at print during this assessment; so deteriorating vision is a concern., Immunizations up to date Tetanus/ due 2020  Shingles educated to check with insurer; took at [facility] and will fup for confirmation Colonoscopy; does not need any more per GI  EKG 10/2010 Hearing: 2000hz  in both ears Dental: regularly  Current Care Team reviewed and updated [name] Good [name] Good   Cardiac Risk Factors include: advanced age (>59men, >67 women);hypertension    Objective:    Today's Vitals   11/28/14 1003  BP: 116/70  Height: 6' (1.829 m)  Weight: 191 lb 12 oz (86.977 kg)    Current Medications (verified) Outpatient Encounter Prescriptions as of 11/28/2014  Medication Sig  . brimonidine (ALPHAGAN P) 0.1 % SOLN as directed.    . Clobetasol Propionate (CLOBEX SPRAY) 0.05 % external spray Apply topically as needed.    Marland Kitchen lisinopril (PRINIVIL,ZESTRIL) 20 MG tablet take 1 tablet by mouth once daily  . multivitamin (THERAGRAN) tablet Take 1 tablet by mouth daily.    Marland Kitchen aspirin 81 MG tablet Take 1 tablet (81 mg total) by mouth daily. (Patient not taking: Reported on 11/28/2014)  . FLUZONE HIGH-DOSE 0.5 ML SUSY  No facility-administered encounter medications on file as of 11/28/2014.    Allergies (verified) Gemfibrozil; Lipitor; Niacin; Penicillins; and Valacyclovir hcl   History: Past Medical History  Diagnosis Date  . Hypertension   . Hyperlipidemia   . Balanitis     hx of  . Osteoarthritis     diffuse  . Cataract   . Glaucoma   . Stroke     pt states ? when had  . Hx of adenomatous polyp of colon 03/09/2014   Past Surgical History  Procedure Laterality Date  . Cataract extraction    . Tonsillectomy    . Colonoscopy  06-05-2003    hems only   Family History  Problem  Relation Age of Onset  . Other Mother 70    CVA  . Cancer Father     lung  . Diabetes Brother   . Coronary artery disease Neg Hx   . Colon cancer Neg Hx    Social History   Occupational History  . PASTOR    Social History Main Topics  . Smoking status: Former Smoker -- 1.00 packs/day for 20 years    Types: Cigarettes    Quit date: 04/20/1977  . Smokeless tobacco: Never Used  . Alcohol Use: No  . Drug Use: No  . Sexual Activity:    Partners: Female   Tobacco Counseling Counseling given: Not Answered smoked 20 years; stopped > 15 years ago  Activities of Daily Living In your present state of health, do you have any difficulty performing the following activities: 11/28/2014  Hearing? N  Vision? Y  Difficulty concentrating or making decisions? N  Walking or climbing stairs? N  Dressing or bathing? N  Doing errands, shopping? N  Preparing Food and eating ? N  Using the Toilet? N  In the past six months, have you accidently leaked urine? N  Do you have problems with loss of bowel control? N  Managing your Medications? N  Managing your Finances? N  Housekeeping or managing your Housekeeping? N    Immunizations and Health Maintenance Immunization History  Administered Date(s) Administered  . Influenza Split 03/03/2011, 02/09/2012  . Influenza Whole 04/06/2007, 01/31/2008, 02/26/2009, 03/21/2010  . Influenza, High Dose Seasonal PF 02/14/2013, 01/09/2014  . Pneumococcal Conjugate-13 11/29/2013  . Pneumococcal Polysaccharide-23 09/02/2009, 03/03/2011  . Td 12/19/1997, 02/26/2009   Health Maintenance Due  Topic Date Due  . ZOSTAVAX  02/28/2000  . INFLUENZA VACCINE  11/19/2014    Patient Care Team: [name] Borg, MD as PCP - General (Internal Medicine) [name] Pearson, MD (Ophthalmology)  Indicate any recent Medical Services you may have received from other than [facility] providers in the past year (date may be approximate).    Assessment:   This is a routine wellness  examination for [name] Good.   Objective:   The goal of the wellness visit is to assist the patient how to close the gaps in care and create a preventative care plan for the patient.  Personalized Education was given regarding:   Pt determined a personalized goal; see patient goals;  Assessment included: Taking meds without issues; no barriers identified  Labs were and fup visit noted with MD if labs are due to be re-drawn.  Stress: Recommendations for managing stress if assessed as a factor;   No Risk for hepatitis or high risk social behavior identified via hepatitis screen/ deferred; no risk verbalized;   Educated on shingles and follow up with insurance company for co-pays or charges applied to Part D  benefit./ will check to confirm receipt at [facility] pharmacy Educated on Vaccines;  Safety issues reviewed;  Cognition assessed by AD8; Score 0 MMSE deferred as the patient stated they had no memory issues; No identified risk were noted; The patient was oriented x 3; appropriate in dress and manner and no objective failures at ADL's or IADL's.  Did educate on memory loss in general due to possible issue with spouse  Depression screen negative;   Functional status reviewed; no losses in function x 1 year  Bowel and bladder issues assessed  End of life planning was discussed; aging in home or other; plans to complete HCPOA were discussed if not completed Did take forms to review and given resource of [facility] for further information.   Hearing/Vision screen  Hearing Screening   125Hz  250Hz  500Hz  1000Hz  2000Hz  4000Hz  8000Hz   Right ear:     100    Left ear:     100     Will seek hearing screen at a later time; given education on hearing loss  Dietary issues and exercise activities discussed: Current Exercise Habits:: Structured exercise class, Type of exercise: strength training/weights;stretching, Frequency (Times/Week): 4, Intensity: Moderate  Goals    . exercise      Try to exercise x 2 per day; will assist with HDL (50; goal 60 )         Depression Screen PHQ 2/9 Scores 11/28/2014 11/29/2013  PHQ - 2 Score 0 0    Fall Risk Fall Risk  11/28/2014 11/29/2013  Falls in the past year? No No    Cognitive Function: MMSE - Mini Mental State Exam 11/28/2014  Not completed: Unable to complete    Screening Tests Health Maintenance  Topic Date Due  . ZOSTAVAX  02/28/2000  . INFLUENZA VACCINE  11/19/2014  . TETANUS/TDAP  02/27/2019  . COLONOSCOPY  03/05/2024  . PNA vac Low Risk Adult  Completed        Plan:   Being evaluated for macular degeneration;  Did have to hold paper close to eyes to see print, but could read. Is not driving this month until eyes eval completed 8/23;  Is concerned that wife may have memory issues;   States GI stated he did not need another colonoscopy.  [facility] for [facility]ingles vaccine;     During the course of the visit [name] Good was educated and counseled about the following appropriate screening and preventive services:   Vaccines to include Pneumoccal, Influenza, Hepatitis B, Td, Zostavax, HCV  Electrocardiogram/ 10/2010  Colorectal cancer screening/ completed  Cardiovascular disease screening/ will defer to doctor to recheck labs  Diabetes screening/   Glaucoma screening/ taking eye drops currently  Nutrition counseling/ eats healthy  Prostate cancer screening/ PSA 10/2013 was 4.15 / deferred to CPE 8/17  Patient Instructions (the written plan) were given to the patient.   RKYHC,WCBJS, RN   11/28/2014    Medical screening examination/treatment/procedure(s) were performed by non-physician practitioner and as supervising physician I was immediately available for consultation/collaboration. I agree with above. Cathlean Cower, MD

## 2014-11-28 NOTE — Patient Instructions (Addendum)
[name] Good , Thank you for taking time to come for your Medicare Wellness Visit. I appreciate your ongoing commitment to your health goals. Please review the following plan we discussed and let me know if I can assist you in the future.   Eye apt 8/23 to fup on macular degeneration/    These are the goals we discussed: Goals    . exercise     Try to exercise x 2 per day; will assist with HDL (50; goal 60 )          This is a list of the screening recommended for you and due dates:  Health Maintenance  Topic Date Due  . Shingles Vaccine  02/28/2000  . Flu Shot  11/19/2014  . Tetanus Vaccine  02/27/2019  . Colon Cancer Screening  03/05/2024  . Pneumonia vaccines  Completed      Glaucoma Glaucoma happens when the fluid pressure in the eyeball is too high. The pressure cannot stay high for too long, or the eyeball may become damaged. Signs of glaucoma include:  Having a hard time seeing in a dark room after being in a bright one.  Having trouble seeing things out to the sides of you.  Blurry sight.  Seeing bright white lights or colors in front of your eyes.  Headaches.  Feeling sick to your stomach (nauseous) or throwing up (vomiting).  Sudden vision loss. Glaucoma testing is an important part of taking care of your eyesight. HOME CARE  Always use your eyedrops or pills as told by your doctor. Do not run out.  Do not go away from home without your eyedrops or pills.  Keep your appointments.  Always tell a new doctor that you have glaucoma and how long you have had it. Tell the doctor about the eyedrops and pills you take.  Do not use eyedrops or pills that have not been prescribed by your doctor. GET HELP RIGHT AWAY IF:  You develop severe pain in the affected eye.  You develop vision problems.  You develop a bad headache in the area around the eye.  You feel sick to your stomach or throw up.  You start to have problems with the other eye. MAKE  SURE YOU:   Understand these instructions.  Will watch your condition.  Will get help right away if you are not doing well or get worse. Document Released: 01/14/2008 Document Revised: 06/29/2011 Document Reviewed: 01/14/2008 [facility] Patient Information 2015 [place], [place]. This information is not intended to replace advice given to you by your health care provider. Make sure you discuss any questions you have with your health care provider.  Health Maintenance A healthy lifestyle and preventative care can promote health and wellness.  Maintain regular health, dental, and eye exams.  Eat a healthy diet. Foods like vegetables, fruits, whole grains, low-fat dairy products, and lean protein foods contain the nutrients you need and are low in calories. Decrease your intake of foods high in solid fats, added sugars, and salt. Get information about a proper diet from your health care provider, if necessary.  Regular physical exercise is one of the most important things you can do for your health. Most adults should get at least 150 minutes of moderate-intensity exercise (any activity that increases your heart rate and causes you to sweat) each week. In addition, most adults need muscle-strengthening exercises on 2 or more days a week.   Maintain a healthy weight. The body mass index (BMI) is a screening  tool to identify possible weight problems. It provides an estimate of body fat based on height and weight. Your health care provider can find your BMI and can help you achieve or maintain a healthy weight. For males 20 years and older:  A BMI below 18.5 is considered underweight.  A BMI of 18.5 to 24.9 is normal.  A BMI of 25 to 29.9 is considered overweight.  A BMI of 30 and above is considered obese.  Maintain normal blood lipids and cholesterol by exercising and minimizing your intake of saturated fat. Eat a balanced diet with plenty of fruits and vegetables. Blood tests for lipids and  cholesterol should begin at age 55 and be repeated every 5 years. If your lipid or cholesterol levels are high, you are over age 73, or you are at high risk for heart disease, you may need your cholesterol levels checked more frequently.Ongoing high lipid and cholesterol levels should be treated with medicines if diet and exercise are not working.  If you smoke, find out from your health care provider how to quit. If you do not use tobacco, do not start.  Lung cancer screening is recommended for adults aged 5-80 years who are at high risk for developing lung cancer because of a history of smoking. A yearly low-dose CT scan of the lungs is recommended for people who have at least a 30-pack-year history of smoking and are current smokers or have quit within the past 15 years. A pack year of smoking is smoking an average of 1 pack of cigarettes a day for 1 year (for example, a 30-pack-year history of smoking could mean smoking 1 pack a day for 30 years or 2 packs a day for 15 years). Yearly screening should continue until the smoker has stopped smoking for at least 15 years. Yearly screening should be stopped for people who develop a health problem that would prevent them from having lung cancer treatment.  If you choose to drink alcohol, do not have more than 2 drinks per day. One drink is considered to be 12 oz (360 mL) of beer, 5 oz (150 mL) of wine, or 1.5 oz (45 mL) of liquor.  Avoid the use of street drugs. Do not share needles with anyone. Ask for help if you need support or instructions about stopping the use of drugs.  High blood pressure causes heart disease and increases the risk of stroke. Blood pressure should be checked at least every 1-2 years. Ongoing high blood pressure should be treated with medicines if weight loss and exercise are not effective.  If you are 40-56 years old, ask your health care provider if you should take aspirin to prevent heart disease.  Diabetes screening involves  taking a blood sample to check your fasting blood sugar level. This should be done once every 3 years after age 100 if you are at a normal weight and without risk factors for diabetes. Testing should be considered at a younger age or be carried out more frequently if you are overweight and have at least 1 risk factor for diabetes.  Colorectal cancer can be detected and often prevented. Most routine colorectal cancer screening begins at the age of 24 and continues through age 9. However, your health care provider may recommend screening at an earlier age if you have risk factors for colon cancer. On a yearly basis, your health care provider may provide home test kits to check for hidden blood in the stool. A small camera at  the end of a tube may be used to directly examine the colon (sigmoidoscopy or colonoscopy) to detect the earliest forms of colorectal cancer. Talk to your health care provider about this at age 48 when routine screening begins. A direct exam of the colon should be repeated every 5-10 years through age 47, unless early forms of precancerous polyps or small growths are found.  People who are at an increased risk for hepatitis B should be screened for this virus. You are considered at high risk for hepatitis B if:  You were born in a country where hepatitis B occurs often. Talk with your health care provider about which countries are considered high risk.  Your parents were born in a high-risk country and you have not received a shot to protect against hepatitis B (hepatitis B vaccine).  You have HIV or AIDS.  You use needles to inject street drugs.  You live with, or have sex with, someone who has hepatitis B.  You are a man who has sex with other men (MSM).  You get hemodialysis treatment.  You take certain medicines for conditions like cancer, organ transplantation, and autoimmune conditions.  Hepatitis C blood testing is recommended for all people born from [birth year] through 1966  and any individual with known risk factors for hepatitis C.  Healthy men should no longer receive prostate-specific antigen (PSA) blood tests as part of routine cancer screening. Talk to your health care provider about prostate cancer screening.  Testicular cancer screening is not recommended for adolescents or adult males who have no symptoms. Screening includes self-exam, a health care provider exam, and other screening tests. Consult with your health care provider about any symptoms you have or any concerns you have about testicular cancer.  Practice safe sex. Use condoms and avoid high-risk sexual practices to reduce the spread of sexually transmitted infections (STIs).  You should be screened for STIs, including gonorrhea and chlamydia if:  You are sexually active and are younger than 24 years.  You are older than 24 years, and your health care provider tells you that you are at risk for this type of infection.  Your sexual activity has changed since you were last screened, and you are at an increased risk for chlamydia or gonorrhea. Ask your health care provider if you are at risk.  If you are at risk of being infected with HIV, it is recommended that you take a prescription medicine daily to prevent HIV infection. This is called pre-exposure prophylaxis (PrEP). You are considered at risk if:  You are a man who has sex with other men (MSM).  You are a heterosexual man who is sexually active with multiple partners.  You take drugs by injection.  You are sexually active with a partner who has HIV.  Talk with your health care provider about whether you are at high risk of being infected with HIV. If you choose to begin PrEP, you should first be tested for HIV. You should then be tested every 3 months for as long as you are taking PrEP.  Use sunscreen. Apply sunscreen liberally and repeatedly throughout the day. You should seek shade when your shadow is shorter than you. Protect  yourself by wearing long sleeves, pants, a wide-brimmed hat, and sunglasses year round whenever you are outdoors.  Tell your health care provider of new moles or changes in moles, especially if there is a change in shape or color. Also, tell your health care provider if a mole is  larger than the size of a pencil eraser.  A one-time screening for abdominal aortic aneurysm (AAA) and surgical repair of large AAAs by ultrasound is recommended for men aged 11-75 years who are current or former smokers.  Stay current with your vaccines (immunizations). Document Released: 10/03/2007 Document Revised: 04/11/2013 Document Reviewed: 09/01/2010 [facility] Patient Information 2015 [place], [place]. This information is not intended to replace advice given to you by your health care provider. Make sure you discuss any questions you have with your health care provider.  Hearing Loss A hearing loss is sometimes called deafness. Hearing loss may be partial or total. CAUSES Hearing loss may be caused by:  Wax in the ear canal.  Infection of the ear canal.  Infection of the middle ear.  Trauma to the ear or surrounding area.  Fluid in the middle ear.  A hole in the eardrum (perforated eardrum).  Exposure to loud sounds or music.  Problems with the hearing nerve.  Certain medications. Hearing loss without wax, infection, or a history of injury may mean that the nerve is involved. Hearing loss with severe dizziness, nausea and vomiting or ringing in the ear may suggest a hearing nerve irritation or problems in the middle or inner ear. If hearing loss is untreated, there is a greater likelihood for residual or permanent hearing loss. DIAGNOSIS A hearing test (audiometry) assesses hearing loss. The audiometry test needs to be performed by a hearing specialist (audiologist). TREATMENT Treatment for recent onset of hearing loss may include:  Ear wax removal.  Medications that kill germs  (antibiotics).  Cortisone medications.  Prompt follow up with the appropriate specialist. Return of hearing depends on the cause of your hearing loss, so proper medical follow-up is important. Some hearing loss may not be reversible, and a caregiver should discuss care and treatment options with you. SEEK MEDICAL CARE IF:   You have a severe headache, dizziness, or changes in vision.  You have new or increased weakness.  You develop repeated vomiting or other serious medical problems.  You have a fever. Document Released: 04/06/2005 Document Revised: 06/29/2011 Document Reviewed: 08/01/2009 [facility] Patient Information 2015 [place], [place]. This information is not intended to replace advice given to you by your health care provider. Make sure you discuss any questions you have with your health care provider.

## 2014-11-28 NOTE — Telephone Encounter (Signed)
Call to [facility]; Spoke to pharmacy tech who stated shingles vaccine given on 11/2012 Will fax confirmation to Manuela Schwartz at [phone]

## 2014-12-05 ENCOUNTER — Ambulatory Visit (INDEPENDENT_AMBULATORY_CARE_PROVIDER_SITE_OTHER): Payer: Medicare Other | Admitting: Internal Medicine

## 2014-12-05 ENCOUNTER — Encounter: Payer: Self-pay | Admitting: Internal Medicine

## 2014-12-05 ENCOUNTER — Other Ambulatory Visit (INDEPENDENT_AMBULATORY_CARE_PROVIDER_SITE_OTHER): Payer: Medicare Other

## 2014-12-05 VITALS — BP 118/74 | HR 79 | Temp 97.7°F | Ht 72.0 in | Wt 191.0 lb

## 2014-12-05 DIAGNOSIS — I10 Essential (primary) hypertension: Secondary | ICD-10-CM

## 2014-12-05 DIAGNOSIS — R972 Elevated prostate specific antigen [PSA]: Secondary | ICD-10-CM

## 2014-12-05 DIAGNOSIS — E785 Hyperlipidemia, unspecified: Secondary | ICD-10-CM

## 2014-12-05 LAB — CBC WITH DIFFERENTIAL/PLATELET
BASOS PCT: 0.8 % (ref 0.0–3.0)
Basophils Absolute: 0 10*3/uL (ref 0.0–0.1)
EOS PCT: 4.2 % (ref 0.0–5.0)
Eosinophils Absolute: 0.2 10*3/uL (ref 0.0–0.7)
HEMATOCRIT: 41 % (ref 39.0–52.0)
HEMOGLOBIN: 13.7 g/dL (ref 13.0–17.0)
LYMPHS PCT: 32.7 % (ref 12.0–46.0)
Lymphs Abs: 1.3 10*3/uL (ref 0.7–4.0)
MCHC: 33.4 g/dL (ref 30.0–36.0)
MCV: 86.2 fl (ref 78.0–100.0)
MONO ABS: 0.5 10*3/uL (ref 0.1–1.0)
MONOS PCT: 12 % (ref 3.0–12.0)
Neutro Abs: 2 10*3/uL (ref 1.4–7.7)
Neutrophils Relative %: 50.3 % (ref 43.0–77.0)
Platelets: 224 10*3/uL (ref 150.0–400.0)
RBC: 4.75 Mil/uL (ref 4.22–5.81)
RDW: 12.2 % (ref 11.5–15.5)
WBC: 4 10*3/uL (ref 4.0–10.5)

## 2014-12-05 LAB — HEPATIC FUNCTION PANEL
ALK PHOS: 64 U/L (ref 39–117)
ALT: 15 U/L (ref 0–53)
AST: 17 U/L (ref 0–37)
Albumin: 4.4 g/dL (ref 3.5–5.2)
BILIRUBIN DIRECT: 0.1 mg/dL (ref 0.0–0.3)
BILIRUBIN TOTAL: 0.5 mg/dL (ref 0.2–1.2)
Total Protein: 6.9 g/dL (ref 6.0–8.3)

## 2014-12-05 LAB — PSA: PSA: 3.8 ng/mL (ref 0.10–4.00)

## 2014-12-05 LAB — LIPID PANEL
Cholesterol: 226 mg/dL — ABNORMAL HIGH (ref 0–200)
HDL: 49.7 mg/dL (ref >39.00)
LDL CALC: 148 mg/dL — AB (ref 0–99)
NONHDL: 175.82
Total CHOL/HDL Ratio: 5
Triglycerides: 139 mg/dL (ref 0.0–149.0)
VLDL: 27.8 mg/dL (ref 0.0–40.0)

## 2014-12-05 LAB — BASIC METABOLIC PANEL
BUN: 14 mg/dL (ref 6–23)
CALCIUM: 9.5 mg/dL (ref 8.4–10.5)
CO2: 30 mEq/L (ref 19–32)
Chloride: 102 mEq/L (ref 96–112)
Creatinine, Ser: 1.07 mg/dL (ref 0.40–1.50)
GFR: 86.7 mL/min (ref >60.00)
Glucose, Bld: 110 mg/dL — ABNORMAL HIGH (ref 70–99)
POTASSIUM: 4.9 meq/L (ref 3.5–5.1)
SODIUM: 139 meq/L (ref 135–145)

## 2014-12-05 LAB — URINALYSIS, ROUTINE W REFLEX MICROSCOPIC
BILIRUBIN URINE: NEGATIVE
HGB URINE DIPSTICK: NEGATIVE
Ketones, ur: NEGATIVE
NITRITE: NEGATIVE
RBC / HPF: NONE SEEN (ref 0–?)
Specific Gravity, Urine: <=1.005 — AB (ref 1.000–1.030)
TOTAL PROTEIN, URINE-UPE24: NEGATIVE
Urine Glucose: NEGATIVE
Urobilinogen, UA: 0.2 (ref 0.0–1.0)
pH: 6.5 (ref 5.0–8.0)

## 2014-12-05 LAB — TSH: TSH: 0.91 u[IU]/mL (ref 0.35–4.50)

## 2014-12-05 NOTE — Patient Instructions (Signed)

## 2014-12-05 NOTE — Assessment & Plan Note (Signed)
stable overall by history and exam, recent data reviewed with pt, and pt to continue medical treatment as before,  to f/u any worsening symptoms or concerns Lab Results  Component Value Date   LDLCALC 89 10/24/2010

## 2014-12-05 NOTE — Progress Notes (Signed)
Pre visit review using our clinic review tool, if applicable. No additional management support is needed unless otherwise documented below in the visit note. 

## 2014-12-05 NOTE — Assessment & Plan Note (Signed)
stable overall by history and exam, recent data reviewed with pt, and pt to continue medical treatment as before,  to f/u any worsening symptoms or concerns BP Readings from Last 3 Encounters:  12/05/14 118/74  11/28/14 116/70  03/05/14 120/71

## 2014-12-05 NOTE — Assessment & Plan Note (Signed)
Asympt, no biopsy yet, for psa and f/u urology as planned

## 2014-12-05 NOTE — Progress Notes (Signed)
Subjective:    Patient ID: [name] Good, male    DOB: 01-Sep-1940, 74 y.o.   MRN: [id number]  HPI  Here for yearly f/u;  Overall doing ok;  Pt denies Chest pain, worsening SOB, DOE, wheezing, orthopnea, PND, worsening LE edema, palpitations, dizziness or syncope.  Pt denies neurological change such as new headache, facial or extremity weakness.  Pt denies polydipsia, polyuria, or low sugar symptoms. Pt states overall good compliance with treatment and medications, good tolerability, and has been trying to follow appropriate diet.  Pt denies worsening depressive symptoms, suicidal ideation or panic. No fever, night sweats, wt loss, loss of appetite, or other constitutional symptoms.  Pt states good ability with ADL's, has low fall risk, home safety reviewed and adequate, no other significant changes in hearing or vision, and only occasionally active with exercise.  Sees urology in f/u next month Past Medical History  Diagnosis Date  . Hypertension   . Hyperlipidemia   . Balanitis     hx of  . Osteoarthritis     diffuse  . Cataract   . Glaucoma   . Stroke     pt states ? when had  . Hx of adenomatous polyp of colon 03/09/2014   Past Surgical History  Procedure Laterality Date  . Cataract extraction    . Tonsillectomy    . Colonoscopy  06-05-2003    hems only    reports that he quit smoking about 37 years ago. His smoking use included Cigarettes. He has a 20 pack-year smoking history. He has never used smokeless tobacco. He reports that he does not drink alcohol or use illicit drugs. family history includes Cancer in his father; Diabetes in his brother; Other (age of onset: 59) in his mother. There is no history of Coronary artery disease or Colon cancer. Allergies  Allergen Reactions  . Gemfibrozil     REACTION: unspecified  . Lipitor [Atorvastatin]     Uneasy feeling  . Niacin     REACTION: unspecified  . Penicillins     REACTION: unspecified  . Statins Other (See  Comments)    myalgias  . Valacyclovir Hcl     REACTION: unspecified   Current Outpatient Prescriptions on File Prior to Visit  Medication Sig Dispense Refill  . brimonidine (ALPHAGAN P) 0.1 % SOLN as directed.      . Clobetasol Propionate (CLOBEX SPRAY) 0.05 % external spray Apply topically as needed.      Marland Kitchen lisinopril (PRINIVIL,ZESTRIL) 20 MG tablet take 1 tablet by mouth once daily 90 tablet 3  . multivitamin (THERAGRAN) tablet Take 1 tablet by mouth daily.      Marland Kitchen aspirin 81 MG tablet Take 1 tablet (81 mg total) by mouth daily. (Patient not taking: Reported on 11/28/2014) 30 tablet   . FLUZONE HIGH-DOSE 0.5 ML SUSY   0   No current facility-administered medications on file prior to visit.    Review of Systems Constitutional: Negative for increased diaphoresis, other activity, appetite or siginficant weight change other than noted HENT: Negative for worsening hearing loss, ear pain, facial swelling, mouth sores and neck stiffness.   Eyes: Negative for other worsening pain, redness or visual disturbance.  Respiratory: Negative for shortness of breath and wheezing  Cardiovascular: Negative for chest pain and palpitations.  Gastrointestinal: Negative for diarrhea, blood in stool, abdominal distention or other pain Genitourinary: Negative for hematuria, flank pain or change in urine volume.  Musculoskeletal: Negative for myalgias or other joint complaints.  Skin:  Negative for color change and wound or drainage.  Neurological: Negative for syncope and numbness. other than noted Hematological: Negative for adenopathy. or other swelling Psychiatric/Behavioral: Negative for hallucinations, SI, self-injury, decreased concentration or other worsening agitation.      Objective:   Physical Exam BP 118/74 mmHg  Pulse 79  Temp(Src) 97.7 F (36.5 C) (Oral)  Ht 6' (1.829 m)  Wt 191 lb (86.637 kg)  BMI 25.90 kg/m2  SpO2 98% VS noted,  Constitutional: Pt is oriented to person, place, and  time. Appears well-developed and well-nourished, in no significant distress Head: Normocephalic and atraumatic.  Right Ear: External ear normal.  Left Ear: External ear normal.  Nose: Nose normal.  Mouth/Throat: Oropharynx is clear and moist.  Eyes: Conjunctivae and EOM are normal. Pupils are equal, round, and reactive to light.  Neck: Normal range of motion. Neck supple. No JVD present. No tracheal deviation present or significant neck LA or mass Cardiovascular: Normal rate, regular rhythm, normal heart sounds and intact distal pulses.   Pulmonary/Chest: Effort normal and breath sounds without rales or wheezing  Abdominal: Soft. Bowel sounds are normal. NT. No HSM  Musculoskeletal: Normal range of motion. Exhibits no edema.  Lymphadenopathy:  Has no cervical adenopathy.  Neurological: Pt is alert and oriented to person, place, and time. Pt has normal reflexes. No cranial nerve deficit. Motor grossly intact Skin: Skin is warm and dry. No rash noted.  Psychiatric:  Has normal mood and affect. Behavior is normal.     Assessment & Plan:

## 2014-12-10 DIAGNOSIS — H3530 Unspecified macular degeneration: Secondary | ICD-10-CM | POA: Diagnosis not present

## 2014-12-10 DIAGNOSIS — H4011X3 Primary open-angle glaucoma, severe stage: Secondary | ICD-10-CM | POA: Diagnosis not present

## 2014-12-10 DIAGNOSIS — H47233 Glaucomatous optic atrophy, bilateral: Secondary | ICD-10-CM | POA: Diagnosis not present

## 2014-12-11 DIAGNOSIS — H47219 Primary optic atrophy, unspecified eye: Secondary | ICD-10-CM | POA: Diagnosis not present

## 2014-12-11 DIAGNOSIS — Z8673 Personal history of transient ischemic attack (TIA), and cerebral infarction without residual deficits: Secondary | ICD-10-CM | POA: Diagnosis not present

## 2014-12-11 DIAGNOSIS — R930 Abnormal findings on diagnostic imaging of skull and head, not elsewhere classified: Secondary | ICD-10-CM | POA: Diagnosis not present

## 2014-12-21 DIAGNOSIS — N401 Enlarged prostate with lower urinary tract symptoms: Secondary | ICD-10-CM | POA: Diagnosis not present

## 2014-12-26 DIAGNOSIS — R972 Elevated prostate specific antigen [PSA]: Secondary | ICD-10-CM | POA: Diagnosis not present

## 2014-12-26 DIAGNOSIS — N401 Enlarged prostate with lower urinary tract symptoms: Secondary | ICD-10-CM | POA: Diagnosis not present

## 2014-12-26 DIAGNOSIS — N138 Other obstructive and reflux uropathy: Secondary | ICD-10-CM | POA: Diagnosis not present

## 2014-12-26 DIAGNOSIS — R350 Frequency of micturition: Secondary | ICD-10-CM | POA: Diagnosis not present

## 2015-01-02 ENCOUNTER — Other Ambulatory Visit: Payer: Self-pay | Admitting: Internal Medicine

## 2015-01-03 ENCOUNTER — Other Ambulatory Visit: Payer: Self-pay | Admitting: Internal Medicine

## 2015-01-05 DIAGNOSIS — Z23 Encounter for immunization: Secondary | ICD-10-CM | POA: Diagnosis not present

## 2015-01-28 DIAGNOSIS — H401133 Primary open-angle glaucoma, bilateral, severe stage: Secondary | ICD-10-CM | POA: Diagnosis not present

## 2015-03-19 DIAGNOSIS — H3530 Unspecified macular degeneration: Secondary | ICD-10-CM | POA: Diagnosis not present

## 2015-03-19 DIAGNOSIS — H47233 Glaucomatous optic atrophy, bilateral: Secondary | ICD-10-CM | POA: Diagnosis not present

## 2015-03-19 DIAGNOSIS — H04123 Dry eye syndrome of bilateral lacrimal glands: Secondary | ICD-10-CM | POA: Diagnosis not present

## 2015-07-03 ENCOUNTER — Encounter: Payer: Self-pay | Admitting: Internal Medicine

## 2015-07-03 ENCOUNTER — Ambulatory Visit (INDEPENDENT_AMBULATORY_CARE_PROVIDER_SITE_OTHER): Payer: Medicare Other | Admitting: Internal Medicine

## 2015-07-03 VITALS — BP 140/84 | HR 78 | Temp 98.6°F | Resp 20 | Wt 189.0 lb

## 2015-07-03 DIAGNOSIS — I10 Essential (primary) hypertension: Secondary | ICD-10-CM | POA: Diagnosis not present

## 2015-07-03 DIAGNOSIS — E785 Hyperlipidemia, unspecified: Secondary | ICD-10-CM | POA: Diagnosis not present

## 2015-07-03 DIAGNOSIS — M722 Plantar fascial fibromatosis: Secondary | ICD-10-CM | POA: Diagnosis not present

## 2015-07-03 MED ORDER — TRAMADOL HCL 50 MG PO TABS: 50.0000 mg | ORAL_TABLET | Freq: Four times a day (QID) | ORAL | Status: DC | PRN

## 2015-07-03 NOTE — Progress Notes (Signed)
Pre visit review using our clinic review tool, if applicable. No additional management support is needed unless otherwise documented below in the visit note. 

## 2015-07-03 NOTE — Patient Instructions (Signed)
Please take all new medication as prescribed   - the tramadol as needed for pain  Please consider getting new Shoe Inserts such as at [facility] to replace your current shoe inserts that came with your shoe  Please continue all other medications as before, and refills have been done if requested.  Please have the pharmacy call with any other refills you may need.  Please keep your appointments with your specialists as you may have planned  Please make an Appointment with Dr Tamala Julian for the feet tendonitis

## 2015-07-03 NOTE — Assessment & Plan Note (Signed)
stable overall by history and exam, recent data reviewed with pt, and pt to continue medical treatment as before,  to f/u any worsening symptoms or concerns BP Readings from Last 3 Encounters:  07/03/15 140/84  12/05/14 118/74  11/28/14 116/70

## 2015-07-03 NOTE — Progress Notes (Signed)
Subjective:    Patient ID: [name] Good, male    DOB: February 18, 1941, 75 y.o.   MRN: [id number]  HPI  Here with bilat feet pain x 2 wk mod to severe, located plantar and toes, sharp and dull at times, intermittent, worse to walk more even 0.3 miles at the gym, absorbine plus and tylneol helps somewhat.  Has had similar pain a few yrs ago, not as severe, saw podiatry at that time, better with cortisone.  No trauma or swelling.  Pt denies new neurological symptoms such as new headache, or facial or extremity weakness or numbness   Pt denies polydipsia, polyuria  Admits to non adherence to low chol diet - eats eggs daily for bfast Past Medical History  Diagnosis Date  . Hypertension   . Hyperlipidemia   . Balanitis     hx of  . Osteoarthritis     diffuse  . Cataract   . Glaucoma   . Stroke [facility])     pt states ? when had  . Hx of adenomatous polyp of colon 03/09/2014   Past Surgical History  Procedure Laterality Date  . Cataract extraction    . Tonsillectomy    . Colonoscopy  06-05-2003    hems only    reports that he quit smoking about 38 years ago. His smoking use included Cigarettes. He has a 20 pack-year smoking history. He has never used smokeless tobacco. He reports that he does not drink alcohol or use illicit drugs. family history includes Cancer in his father; Diabetes in his brother; Other (age of onset: 64) in his mother. There is no history of Coronary artery disease or Colon cancer. Allergies  Allergen Reactions  . Gemfibrozil     REACTION: unspecified  . Lipitor [Atorvastatin]     Uneasy feeling  . Niacin     REACTION: unspecified  . Penicillins     REACTION: unspecified  . Statins Other (See Comments)    myalgias  . Valacyclovir Hcl     REACTION: unspecified   Current Outpatient Prescriptions on File Prior to Visit  Medication Sig Dispense Refill  . aspirin 81 MG tablet Take 1 tablet (81 mg total) by mouth daily. 30 tablet   . brimonidine (ALPHAGAN P)  0.1 % SOLN as directed.      . Clobetasol Propionate (CLOBEX SPRAY) 0.05 % external spray Apply topically as needed.      Marland Kitchen FLUZONE HIGH-DOSE 0.5 ML SUSY   0  . lisinopril (PRINIVIL,ZESTRIL) 20 MG tablet take 1 tablet by mouth once daily 90 tablet 3  . multivitamin (THERAGRAN) tablet Take 1 tablet by mouth daily.       No current facility-administered medications on file prior to visit.   Review of Systems  Constitutional: Negative for unusual diaphoresis or night sweats HENT: Negative for ringing in ear or discharge Eyes: Negative for double vision or worsening visual disturbance.  Respiratory: Negative for choking and stridor.   Gastrointestinal: Negative for vomiting or other signifcant bowel change Genitourinary: Negative for hematuria or change in urine volume.  Musculoskeletal: Negative for other MSK pain or swelling Skin: Negative for color change and worsening wound.  Neurological: Negative for tremors and numbness other than noted  Psychiatric/Behavioral: Negative for decreased concentration or agitation other than above       Objective:   Physical Exam BP 140/84 mmHg  Pulse 78  Temp(Src) 98.6 F (37 C) (Oral)  Resp 20  Wt 189 lb (85.73 kg)  SpO2 97%  VS noted,  Constitutional: Pt appears in no significant distress HENT: Head: NCAT.  Right Ear: External ear normal.  Left Ear: External ear normal.  Eyes: . Pupils are equal, round, and reactive to light. Conjunctivae and EOM are normal Neck: Normal range of motion. Neck supple.  Cardiovascular: Normal rate and regular rhythm.   Pulmonary/Chest: Effort normal and breath sounds without rales or wheezing.  Neurological: Pt is alert. Not confused , motor grossly intact Skin: Skin is warm. No rash, no LE edema Psychiatric: Pt behavior is normal. No agitation.  Bilat plantar tenderness noted worse at the plantar heels       Assessment & Plan:

## 2015-07-03 NOTE — Assessment & Plan Note (Signed)
D/w pt -  To lower eggs, fried foods, red meats as o/w stable overall by history and exam, recent data reviewed with pt, and pt to continue medical treatment as before,  to f/u any worsening symptoms or concerns, has been statin intolerant Lab Results  Component Value Date   LDLCALC 148* 12/05/2014

## 2015-07-08 DIAGNOSIS — H401133 Primary open-angle glaucoma, bilateral, severe stage: Secondary | ICD-10-CM | POA: Diagnosis not present

## 2015-07-08 DIAGNOSIS — H1010 Acute atopic conjunctivitis, unspecified eye: Secondary | ICD-10-CM | POA: Diagnosis not present

## 2015-07-10 ENCOUNTER — Encounter: Payer: Self-pay | Admitting: Family Medicine

## 2015-07-10 ENCOUNTER — Ambulatory Visit (INDEPENDENT_AMBULATORY_CARE_PROVIDER_SITE_OTHER): Payer: Medicare Other | Admitting: Family Medicine

## 2015-07-10 ENCOUNTER — Other Ambulatory Visit (INDEPENDENT_AMBULATORY_CARE_PROVIDER_SITE_OTHER): Payer: Medicare Other

## 2015-07-10 VITALS — BP 132/78 | HR 78 | Wt 192.0 lb

## 2015-07-10 DIAGNOSIS — M79672 Pain in left foot: Secondary | ICD-10-CM

## 2015-07-10 DIAGNOSIS — M79671 Pain in right foot: Secondary | ICD-10-CM

## 2015-07-10 DIAGNOSIS — M19079 Primary osteoarthritis, unspecified ankle and foot: Secondary | ICD-10-CM | POA: Diagnosis not present

## 2015-07-10 NOTE — Progress Notes (Signed)
[facility] [place] [address] [place], [place] [address] Phone: [phone] Subjective:    I'm seeing this patient by the request  of:  Cathlean Cower, MD   CC: bilateral foot pain  [id number] [name] Good is a 75 y.o. male coming in with complaint of bilateral foot pain. This is a new problem for patient. Patient has seen a podiatrist previously and has been told that he has tendinitis. Patient has been given different injection is has not made any significant improvement. Patient did try some over-the-counter orthotics recently and states that this is been the most helpful. Patient states though that unfortunately even with this short walking he has more pain. Past medical history significant for polymyalgia rheumatica. Would like to stay away from any prednisone at this point. Has tramadol for breakthrough pain that does seem to help but likes to not take it because sometimes he has side effects. Patient states it can be associated with swelling. Denies any numbness. Rates the severity of pain a 6 out of 10.     Past Medical History  Diagnosis Date  . Hypertension   . Hyperlipidemia   . Balanitis     hx of  . Osteoarthritis     diffuse  . Cataract   . Glaucoma   . Stroke [facility])     pt states ? when had  . Hx of adenomatous polyp of colon 03/09/2014   Past Surgical History  Procedure Laterality Date  . Cataract extraction    . Tonsillectomy    . Colonoscopy  06-05-2003    hems only   Social History   Social History  . Marital Status: Married    Spouse Name: N/A  . Number of Children: 0  . Years of Education: 18   Occupational History  . PASTOR    Social History Main Topics  . Smoking status: Former Smoker -- 1.00 packs/day for 20 years    Types: Cigarettes    Quit date: 04/20/1977  . Smokeless tobacco: Never Used  . Alcohol Use: No  . Drug Use: No  . Sexual Activity:    Partners: Female   Other Topics Concern  . None    Social History Narrative   HSG, [employer], [place]. Married '62.    No children. 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 End of Life Care - provided packet (July '12)      Minister; Still pastor      Allergies  Allergen Reactions  . Gemfibrozil     REACTION: unspecified  . Lipitor [Atorvastatin]     Uneasy feeling  . Niacin     REACTION: unspecified  . Penicillins     REACTION: unspecified  . Statins Other (See Comments)    myalgias  . Valacyclovir Hcl     REACTION: unspecified   Family History  Problem Relation Age of Onset  . Other Mother 30    CVA  . Cancer Father     lung  . Diabetes Brother   . Coronary artery disease Neg Hx   . Colon cancer Neg Hx     Past medical history, social, surgical and family history all reviewed in electronic medical record.  No pertanent information unless stated regarding to the chief complaint.   Review of Systems: No headache, visual changes, nausea, vomiting, diarrhea, constipation, dizziness, abdominal pain, skin rash, fevers, chills, night sweats, weight loss, swollen lymph nodes, body aches, joint swelling, muscle aches, chest pain,  shortness of breath, mood changes.   Objective Blood pressure 132/78, pulse 78, weight 192 lb (87.091 kg).  General: No apparent distress alert and oriented x3 mood and affect normal, dressed appropriately.  HEENT:erythema of the eyes bilaterally Cardiovascular: No lower extremity edema, non tender, no erythema  Skin: Warm dry intact with no signs of infection or rash on extremities or on axial skeleton.  Abdomen: Soft nontender  Neuro: Cranial nerves II through XII are intact, neurovascularly intact in all extremities with 2+ DTRs and 2+ pulses.  Lymph: No lymphadenopathy of posterior or anterior cervical chain or axillae bilaterally.  Gait normal with good balance and coordination.  MSK:  Non tender with full range of motion and good stability and symmetric strength and tone of  shoulders, elbows, wrist, hip, knee and ankles bilaterally. Significant arthritic changes of multiple joints. Foot exam shows the patient does have severe breakdown of the longitudinal and transverse arch. Mild overpronation of the feet bilaterally. Mild tenderness to palpation over the dorsal aspect over the metatarsal heads as well as the plantar aspect. Positive squeeze test. Neurovascularly intact distally. Trace 1+ effusion to midcalf bilaterally.  Limited musculoskeletal ultrasound was performed and interpreted by Hulan Saas, M  Limited ultrasound shows the patient does have severe midfoot arthritis as well as some arthritis of the interphalangeal joints. Soft tissue swelling noted. Light is noted. Minimal tenosynovitis of the extensor tendons of the toes. Mild bursitis on the plantar aspect of the metatarsal heads. Impression: Arthritic foot with bursitis and mild tenosynovitis.   Impression and Recommendations:     This case required medical decision making of moderate complexity.      Note: This dictation was prepared with Dragon dictation along with smaller phrase technology. Any transcriptional errors that result from this process are unintentional.

## 2015-07-10 NOTE — Assessment & Plan Note (Signed)
Patient's foot does have moderate to severe arthritic changes. We discussed that there is some reactive tenosynovitis as well as possible bursitis. I do not think that this is related to the polymyalgia rheumatica. We may need to consider this though and made think treatment otherwise. Patient given topical anti-inflammatories, icing, as well as we discussed proper shoes and over-the-counter orthotics. I do not think with his feet he is a candidate for custom orthotics because I do think that the rigidity of them would cause more pain. We discussed different shoes and to be overall. Patient will try to make these changes and come back and see me again in 4 weeks.

## 2015-07-10 NOTE — Patient Instructions (Signed)
Great to see you  [place]co orthotics at [facility] could help the feet.  Ice 10 minutes at night when feet are hurting pennsaid pinkie amount topically 2 times daily as needed.  Vitamin D 2000 IU daily  Continue the turmeric  Tart cherry extract at night, any dose can help with the arthritis pain  Avoid being barefoot and get more rigid shoes for in the house Lace shoes differently so the last eye is not used.  Compression socks at [facility] could help or at [facility] as well.  See me again in 4 weeks.

## 2015-08-07 ENCOUNTER — Encounter: Payer: Self-pay | Admitting: Family Medicine

## 2015-08-07 ENCOUNTER — Ambulatory Visit (INDEPENDENT_AMBULATORY_CARE_PROVIDER_SITE_OTHER): Payer: Medicare Other | Admitting: Family Medicine

## 2015-08-07 VITALS — BP 138/82 | HR 68 | Wt 184.0 lb

## 2015-08-07 DIAGNOSIS — M19079 Primary osteoarthritis, unspecified ankle and foot: Secondary | ICD-10-CM | POA: Diagnosis not present

## 2015-08-07 DIAGNOSIS — M353 Polymyalgia rheumatica: Secondary | ICD-10-CM

## 2015-08-07 NOTE — Patient Instructions (Signed)
Great to see you  Looking great  Never be barefoot Go to the shoe market and ask for a metatarsal pad to pu ton your orthotic COnitnue the vitamns Keep doing what you are doing See me when you need me/

## 2015-08-07 NOTE — Assessment & Plan Note (Signed)
Patient's underlying arthritis is likely contributing to a decent amount of his pain. Discuss that he may have exacerbation from time to time. Avoid being barefoot. Continues to wear the good support. Patient has had topical anti-inflammatories and given more for any breakthrough pain. Follow-up more on an as-needed basis as long as he does well.

## 2015-08-07 NOTE — Progress Notes (Signed)
[name] [address] [place], [place] [address] Phone: [phone] Subjective:      CC: bilateral foot pain f/u  [id number] [name] Good is a 75 y.o. male coming in with complaint of bilateral foot pain. Patient also had a past mental history for polymyalgia rheumatica. Patient was found to have significant's tenosynovitis, bursitis as well as midfoot and forefoot arthritis. Patient was given topical anti-inflammatories and we discussed over-the-counter medications. We discussed over-the-counter insoles as well as proper shoes. Patient has made almost changes except for icing protocol. Patient states that he is feeling 80% better. Still has some mild discomfort if he is on his feet a long time but nothing that is stopping him from activities. Patient states that he is able to walk a significant more. Less pain at night. Has not used his tramadol for any breakthrough pain and quite some time. Denies any swelling. Denies any numbness. States that overall is feeling much better and happy with the results.     Past Medical History  Diagnosis Date  . Hypertension   . Hyperlipidemia   . Balanitis     hx of  . Osteoarthritis     diffuse  . Cataract   . Glaucoma   . Stroke [facility])     pt states ? when had  . Hx of adenomatous polyp of colon 03/09/2014   Past Surgical History  Procedure Laterality Date  . Cataract extraction    . Tonsillectomy    . Colonoscopy  06-05-2003    hems only   Social History   Social History  . Marital Status: Married    Spouse Name: N/A  . Number of Children: 0  . Years of Education: 18   Occupational History  . PASTOR    Social History Main Topics  . Smoking status: Former Smoker -- 1.00 packs/day for 20 years    Types: Cigarettes    Quit date: 04/20/1977  . Smokeless tobacco: Never Used  . Alcohol Use: No  . Drug Use: No  . Sexual Activity:    Partners: Female   Other Topics Concern  . None    Social History Narrative   HSG, [employer], [place]. Married '62.    No children. 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 End of Life Care - provided packet (July '12)      Minister; Still pastor      Allergies  Allergen Reactions  . Gemfibrozil     REACTION: unspecified  . Lipitor [Atorvastatin]     Uneasy feeling  . Niacin     REACTION: unspecified  . Penicillins     REACTION: unspecified  . Statins Other (See Comments)    myalgias  . Valacyclovir Hcl     REACTION: unspecified   Family History  Problem Relation Age of Onset  . Other Mother 60    CVA  . Cancer Father     lung  . Diabetes Brother   . Coronary artery disease Neg Hx   . Colon cancer Neg Hx     Past medical history, social, surgical and family history all reviewed in electronic medical record.  No pertanent information unless stated regarding to the chief complaint.   Review of Systems: No headache, visual changes, nausea, vomiting, diarrhea, constipation, dizziness, abdominal pain, skin rash, fevers, chills, night sweats, weight loss, swollen lymph nodes, body aches, joint swelling, muscle aches, chest pain, shortness of breath, mood changes.   Objective  Blood pressure 138/82, pulse 68, weight 184 lb (83.462 kg).  General: No apparent distress alert and oriented x3 mood and affect normal, dressed appropriately.  HEENT:erythema of the eyes bilaterally Cardiovascular: No lower extremity edema, non tender, no erythema  Skin: Warm dry intact with no signs of infection or rash on extremities or on axial skeleton.  Abdomen: Soft nontender  Neuro: Cranial nerves II through XII are intact, neurovascularly intact in all extremities with 2+ DTRs and 2+ pulses.  Lymph: No lymphadenopathy of posterior or anterior cervical chain or axillae bilaterally.  Gait normal with good balance and coordination.  MSK:  Non tender with full range of motion and good stability and symmetric strength and tone of  shoulders, elbows, wrist, hip, knee and ankles bilaterally. Significant arthritic changes of multiple joints. Foot exam shows the patient does have severe breakdown of the longitudinal and transverse arch. Mild overpronation of the feet bilaterally. nontender on exam. No significant swelling noted today. Neurovascular intact distally with a negative squeeze test. .   Impression and Recommendations:     This case required medical decision making of moderate complexity.      Note: This dictation was prepared with Dragon dictation along with smaller phrase technology. Any transcriptional errors that result from this process are unintentional.

## 2015-08-07 NOTE — Assessment & Plan Note (Signed)
Encourage him to continue vitamin supplementations.

## 2015-08-08 ENCOUNTER — Telehealth: Payer: Self-pay

## 2015-08-08 NOTE — Telephone Encounter (Signed)
Spoke to pt, advised him that I will leave samples of pennsaid at the front desk for him to come by & get.

## 2015-08-08 NOTE — Telephone Encounter (Signed)
Patient forgot to pick up his samples yesterday when he left. He would like to come pick them up. Please follow up. Thank you.

## 2015-08-09 DIAGNOSIS — H401233 Low-tension glaucoma, bilateral, severe stage: Secondary | ICD-10-CM | POA: Diagnosis not present

## 2015-10-23 ENCOUNTER — Telehealth: Payer: Self-pay | Admitting: *Deleted

## 2015-10-23 NOTE — Telephone Encounter (Signed)
Pharmacist left msg on triage wanting top verify pt immunizations esp zostervax. Called pharmacist bck inform pt is updated on all vaccinations. Gave dates to update their records...[name] Good

## 2015-11-11 DIAGNOSIS — H18419 Arcus senilis, unspecified eye: Secondary | ICD-10-CM | POA: Diagnosis not present

## 2015-11-11 DIAGNOSIS — H5213 Myopia, bilateral: Secondary | ICD-10-CM | POA: Diagnosis not present

## 2015-11-11 DIAGNOSIS — H5203 Hypermetropia, bilateral: Secondary | ICD-10-CM | POA: Diagnosis not present

## 2015-11-14 DIAGNOSIS — L409 Psoriasis, unspecified: Secondary | ICD-10-CM | POA: Diagnosis not present

## 2015-11-14 DIAGNOSIS — D485 Neoplasm of uncertain behavior of skin: Secondary | ICD-10-CM | POA: Diagnosis not present

## 2015-11-14 DIAGNOSIS — B078 Other viral warts: Secondary | ICD-10-CM | POA: Diagnosis not present

## 2015-11-28 DIAGNOSIS — L409 Psoriasis, unspecified: Secondary | ICD-10-CM | POA: Diagnosis not present

## 2015-12-07 IMAGING — CR DG KNEE STANDING AP BILAT
1 series · 1 of 1 positions shown · non-contrast
Comparison: None.

CLINICAL DATA: Left knee pain 6 months.  No injury.

EXAM:
BILATERAL KNEES STANDING - 1 VIEW

[view not recorded]
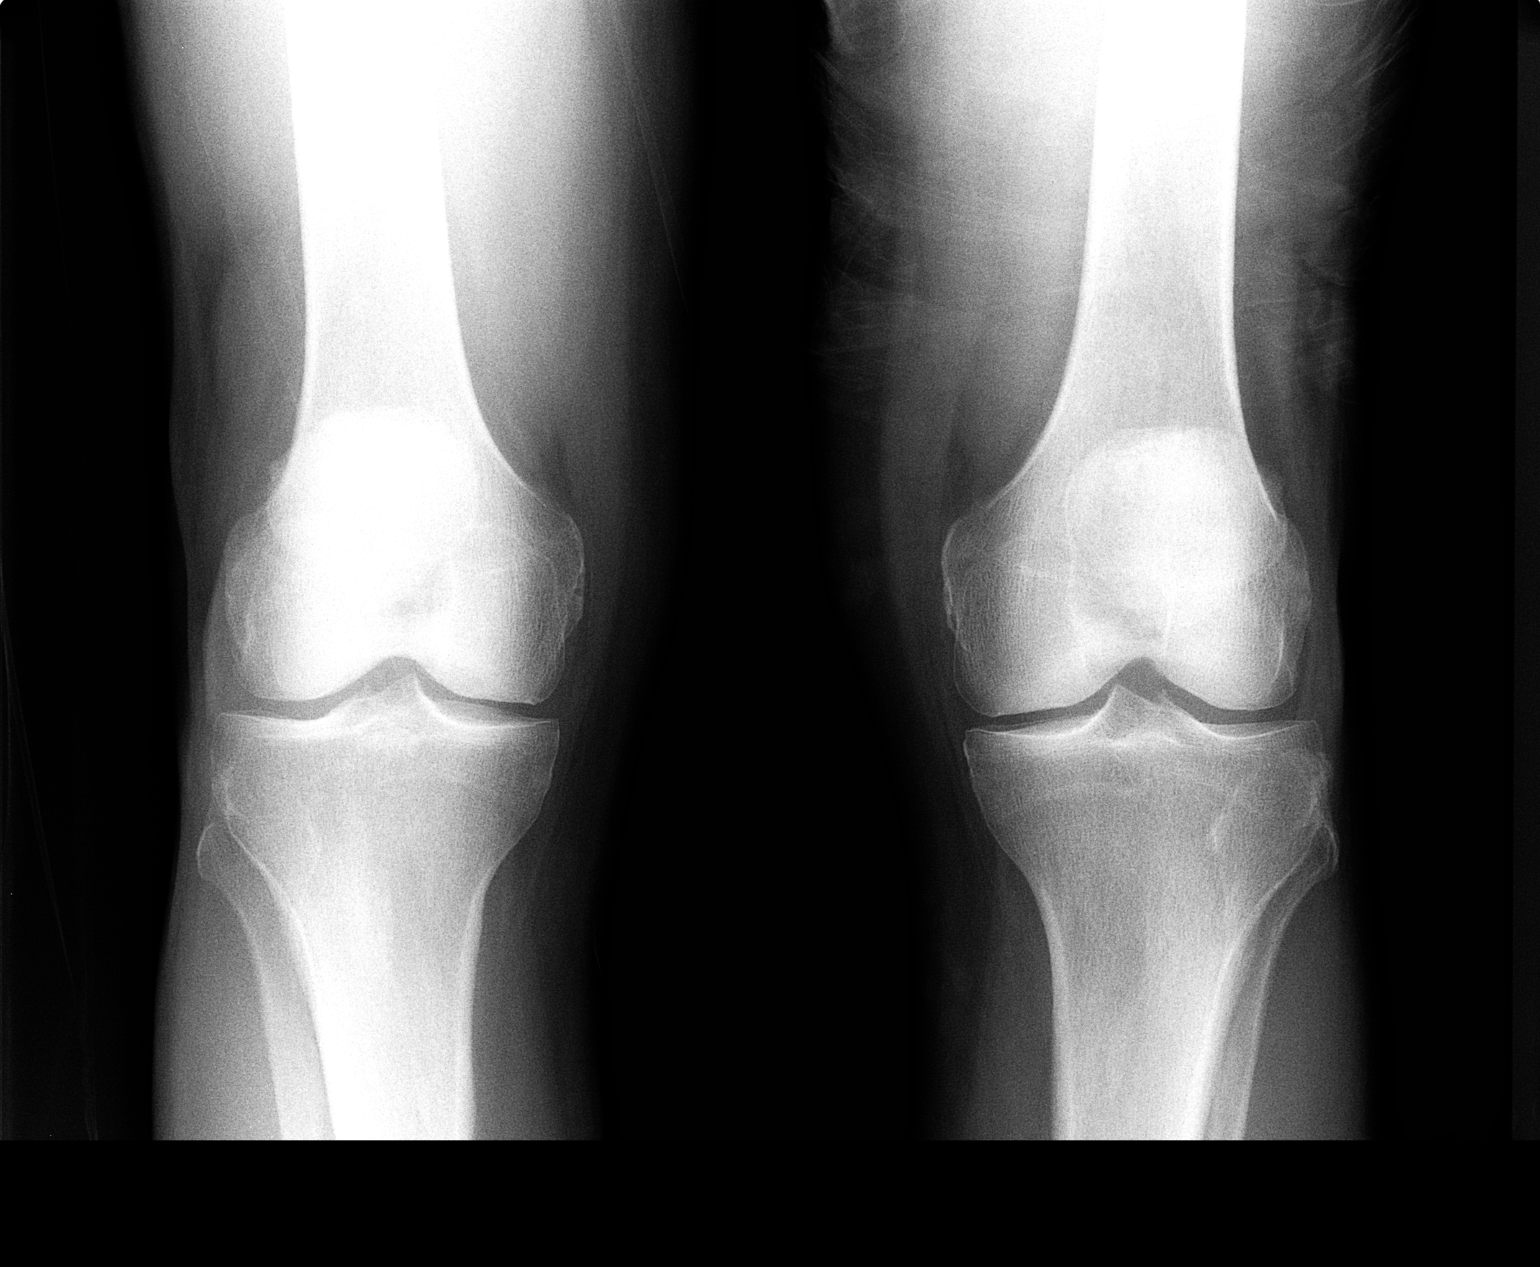

[1 of 1 positions shown; findings below may reference images not displayed]

FINDINGS: Bones, joint spaces and soft tissues are within normal.
IMPRESSION: Negative.

## 2015-12-10 ENCOUNTER — Other Ambulatory Visit (INDEPENDENT_AMBULATORY_CARE_PROVIDER_SITE_OTHER): Payer: Medicare Other

## 2015-12-10 ENCOUNTER — Encounter: Payer: Self-pay | Admitting: Internal Medicine

## 2015-12-10 ENCOUNTER — Ambulatory Visit (INDEPENDENT_AMBULATORY_CARE_PROVIDER_SITE_OTHER): Payer: Medicare Other | Admitting: Internal Medicine

## 2015-12-10 ENCOUNTER — Other Ambulatory Visit: Payer: Self-pay | Admitting: Internal Medicine

## 2015-12-10 VITALS — BP 138/80 | HR 65 | Temp 97.8°F | Resp 20 | Wt 182.0 lb

## 2015-12-10 DIAGNOSIS — Z418 Encounter for other procedures for purposes other than remedying health state: Secondary | ICD-10-CM | POA: Diagnosis not present

## 2015-12-10 DIAGNOSIS — Z23 Encounter for immunization: Secondary | ICD-10-CM

## 2015-12-10 DIAGNOSIS — I10 Essential (primary) hypertension: Secondary | ICD-10-CM

## 2015-12-10 DIAGNOSIS — M19079 Primary osteoarthritis, unspecified ankle and foot: Secondary | ICD-10-CM

## 2015-12-10 DIAGNOSIS — M353 Polymyalgia rheumatica: Secondary | ICD-10-CM

## 2015-12-10 DIAGNOSIS — E785 Hyperlipidemia, unspecified: Secondary | ICD-10-CM

## 2015-12-10 DIAGNOSIS — R972 Elevated prostate specific antigen [PSA]: Secondary | ICD-10-CM

## 2015-12-10 DIAGNOSIS — Z299 Encounter for prophylactic measures, unspecified: Secondary | ICD-10-CM

## 2015-12-10 LAB — BASIC METABOLIC PANEL
BUN: 19 mg/dL (ref 6–23)
CALCIUM: 9.2 mg/dL (ref 8.4–10.5)
CHLORIDE: 102 meq/L (ref 96–112)
CO2: 29 meq/L (ref 19–32)
Creatinine, Ser: 1.01 mg/dL (ref 0.40–1.50)
GFR: 92.42 mL/min (ref >60.00)
Glucose, Bld: 100 mg/dL — ABNORMAL HIGH (ref 70–99)
POTASSIUM: 4.4 meq/L (ref 3.5–5.1)
SODIUM: 139 meq/L (ref 135–145)

## 2015-12-10 LAB — LIPID PANEL
Cholesterol: 239 mg/dL — ABNORMAL HIGH (ref 0–200)
HDL: 60.6 mg/dL (ref >39.00)
LDL Cholesterol: 151 mg/dL — ABNORMAL HIGH (ref 0–99)
NONHDL: 177.96
Total CHOL/HDL Ratio: 4
Triglycerides: 135 mg/dL (ref 0.0–149.0)
VLDL: 27 mg/dL (ref 0.0–40.0)

## 2015-12-10 LAB — CBC WITH DIFFERENTIAL/PLATELET
BASOS PCT: 0.7 % (ref 0.0–3.0)
Basophils Absolute: 0 10*3/uL (ref 0.0–0.1)
EOS PCT: 3.9 % (ref 0.0–5.0)
Eosinophils Absolute: 0.2 10*3/uL (ref 0.0–0.7)
HEMATOCRIT: 39.1 % (ref 39.0–52.0)
HEMOGLOBIN: 13.4 g/dL (ref 13.0–17.0)
LYMPHS PCT: 25.6 % (ref 12.0–46.0)
Lymphs Abs: 1.3 10*3/uL (ref 0.7–4.0)
MCHC: 34.2 g/dL (ref 30.0–36.0)
MCV: 83.5 fl (ref 78.0–100.0)
MONOS PCT: 11 % (ref 3.0–12.0)
Monocytes Absolute: 0.6 10*3/uL (ref 0.1–1.0)
Neutro Abs: 2.9 10*3/uL (ref 1.4–7.7)
Neutrophils Relative %: 58.8 % (ref 43.0–77.0)
Platelets: 264 10*3/uL (ref 150.0–400.0)
RBC: 4.69 Mil/uL (ref 4.22–5.81)
RDW: 12.8 % (ref 11.5–15.5)
WBC: 5 10*3/uL (ref 4.0–10.5)

## 2015-12-10 LAB — TSH: TSH: 1.06 u[IU]/mL (ref 0.35–4.50)

## 2015-12-10 LAB — HEPATIC FUNCTION PANEL
ALK PHOS: 67 U/L (ref 39–117)
ALT: 13 U/L (ref 0–53)
AST: 15 U/L (ref 0–37)
Albumin: 4.3 g/dL (ref 3.5–5.2)
BILIRUBIN DIRECT: 0.1 mg/dL (ref 0.0–0.3)
TOTAL PROTEIN: 7.1 g/dL (ref 6.0–8.3)
Total Bilirubin: 0.5 mg/dL (ref 0.2–1.2)

## 2015-12-10 LAB — URINALYSIS, ROUTINE W REFLEX MICROSCOPIC
Bilirubin Urine: NEGATIVE
Hgb urine dipstick: NEGATIVE
KETONES UR: NEGATIVE
LEUKOCYTES UA: NEGATIVE
NITRITE: NEGATIVE
RBC / HPF: NONE SEEN (ref 0–?)
Specific Gravity, Urine: 1.025 (ref 1.000–1.030)
TOTAL PROTEIN, URINE-UPE24: NEGATIVE
URINE GLUCOSE: NEGATIVE
Urobilinogen, UA: 0.2 (ref 0.0–1.0)
pH: 6 (ref 5.0–8.0)

## 2015-12-10 LAB — C-REACTIVE PROTEIN: CRP: 2.4 mg/dL (ref 0.5–20.0)

## 2015-12-10 LAB — SEDIMENTATION RATE: Sed Rate: 42 mm/hr — ABNORMAL HIGH (ref 0–20)

## 2015-12-10 LAB — PSA: PSA: 4.06 ng/mL — ABNORMAL HIGH (ref 0.10–4.00)

## 2015-12-10 MED ORDER — ASPIRIN EC 81 MG PO TBEC: 81.0000 mg | DELAYED_RELEASE_TABLET | Freq: Every day | ORAL | 11 refills | Status: DC

## 2015-12-10 MED ORDER — ROSUVASTATIN CALCIUM 20 MG PO TABS: 20.0000 mg | ORAL_TABLET | Freq: Every day | ORAL | 3 refills | Status: DC

## 2015-12-10 NOTE — Assessment & Plan Note (Signed)
Now worsening symptomatic, for f/u dr Tamala Julian as planned

## 2015-12-10 NOTE — Assessment & Plan Note (Signed)
stable overall by history and exam, recent data reviewed with pt, and pt to continue medical treatment as before,  to f/u any worsening symptoms or concerns' . BP Readings from Last 3 Encounters:  12/10/15 138/80  08/07/15 138/82  07/10/15 132/78

## 2015-12-10 NOTE — Assessment & Plan Note (Signed)
With mild BPH symptoms and nocturia, has f/u appt with urology planned, asked for psa as well

## 2015-12-10 NOTE — Assessment & Plan Note (Signed)
With some recurring aching recently, for esr/crp, consider predpac asd, f/u Dr Smith/[facility]port [facility] as well

## 2015-12-10 NOTE — Assessment & Plan Note (Signed)
stable overall by history and exam, recent data reviewed with pt, and pt to continue medical treatment as before,  to f/u any worsening symptoms or concerns Lab Results  Component Value Date   LDLCALC 148 (H) 12/05/2014   elev last yr, did not want statin, but willing to try this yr if persistnet

## 2015-12-10 NOTE — Progress Notes (Signed)
Subjective:    Patient ID: [name] Good, male    DOB: 07-14-1940, 75 y.o.   MRN: [id number]  HPI  Here for yearly f/u;  Overall doing ok;  Pt denies Chest pain, worsening SOB, DOE, wheezing, orthopnea, PND, worsening LE edema, palpitations, dizziness or syncope.  Pt denies neurological change such as new headache, facial or extremity weakness.  Pt denies polydipsia, polyuria, or low sugar symptoms. Pt states overall good compliance with treatment and medications, good tolerability, and has been trying to follow appropriate diet.  Pt denies worsening depressive symptoms, suicidal ideation or panic. No fever, night sweats, wt loss, loss of appetite, or other constitutional symptoms.  Pt states good ability with ADL's, has low fall risk, home safety reviewed and adequate, no other significant changes in hearing or vision except has glaucoma, reduced vision, no longer drives, vision now [id number] both eye with glasses, and only occasionally active with exercise.  Gets marked pain to the toes [facility], Has seen podiatry in the past.  Plans to f/u with Dr Tamala Julian regarding any change to orthotics.   Has ongoing nocturia 3-4 times per night for > 1 yr.  Denies urinary symptoms such as dysuria, urgency, flank pain, hematuria or n/v, fever, chills, except has some freq during the day as well, though still drinks plenty of fluids he admits. No change in stream hesitancy or retention he is aware. .Has appt with urology this yr, just not sure of date and MD.    Selina Cooley has bilat shoulder pain and left wrist pain to d/w Dr Smith/[facility]  Past Medical History:  Diagnosis Date  . Balanitis    hx of  . Cataract   . Glaucoma   . Hx of adenomatous polyp of colon 03/09/2014  . Hyperlipidemia   . Hypertension   . Osteoarthritis    diffuse  . Stroke [facility])    pt states ? when had   Past Surgical History:  Procedure Laterality Date  . CATARACT EXTRACTION    .  COLONOSCOPY  06-05-2003   hems only  . TONSILLECTOMY      reports that he quit smoking about 38 years ago. His smoking use included Cigarettes. He has a 20.00 pack-year smoking history. He has never used smokeless tobacco. He reports that he does not drink alcohol or use drugs. family history includes Cancer in his father; Diabetes in his brother; Other (age of onset: 7) in his mother. Allergies  Allergen Reactions  . Gemfibrozil     REACTION: unspecified  . Lipitor [Atorvastatin]     Uneasy feeling  . Niacin     REACTION: unspecified  . Penicillins     REACTION: unspecified  . Statins Other (See Comments)    myalgias  . Valacyclovir Hcl     REACTION: unspecified   Current Outpatient Prescriptions on File Prior to Visit  Medication Sig Dispense Refill  . Clobetasol Propionate (CLOBEX SPRAY) 0.05 % external spray Apply topically as needed.      Marland Kitchen FLUZONE HIGH-DOSE 0.5 ML SUSY   0  . lisinopril (PRINIVIL,ZESTRIL) 20 MG tablet take 1 tablet by mouth once daily 90 tablet 3  . multivitamin (THERAGRAN) tablet Take 1 tablet by mouth daily.      . traMADol (ULTRAM) 50 MG tablet Take 1 tablet (50 mg total) by mouth every 6 (six) hours as needed. 60 tablet 1   No current facility-administered medications on file prior to  visit.    Review of Systems Constitutional: Negative for increased diaphoresis, or other activity, appetite or siginficant weight change other than noted HENT: Negative for worsening hearing loss, ear pain, facial swelling, mouth sores and neck stiffness.   Eyes: Negative for other worsening pain, redness or visual disturbance.  Respiratory: Negative for choking or stridor Cardiovascular: Negative for other chest pain and palpitations.  Gastrointestinal: Negative for worsening diarrhea, blood in stool, or abdominal distention Genitourinary: Negative for hematuria, flank pain or change in urine volume.  Musculoskeletal: Negative for myalgias or other joint complaints.    Skin: Negative for other color change and wound or drainage.  Neurological: Negative for syncope and numbness. other than noted Hematological: Negative for adenopathy. or other swelling Psychiatric/Behavioral: Negative for hallucinations, SI, self-injury, decreased concentration or other worsening agitation.      Objective:   Physical Exam BP 138/80   Pulse 65   Temp 97.8 F (36.6 C) (Oral)   Resp 20   Wt 182 lb (82.6 kg)   SpO2 97%   BMI 24.68 kg/m  VS noted,  Constitutional: Pt is oriented to person, place, and time. Appears well-developed and well-nourished, in no significant distress Head: Normocephalic and atraumatic  Eyes: Conjunctivae and EOM are normal. Pupils are equal, round, and reactive to light Right Ear: External ear normal.  Left Ear: External ear normal Nose: Nose normal.  Mouth/Throat: Oropharynx is clear and moist  Neck: Normal range of motion. Neck supple. No JVD present. No tracheal deviation present or significant neck LA or mass Cardiovascular: Normal rate, regular rhythm, normal heart sounds and intact distal pulses.   Pulmonary/Chest: Effort normal and breath sounds without rales or wheezing  Abdominal: Soft. Bowel sounds are normal. NT. No HSM  Musculoskeletal: Normal range of motion. Exhibits no edema Lymphadenopathy: Has no cervical adenopathy.  Neurological: Pt is alert and oriented to person, place, and time. Pt has normal reflexes. No cranial nerve deficit. Motor grossly intact Skin: Skin is warm and dry. No rash noted or new ulcers Psychiatric:  Has normal mood and affect. Behavior is normal.     Assessment & Plan:

## 2015-12-10 NOTE — Patient Instructions (Addendum)
You had the flu shot today  Please make appointment with Dr Tamala Julian for the feet and shoulder pains  Please continue all other medications as before, and refills have been done if requested.  Please have the pharmacy call with any other refills you may need.  Please continue your efforts at being more active, low cholesterol diet, and weight control.  You are otherwise up to date with prevention measures today.  Please keep your appointments with your specialists as you may have planned  Please go to the LAB in the Basement (turn left off the elevator) for the tests to be done today  You will be contacted by phone if any changes need to be made immediately.  Otherwise, you will receive a letter about your results with an explanation, but please check with MyChart first.  Please remember to sign up for MyChart if you have not done so, as this will be important to you in the future with finding out test results, communicating by private email, and scheduling acute appointments online when needed.  Please return in 1 year for your yearly visit, or sooner if needed, with Lab testing done 3-5 days before

## 2015-12-10 NOTE — Progress Notes (Signed)
Pre visit review using our clinic review tool, if applicable. No additional management support is needed unless otherwise documented below in the visit note. 

## 2015-12-10 NOTE — Telephone Encounter (Signed)
Generic crestor is done erx  Please also ask pt to start ASA 81 mg - 1 per day, to also help reduce risk of stroke and heart disease

## 2015-12-19 ENCOUNTER — Ambulatory Visit: Payer: Medicare Other | Admitting: Family Medicine

## 2015-12-20 ENCOUNTER — Other Ambulatory Visit: Payer: Self-pay | Admitting: Internal Medicine

## 2015-12-20 NOTE — Telephone Encounter (Signed)
Done hardcopy to Corinne  

## 2015-12-20 NOTE — Telephone Encounter (Signed)
Rx faxed to pharmacy  

## 2015-12-26 DIAGNOSIS — H401133 Primary open-angle glaucoma, bilateral, severe stage: Secondary | ICD-10-CM | POA: Diagnosis not present

## 2016-01-12 ENCOUNTER — Other Ambulatory Visit: Payer: Self-pay | Admitting: Internal Medicine

## 2016-01-15 ENCOUNTER — Other Ambulatory Visit: Payer: Self-pay | Admitting: Internal Medicine

## 2016-01-28 DIAGNOSIS — Z23 Encounter for immunization: Secondary | ICD-10-CM | POA: Diagnosis not present

## 2016-01-28 DIAGNOSIS — L409 Psoriasis, unspecified: Secondary | ICD-10-CM | POA: Diagnosis not present

## 2016-02-04 DIAGNOSIS — N401 Enlarged prostate with lower urinary tract symptoms: Secondary | ICD-10-CM | POA: Diagnosis not present

## 2016-02-11 DIAGNOSIS — R350 Frequency of micturition: Secondary | ICD-10-CM | POA: Diagnosis not present

## 2016-02-11 DIAGNOSIS — R972 Elevated prostate specific antigen [PSA]: Secondary | ICD-10-CM | POA: Diagnosis not present

## 2016-02-11 DIAGNOSIS — N401 Enlarged prostate with lower urinary tract symptoms: Secondary | ICD-10-CM | POA: Diagnosis not present

## 2016-06-25 DIAGNOSIS — H18413 Arcus senilis, bilateral: Secondary | ICD-10-CM | POA: Diagnosis not present

## 2016-06-25 DIAGNOSIS — H401131 Primary open-angle glaucoma, bilateral, mild stage: Secondary | ICD-10-CM | POA: Diagnosis not present

## 2016-06-25 DIAGNOSIS — Z961 Presence of intraocular lens: Secondary | ICD-10-CM | POA: Diagnosis not present

## 2016-07-30 DIAGNOSIS — L409 Psoriasis, unspecified: Secondary | ICD-10-CM | POA: Diagnosis not present

## 2016-07-30 DIAGNOSIS — L821 Other seborrheic keratosis: Secondary | ICD-10-CM | POA: Diagnosis not present

## 2016-10-15 DIAGNOSIS — H401131 Primary open-angle glaucoma, bilateral, mild stage: Secondary | ICD-10-CM | POA: Diagnosis not present

## 2016-12-10 DIAGNOSIS — Z961 Presence of intraocular lens: Secondary | ICD-10-CM | POA: Diagnosis not present

## 2016-12-28 ENCOUNTER — Other Ambulatory Visit: Payer: Self-pay | Admitting: Internal Medicine

## 2016-12-31 DIAGNOSIS — H401131 Primary open-angle glaucoma, bilateral, mild stage: Secondary | ICD-10-CM | POA: Diagnosis not present

## 2017-01-11 NOTE — Progress Notes (Signed)
Pre visit review using our clinic review tool, if applicable. No additional management support is needed unless otherwise documented below in the visit note. 

## 2017-01-11 NOTE — Progress Notes (Signed)
Subjective:   [name] Good is a 76 y.o. male who presents for Medicare Annual/Subsequent preventive examination.  Review of Systems:  No ROS.  Medicare Wellness Visit. Additional risk factors are reflected in the social history.  Cardiac Risk Factors include: advanced age (>3men, >42 women);hypertension;dyslipidemia;male gender Sleep patterns: feels rested on waking, gets up 2-3 times nightly to void and sleeps 7-8 hours nightly.    Home Safety/Smoke Alarms: Feels safe in home. Smoke alarms in place.  Living environment; residence and Firearm Safety: 1-story house/ trailer, no firearms. Lives with wife, no needs for DME, good support system Seat Belt Safety/Bike Helmet: Wears seat belt.   Male:   CCS- Last 03/05/14    PSA-  Lab Results  Component Value Date   PSA 3.19 01/12/2017   PSA 4.06 (H) 12/10/2015   PSA 3.80 12/05/2014       Objective:    Vitals: BP (!) 142/82   Pulse 74   Temp 98 F (36.7 C)   Resp 20   Ht 6' (1.829 m)   Wt 184 lb (83.5 kg)   SpO2 100%   BMI 24.95 kg/m   Body mass index is 24.95 kg/m.  Tobacco History  Smoking Status  . Former Smoker  . Packs/day: 1.00  . Years: 20.00  . Types: Cigarettes  . Quit date: 04/20/1977  Smokeless Tobacco  . Never Used     Counseling given: Not Answered   Past Medical History:  Diagnosis Date  . Balanitis    hx of  . Cataract   . Glaucoma   . Hx of adenomatous polyp of colon 03/09/2014  . Hyperlipidemia   . Hypertension   . Osteoarthritis    diffuse  . Stroke [facility])    pt states ? when had   Past Surgical History:  Procedure Laterality Date  . CATARACT EXTRACTION    . COLONOSCOPY  06-05-2003   hems only  . TONSILLECTOMY     Family History  Problem Relation Age of Onset  . Other Mother 34       CVA  . Cancer Father        lung  . Diabetes Brother   . Coronary artery disease Neg Hx   . Colon cancer Neg Hx    History  Sexual Activity  . Sexual activity: Yes  . Partners:  Female    Outpatient Encounter Prescriptions as of 01/12/2017  Medication Sig  . aspirin EC 81 MG tablet Take 1 tablet (81 mg total) by mouth daily.  . Clobetasol Propionate (CLOBEX SPRAY) 0.05 % external spray Apply topically as needed.    Marland Kitchen lisinopril (PRINIVIL,ZESTRIL) 20 MG tablet take 1 tablet by mouth once daily  . Multiple Vitamins-Minerals (PRESERVISION AREDS PO) Take 1 drop by mouth 2 (two) times daily.  . multivitamin (THERAGRAN) tablet Take 1 tablet by mouth daily.    Marland Kitchen FLUZONE HIGH-DOSE 0.5 ML SUSY   . [DISCONTINUED] lisinopril (PRINIVIL,ZESTRIL) 20 MG tablet take 1 tablet by mouth once daily  . [DISCONTINUED] lisinopril (PRINIVIL,ZESTRIL) 20 MG tablet take 1 tablet by mouth once daily  . [DISCONTINUED] rosuvastatin (CRESTOR) 20 MG tablet Take 1 tablet (20 mg total) by mouth daily. (Patient not taking: Reported on 01/12/2017)  . [DISCONTINUED] traMADol (ULTRAM) 50 MG tablet take 1 tablet by mouth every 6 hours if needed (Patient not taking: Reported on 01/12/2017)   No facility-administered encounter medications on file as of 01/12/2017.     Activities of Daily Living In your present  state of health, do you have any difficulty performing the following activities: 01/12/2017  Hearing? N  Vision? Y  Difficulty concentrating or making decisions? N  Walking or climbing stairs? N  Dressing or bathing? N  Doing errands, shopping? N  Preparing Food and eating ? N  Using the Toilet? N  In the past six months, have you accidently leaked urine? Y  Comment frequent urination  Do you have problems with loss of bowel control? N  Managing your Medications? N  Managing your Finances? N  Housekeeping or managing your Housekeeping? N  Some recent data might be hidden    Patient Care Team: [name], MD as PCP - General (Internal Medicine) Marylynn Pearson, MD (Ophthalmology) Oliver Barre, MD as Referring Physician (Ophthalmology)   Assessment:    Physical assessment deferred to  PCP.  Exercise Activities and Dietary recommendations Current Exercise Habits: Structured exercise class, Type of exercise: calisthenics;strength training/weights;walking, Time (Minutes): 55, Frequency (Times/Week): 5, Weekly Exercise (Minutes/Week): 275, Intensity: Mild, Exercise limited by: Other - see comments (eye sight)  Diet (meal preparation, eat out, water intake, caffeinated beverages, dairy products, fruits and vegetables): in general, a "healthy" diet  , well balanced, eats a variety of fruits and vegetables daily, limits salt, fat/cholesterol, sugar, caffeine, drinks 6-8 glasses of water daily.    Goals      Patient Stated   . exercise (pt-stated)          Try to exercise x 2 per day; will assist with HDL (50; goal 60 )         Other   . Maintain my current health status          Continue to exercise daily, enjoy life, family and worship God      Fall Risk Fall Risk  01/12/2017 12/10/2015 12/05/2014 11/28/2014 11/29/2013  Falls in the past year? No No No No No  Risk for fall due to : Impaired vision - - - -   Depression Screen PHQ 2/9 Scores 01/12/2017 12/10/2015 12/05/2014 11/28/2014  PHQ - 2 Score 1 0 0 0  PHQ- 9 Score 2 - - -    Cognitive Function MMSE - Mini Mental State Exam 01/12/2017 11/28/2014  Not completed: Unable to complete Unable to complete       Ad8 score reviewed for issues:  Issues making decisions: no  Less interest in hobbies / activities: no  Repeats questions, stories (family complaining): no  Trouble using ordinary gadgets (microwave, computer, phone):no  Forgets the month or year: no  Mismanaging finances: no  Remembering appts: no  Daily problems with thinking and/or memory: no Ad8 score is= 0  Immunization History  Administered Date(s) Administered  . Influenza Split 03/03/2011, 02/09/2012  . Influenza Whole 04/06/2007, 01/31/2008, 02/26/2009, 03/21/2010  . Influenza, High Dose Seasonal PF 02/14/2013, 01/09/2014  .  Influenza,inj,Quad PF,6+ Mos 12/10/2015  . Influenza-Unspecified 01/05/2015  . Pneumococcal Conjugate-13 11/29/2013  . Pneumococcal Polysaccharide-23 09/02/2009, 03/03/2011  . Td 12/19/1997, 02/26/2009  . Zoster 11/28/2014  . Zoster Recombinat (Shingrix) 08/28/2016   Screening Tests Health Maintenance  Topic Date Due  . INFLUENZA VACCINE  11/18/2016  . TETANUS/TDAP  02/27/2019  . PNA vac Low Risk Adult  Completed      Plan:    Continue doing brain stimulating activities (puzzles, reading, adult coloring books, staying active) to keep memory sharp.   Continue to eat heart healthy diet (full of fruits, vegetables, whole grains, lean protein, water--limit salt, fat, and sugar intake)  and increase physical activity as tolerated.  I have personally reviewed and noted the following in the patient's chart:   . Medical and social history . Use of alcohol, tobacco or illicit drugs  . Current medications and supplements . Functional ability and status . Nutritional status . Physical activity . Advanced directives . List of other physicians . Vitals . Screenings to include cognitive, depression, and falls . Referrals and appointments  In addition, I have reviewed and discussed with patient certain preventive protocols, quality metrics, and best practice recommendations. A written personalized care plan for preventive services as well as general preventive health recommendations were provided to patient.     Michiel Cowboy, RN  01/12/2017  Medical screening examination/treatment/procedure(s) were performed by non-physician practitioner and as supervising physician I was immediately available for consultation/collaboration. I agree with above. Cathlean Cower, MD

## 2017-01-12 ENCOUNTER — Ambulatory Visit (INDEPENDENT_AMBULATORY_CARE_PROVIDER_SITE_OTHER): Payer: Medicare Other | Admitting: Internal Medicine

## 2017-01-12 ENCOUNTER — Other Ambulatory Visit (INDEPENDENT_AMBULATORY_CARE_PROVIDER_SITE_OTHER): Payer: Medicare Other

## 2017-01-12 ENCOUNTER — Encounter: Payer: Self-pay | Admitting: Internal Medicine

## 2017-01-12 VITALS — BP 142/82 | HR 74 | Temp 98.0°F | Resp 20 | Ht 72.0 in | Wt 184.0 lb

## 2017-01-12 DIAGNOSIS — M25512 Pain in left shoulder: Secondary | ICD-10-CM | POA: Insufficient documentation

## 2017-01-12 DIAGNOSIS — R972 Elevated prostate specific antigen [PSA]: Secondary | ICD-10-CM

## 2017-01-12 DIAGNOSIS — E785 Hyperlipidemia, unspecified: Secondary | ICD-10-CM

## 2017-01-12 DIAGNOSIS — I10 Essential (primary) hypertension: Secondary | ICD-10-CM

## 2017-01-12 DIAGNOSIS — Z23 Encounter for immunization: Secondary | ICD-10-CM

## 2017-01-12 DIAGNOSIS — Z0000 Encounter for general adult medical examination without abnormal findings: Secondary | ICD-10-CM | POA: Diagnosis not present

## 2017-01-12 LAB — BASIC METABOLIC PANEL
BUN: 15 mg/dL (ref 6–23)
CO2: 33 mEq/L — ABNORMAL HIGH (ref 19–32)
Calcium: 9.8 mg/dL (ref 8.4–10.5)
Chloride: 102 mEq/L (ref 96–112)
Creatinine, Ser: 0.95 mg/dL (ref 0.40–1.50)
GFR: 98.9 mL/min (ref >60.00)
Glucose, Bld: 98 mg/dL (ref 70–99)
POTASSIUM: 4.3 meq/L (ref 3.5–5.1)
SODIUM: 140 meq/L (ref 135–145)

## 2017-01-12 LAB — CBC WITH DIFFERENTIAL/PLATELET
Basophils Absolute: 0.1 K/uL (ref 0.0–0.1)
Basophils Relative: 1.3 % (ref 0.0–3.0)
Eosinophils Absolute: 0.1 K/uL (ref 0.0–0.7)
Eosinophils Relative: 2.8 % (ref 0.0–5.0)
HCT: 41.7 % (ref 39.0–52.0)
Hemoglobin: 13.7 g/dL (ref 13.0–17.0)
Lymphocytes Relative: 34.1 % (ref 12.0–46.0)
Lymphs Abs: 1.5 K/uL (ref 0.7–4.0)
MCHC: 32.9 g/dL (ref 30.0–36.0)
MCV: 88 fl (ref 78.0–100.0)
Monocytes Absolute: 0.6 K/uL (ref 0.1–1.0)
Monocytes Relative: 13 % — ABNORMAL HIGH (ref 3.0–12.0)
Neutro Abs: 2.1 K/uL (ref 1.4–7.7)
Neutrophils Relative %: 48.8 % (ref 43.0–77.0)
Platelets: 236 K/uL (ref 150.0–400.0)
RBC: 4.74 Mil/uL (ref 4.22–5.81)
RDW: 12.7 % (ref 11.5–15.5)
WBC: 4.3 K/uL (ref 4.0–10.5)

## 2017-01-12 LAB — HEPATIC FUNCTION PANEL
ALBUMIN: 4.8 g/dL (ref 3.5–5.2)
ALK PHOS: 64 U/L (ref 39–117)
ALT: 17 U/L (ref 0–53)
AST: 18 U/L (ref 0–37)
BILIRUBIN TOTAL: 0.4 mg/dL (ref 0.2–1.2)
Bilirubin, Direct: 0.1 mg/dL (ref 0.0–0.3)
Total Protein: 7.2 g/dL (ref 6.0–8.3)

## 2017-01-12 LAB — LIPID PANEL
Cholesterol: 240 mg/dL — ABNORMAL HIGH (ref 0–200)
HDL: 62.2 mg/dL
NonHDL: 177.36
Total CHOL/HDL Ratio: 4
Triglycerides: 221 mg/dL — ABNORMAL HIGH (ref 0.0–149.0)
VLDL: 44.2 mg/dL — ABNORMAL HIGH (ref 0.0–40.0)

## 2017-01-12 LAB — URINALYSIS, ROUTINE W REFLEX MICROSCOPIC
Bilirubin Urine: NEGATIVE
Hgb urine dipstick: NEGATIVE
Ketones, ur: NEGATIVE
Leukocytes, UA: NEGATIVE
Nitrite: NEGATIVE
PH: 7 (ref 5.0–8.0)
RBC / HPF: NONE SEEN (ref 0–?)
Specific Gravity, Urine: <=1.005 — AB (ref 1.000–1.030)
TOTAL PROTEIN, URINE-UPE24: NEGATIVE
Urine Glucose: NEGATIVE
Urobilinogen, UA: 0.2 (ref 0.0–1.0)

## 2017-01-12 LAB — PSA: PSA: 3.19 ng/mL (ref 0.10–4.00)

## 2017-01-12 LAB — TSH: TSH: 0.85 u[IU]/mL (ref 0.35–4.50)

## 2017-01-12 LAB — LDL CHOLESTEROL, DIRECT: Direct LDL: 123 mg/dL

## 2017-01-12 NOTE — Progress Notes (Signed)
Subjective:    Patient ID: [name] Good, male    DOB: Jul 28, 1940, 76 y.o.   MRN: [id number]  HPI  Here for yearly f/u;  Overall doing ok;  Pt denies Chest pain, worsening SOB, DOE, wheezing, orthopnea, PND, worsening LE edema, palpitations, dizziness or syncope.  Pt denies neurological change such as new headache, facial or extremity weakness.  Pt denies polydipsia, polyuria, or low sugar symptoms. Pt states overall good compliance with treatment and medications, good tolerability, and has been trying to follow appropriate diet.  Pt denies worsening depressive symptoms, suicidal ideation or panic. No fever, night sweats, wt loss, loss of appetite, or other constitutional symptoms.  Pt states good ability with ADL's, has low fall risk, home safety reviewed and adequate, no other significant changes in hearing or vision, and active with exercise, with gym 4 times per wk.  Did not take statin, wanting to cont diet.   Wt Readings from Last 3 Encounters:  01/12/17 184 lb (83.5 kg)  12/10/15 182 lb (82.6 kg)  08/07/15 184 lb (83.5 kg)   BP Readings from Last 3 Encounters:  01/12/17 (!) 142/82  12/10/15 138/80  08/07/15 138/82  Also c/o pain to left upper arm only between shgoulder and elbow, can do some overhead arm lifting and seems to work it out (and shower) then nearly back to normal.  No longer taking the tramadol Past Medical History:  Diagnosis Date  . Balanitis    hx of  . Cataract   . Glaucoma   . Hx of adenomatous polyp of colon 03/09/2014  . Hyperlipidemia   . Hypertension   . Osteoarthritis    diffuse  . Stroke [facility])    pt states ? when had   Past Surgical History:  Procedure Laterality Date  . CATARACT EXTRACTION    . COLONOSCOPY  06-05-2003   hems only  . TONSILLECTOMY      reports that he quit smoking about 39 years ago. His smoking use included Cigarettes. He has a 20.00 pack-year smoking history. He has never used smokeless tobacco. He reports that he does not  drink alcohol or use drugs. family history includes Cancer in his father; Diabetes in his brother; Other (age of onset: 22) in his mother. Allergies  Allergen Reactions  . Gemfibrozil     REACTION: unspecified  . Lipitor [Atorvastatin]     Uneasy feeling  . Niacin     REACTION: unspecified  . Penicillins     REACTION: unspecified  . Statins Other (See Comments)    myalgias  . Valacyclovir Hcl     REACTION: unspecified   Current Outpatient Prescriptions on File Prior to Visit  Medication Sig Dispense Refill  . aspirin EC 81 MG tablet Take 1 tablet (81 mg total) by mouth daily. 90 tablet 11  . Clobetasol Propionate (CLOBEX SPRAY) 0.05 % external spray Apply topically as needed.      Marland Kitchen lisinopril (PRINIVIL,ZESTRIL) 20 MG tablet take 1 tablet by mouth once daily 90 tablet 3  . multivitamin (THERAGRAN) tablet Take 1 tablet by mouth daily.      Marland Kitchen FLUZONE HIGH-DOSE 0.5 ML SUSY   0   No current facility-administered medications on file prior to visit.    Review of Systems  Constitutional: Negative for other unusual diaphoresis or sweats HENT: Negative for ear discharge or swelling Eyes: Negative for other worsening visual disturbances Respiratory: Negative for stridor or other swelling  Gastrointestinal: Negative for worsening distension or other blood Genitourinary:  Negative for retention or other urinary change Musculoskeletal: Negative for other MSK pain or swelling Skin: Negative for color change or other new lesions Neurological: Negative for worsening tremors and other numbness  Psychiatric/Behavioral: Negative for worsening agitation or other fatigue All other system neg per pt    Objective:   Physical Exam BP (!) 142/82   Pulse 74   Temp 98 F (36.7 C)   Resp 20   Ht 6' (1.829 m)   Wt 184 lb (83.5 kg)   SpO2 100%   BMI 24.95 kg/m  VS noted,  Constitutional: Pt appears in NAD HENT: Head: NCAT.  Right Ear: External ear normal.  Left Ear: External ear normal.    Eyes: . Pupils are equal, round, and reactive to light. Conjunctivae and EOM are normal Nose: without d/c or deformity Neck: Neck supple. Gross normal ROM Cardiovascular: Normal rate and regular rhythm.   Pulmonary/Chest: Effort normal and breath sounds without rales or wheezing.  Abd:  Soft, NT, ND, + BS, no organomegaly Left shoulder with inability to forward elevated or abduct > 90 degrees Neurological: Pt is alert. At baseline orientation, motor grossly intact Skin: Skin is warm. No rashes, other new lesions, no LE edema Psychiatric: Pt behavior is normal without agitation  No other exam findings      Assessment & Plan:

## 2017-01-12 NOTE — Assessment & Plan Note (Signed)
Asympt, likely BPH related, for f/u PSA today per pt request

## 2017-01-12 NOTE — Patient Instructions (Addendum)
Please continue all other medications as before, and refills have been done if requested.  Please have the pharmacy call with any other refills you may need.  Please continue your efforts at being more active, low cholesterol diet, and weight control.  You are otherwise up to date with prevention measures today.  Please keep your appointments with your specialists as you may have planned  Please make an appointment with Dr Tamala Julian regarding the left shoulder pain  Please go to the LAB in the Basement (turn left off the elevator) for the tests to be done today  You will be contacted by phone if any changes need to be made immediately.  Otherwise, you will receive a letter about your results with an explanation, but please check with MyChart first.  Please remember to sign up for MyChart if you have not done so, as this will be important to you in the future with finding out test results, communicating by private email, and scheduling acute appointments online when needed  Continue doing brain stimulating activities (puzzles, reading, adult coloring books, staying active) to keep memory sharp.   Continue to eat heart healthy diet (full of fruits, vegetables, whole grains, lean protein, water--limit salt, fat, and sugar intake) and increase physical activity as tolerated.   Mr. Goulette , Thank you for taking time to come for your Medicare Wellness Visit. I appreciate your ongoing commitment to your health goals. Please review the following plan we discussed and let me know if I can assist you in the future.   These are the goals we discussed: Goals      Patient Stated   . exercise (pt-stated)          Try to exercise x 2 per day; will assist with HDL (50; goal 60 )         Other   . Maintain my current health status          Continue to exercise daily, enjoy life, family and worship God       This is a list of the screening recommended for you and due dates:  Health  Maintenance  Topic Date Due  . Flu Shot  11/18/2016  . Tetanus Vaccine  02/27/2019  . Pneumonia vaccines  Completed   Influenza Virus Vaccine injection What is this medicine? INFLUENZA VIRUS VACCINE (in floo EN zuh VAHY ruhs vak SEEN) helps to reduce the risk of getting influenza also known as the flu. The vaccine only helps protect you against some strains of the flu. This medicine may be used for other purposes; ask your health care provider or pharmacist if you have questions. COMMON BRAND NAME(S): Afluria, Agriflu, Alfuria, FLUAD, Fluarix, Fluarix Quadrivalent, Flublok, Flublok Quadrivalent, FLUCELVAX, Flulaval, Fluvirin, Fluzone, Fluzone High-Dose, Fluzone Intradermal What should I tell my health care provider before I take this medicine? They need to know if you have any of these conditions: -bleeding disorder like hemophilia -fever or infection -Guillain-Barre syndrome or other neurological problems -immune system problems -infection with the human immunodeficiency virus (HIV) or AIDS -low blood platelet counts -multiple sclerosis -an unusual or allergic reaction to influenza virus vaccine, latex, other medicines, foods, dyes, or preservatives. Different brands of vaccines contain different allergens. Some may contain latex or eggs. Talk to your doctor about your allergies to make sure that you get the right vaccine. -pregnant or trying to get pregnant -breast-feeding How should I use this medicine? This vaccine is for injection into a muscle or under the  skin. It is given by a health care professional. A copy of Vaccine Information Statements will be given before each vaccination. Read this sheet carefully each time. The sheet may change frequently. Talk to your healthcare provider to see which vaccines are right for you. Some vaccines should not be used in all age groups. Overdosage: If you think you have taken too much of this medicine contact a poison control center or emergency  room at once. NOTE: This medicine is only for you. Do not share this medicine with others. What if I miss a dose? This does not apply. What may interact with this medicine? -chemotherapy or radiation therapy -medicines that lower your immune system like etanercept, anakinra, infliximab, and adalimumab -medicines that treat or prevent blood clots like warfarin -phenytoin -steroid medicines like prednisone or cortisone -theophylline -vaccines This list may not describe all possible interactions. Give your health care provider a list of all the medicines, herbs, non-prescription drugs, or dietary supplements you use. Also tell them if you smoke, drink alcohol, or use illegal drugs. Some items may interact with your medicine. What should I watch for while using this medicine? Report any side effects that do not go away within 3 days to your doctor or health care professional. Call your health care provider if any unusual symptoms occur within 6 weeks of receiving this vaccine. You may still catch the flu, but the illness is not usually as bad. You cannot get the flu from the vaccine. The vaccine will not protect against colds or other illnesses that may cause fever. The vaccine is needed every year. What side effects may I notice from receiving this medicine? Side effects that you should report to your doctor or health care professional as soon as possible: -allergic reactions like skin rash, itching or hives, swelling of the face, lips, or tongue Side effects that usually do not require medical attention (report to your doctor or health care professional if they continue or are bothersome): -fever -headache -muscle aches and pains -pain, tenderness, redness, or swelling at the injection site -tiredness This list may not describe all possible side effects. Call your doctor for medical advice about side effects. You may report side effects to FDA at 1-800-FDA-1088. Where should I keep my  medicine? The vaccine will be given by a health care professional in a clinic, pharmacy, doctor's office, or other health care setting. You will not be given vaccine doses to store at home. NOTE: This sheet is a summary. It may not cover all possible information. If you have questions about this medicine, talk to your doctor, pharmacist, or health care provider.  2018 Elsevier/Gold Standard (2014-10-26 10:07:28)

## 2017-01-12 NOTE — Assessment & Plan Note (Signed)
Most likely c/w rot cuff dz - for sport medicine appt, pt states will make appt as he leaves

## 2017-01-12 NOTE — Assessment & Plan Note (Signed)
stable overall by history and exam, recent data reviewed with pt, and pt to continue medical treatment as before,  to f/u any worsening symptoms or concerns  

## 2017-01-28 ENCOUNTER — Ambulatory Visit: Payer: Medicare Other | Admitting: Family Medicine

## 2017-02-03 NOTE — Progress Notes (Signed)
[facility] [place], [place] [address] Phone: [phone] Subjective:    I'm seeing this patient by the request  of:    CC: Shoulder pain. Left-sided  BJY:[id number]  [name] Good is a 76 y.o. male coming in with complaint of left shoulder pain. Past medical history significant for polymyalgia rheumatica. Patient states that he has been having pain for 1 year. His pain is intermittent. He has pain with sleeping and in the morning notices some stiffness. His pain is in the anterior portion of the upper arm from the shoulder to the elbow. Denies any radiating pain or numbness.   Onset- 1 year Location- left shoulder Duration- intermittent Character- dull Aggravating factors- Reliving factors-  Therapies tried- ice, heat, muscle rub Severity-     Past Medical History:  Diagnosis Date  . Balanitis    hx of  . Cataract   . Glaucoma   . Hx of adenomatous polyp of colon 03/09/2014  . Hyperlipidemia   . Hypertension   . Osteoarthritis    diffuse  . Stroke [facility])    pt states ? when had   Past Surgical History:  Procedure Laterality Date  . CATARACT EXTRACTION    . COLONOSCOPY  06-05-2003   hems only  . TONSILLECTOMY     Social History   Social History  . Marital status: Married    Spouse name: N/A  . Number of children: 0  . Years of education: 15   Occupational History  . [employer] [place] History Main Topics  . Smoking status: Former Smoker    Packs/day: 1.00    Years: 20.00    Types: Cigarettes    Quit date: 04/20/1977  . Smokeless tobacco: Never Used  . Alcohol use No  . Drug use: No  . Sexual activity: Yes    Partners: Female   Other Topics Concern  . None   Social History Narrative   HS[employer], [employer], [place]. Married '62.    No children. 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 End of Life Care - provided packet (July '12)      Minister; Still pastor       Allergies  Allergen Reactions  . Gemfibrozil     REACTION: unspecified  . Lipitor [Atorvastatin]     Uneasy feeling  . Niacin     REACTION: unspecified  . Penicillins     REACTION: unspecified  . Statins Other (See Comments)    myalgias  . Valacyclovir Hcl     REACTION: unspecified   Family History  Problem Relation Age of Onset  . Other Mother 39       CVA  . Cancer Father        lung  . Diabetes Brother   . Coronary artery disease Neg Hx   . Colon cancer Neg Hx      Past medical history, social, surgical and family history all reviewed in electronic medical record.  No pertanent information unless stated regarding to the chief complaint.   Review of Systems:Review of systems updated and as accurate as of 02/04/17  No headache, visual changes, nausea, vomiting, diarrhea, constipation, dizziness, abdominal pain, skin rash, fevers, chills, night sweats, weight loss, swollen lymph nodes, body aches, joint swelling, chest pain, shortness of breath, mood changes. Positive muscle aches  Objective  Blood pressure 132/82, pulse 68, height 6' (1.829 m), weight 185 lb (83.9 kg), SpO2 96 %.  Systems examined below as of 02/04/17   General: No apparent distress alert and oriented x3 mood and affect normal, dressed appropriately.  HEENT: Pupils equal, extraocular movements intact  Respiratory: Patient's speak in full sentences and does not appear short of breath  Cardiovascular: No lower extremity edema, non tender, no erythema  Skin: Warm dry intact with no signs of infection or rash on extremities or on axial skeleton.  Abdomen: Soft nontender  Neuro: Cranial nerves II through XII are intact, neurovascularly intact in all extremities with 2+ DTRs and 2+ pulses.  Lymph: No lymphadenopathy of posterior or anterior cervical chain or axillae bilaterally.  Gait normal with good balance and coordination.  MSK:  Non tender with full range of motion and good stability and symmetric  strength and tone of elbows, wrist, hip, knee and ankles bilaterally.  Shoulder: left Inspection reveals nAtrophy noted Palpation is normal with no tenderness over AC joint or bicipital groove. ROM is full in all planes passively. Rotator cuff strength normal throughout. Mild crepitus with range of motion signs of impingement with positive Neer and Hawkin's tests, but negative empty can sign. Speeds and Yergason's tests are minorly positive. No labral pathology noted with negative Obrien's, negative clunk and good stability. Normal scapular function observed. No painful arc and no drop arm sign. No apprehension sign  Neck: Inspection loss of lordosis. No palpable stepoffs. Negative Spurling's maneuver. Significant decreased range of motion. Patient has minimal sidebending to the left as well as 5 of rotation to left. Crepitus noted. Only has 10 of extension. Grip strength and sensation normal in bilateral hands Strength good C4 to T1 distribution No sensory change to C4 to T1 Negative Hoffman sign bilaterally Reflexes normal  [id number]; 15 additional minutes spent for Therapeutic exercises as stated in above notes.  This included exercises focusing on stretching, strengthening, with significant focus on eccentric aspects.   Long term goals include an improvement in range of motion, strength, endurance as well as avoiding reinjury. Patient's frequency would include in 1-2 times a day, 3-5 times a week for a duration of 6-12 weeks.  Shoulder Exercises that included:  Basic scapular stabilization to include adduction and depression of scapula Scaption, focusing on proper movement and good control Internal and External rotation utilizing a theraband, with elbow tucked at side entire time Rows with theraband which was given  Proper technique shown and discussed handout in great detail with ATC.  All questions were discussed and answered.      Impression and Recommendations:     This case  required medical decision making of moderate complexity.      Note: This dictation was prepared with Dragon dictation along with smaller phrase technology. Any transcriptional errors that result from this process are unintentional.

## 2017-02-04 ENCOUNTER — Ambulatory Visit (INDEPENDENT_AMBULATORY_CARE_PROVIDER_SITE_OTHER): Payer: Medicare Other | Admitting: Family Medicine

## 2017-02-04 ENCOUNTER — Encounter: Payer: Self-pay | Admitting: Family Medicine

## 2017-02-04 DIAGNOSIS — M4802 Spinal stenosis, cervical region: Secondary | ICD-10-CM

## 2017-02-04 DIAGNOSIS — M25512 Pain in left shoulder: Secondary | ICD-10-CM

## 2017-02-04 MED ORDER — GABAPENTIN 100 MG PO CAPS: 200.0000 mg | ORAL_CAPSULE | Freq: Every day | ORAL | 3 refills | Status: DC

## 2017-02-04 NOTE — Assessment & Plan Note (Signed)
Patient's left shoulder pain is likely some subacromial bursitis with some underlying arthritis. Discussed with patient at great length and patient once a trying conservative therapy including topical anti-inflammatories, icing regimen. Work with Product/process development scientist to learn home exercises in greater detail. We discussed which active is a doing which ones to avoid. Patient will continue with this. Follow-up in 3-4 weeks and if worsening symptoms we'll consider formal physical therapy as well as injections.

## 2017-02-04 NOTE — Patient Instructions (Signed)
Good to see you  Ice 20 minutes 2 times daily. Usually after activity and before bed. Exercises 3 times a week.  pennsaid pinkie amount topically 2 times daily as needed.   Keep hands within peripheral vision  Gabapentin 200mg  nightly See me again in 3-4 weeks.

## 2017-02-04 NOTE — Assessment & Plan Note (Signed)
I believe the patient also has some underlying degenerative cervical changes. Likely contribute to some of the discomfort in the decreased range of motion of the neck. Started on a low dose of gabapentin medical be beneficial. We discussed icing regimen. Follow-up again 34 weeks

## 2017-03-04 ENCOUNTER — Ambulatory Visit: Payer: Medicare Other | Admitting: Family Medicine

## 2017-03-25 ENCOUNTER — Other Ambulatory Visit: Payer: Self-pay

## 2017-04-03 ENCOUNTER — Other Ambulatory Visit: Payer: Self-pay | Admitting: Internal Medicine

## 2017-05-19 ENCOUNTER — Encounter (HOSPITAL_COMMUNITY): Payer: Self-pay

## 2017-05-19 ENCOUNTER — Other Ambulatory Visit: Payer: Self-pay

## 2017-05-19 DIAGNOSIS — I10 Essential (primary) hypertension: Secondary | ICD-10-CM | POA: Diagnosis not present

## 2017-05-19 DIAGNOSIS — Z79891 Long term (current) use of opiate analgesic: Secondary | ICD-10-CM | POA: Diagnosis not present

## 2017-05-19 DIAGNOSIS — Y929 Unspecified place or not applicable: Secondary | ICD-10-CM | POA: Insufficient documentation

## 2017-05-19 DIAGNOSIS — S0083XA Contusion of other part of head, initial encounter: Secondary | ICD-10-CM | POA: Diagnosis not present

## 2017-05-19 DIAGNOSIS — Y999 Unspecified external cause status: Secondary | ICD-10-CM | POA: Insufficient documentation

## 2017-05-19 DIAGNOSIS — Z041 Encounter for examination and observation following transport accident: Secondary | ICD-10-CM | POA: Diagnosis present

## 2017-05-19 DIAGNOSIS — M545 Low back pain: Secondary | ICD-10-CM | POA: Insufficient documentation

## 2017-05-19 DIAGNOSIS — R51 Headache: Secondary | ICD-10-CM | POA: Insufficient documentation

## 2017-05-19 DIAGNOSIS — S199XXA Unspecified injury of neck, initial encounter: Secondary | ICD-10-CM | POA: Diagnosis not present

## 2017-05-19 DIAGNOSIS — R079 Chest pain, unspecified: Secondary | ICD-10-CM | POA: Diagnosis not present

## 2017-05-19 DIAGNOSIS — Z7982 Long term (current) use of aspirin: Secondary | ICD-10-CM | POA: Insufficient documentation

## 2017-05-19 DIAGNOSIS — Y939 Activity, unspecified: Secondary | ICD-10-CM | POA: Insufficient documentation

## 2017-05-19 DIAGNOSIS — M542 Cervicalgia: Secondary | ICD-10-CM | POA: Insufficient documentation

## 2017-05-19 DIAGNOSIS — M549 Dorsalgia, unspecified: Secondary | ICD-10-CM | POA: Diagnosis not present

## 2017-05-19 DIAGNOSIS — S20219A Contusion of unspecified front wall of thorax, initial encounter: Secondary | ICD-10-CM | POA: Diagnosis not present

## 2017-05-19 DIAGNOSIS — Z79899 Other long term (current) drug therapy: Secondary | ICD-10-CM | POA: Diagnosis not present

## 2017-05-19 DIAGNOSIS — S0990XA Unspecified injury of head, initial encounter: Secondary | ICD-10-CM | POA: Diagnosis not present

## 2017-05-19 DIAGNOSIS — S3992XA Unspecified injury of lower back, initial encounter: Secondary | ICD-10-CM | POA: Diagnosis not present
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
## 2017-05-20 ENCOUNTER — Emergency Department (HOSPITAL_COMMUNITY): Payer: Medicare Other

## 2017-05-20 ENCOUNTER — Emergency Department (HOSPITAL_COMMUNITY)
Admission: EM | Admit: 2017-05-20 | Discharge: 2017-05-20 | Disposition: A | Discharge status: Home / Self Care | Payer: Medicare Other | Attending: Emergency Medicine | Admitting: Emergency Medicine

## 2017-05-20 DIAGNOSIS — M549 Dorsalgia, unspecified: Secondary | ICD-10-CM | POA: Diagnosis not present

## 2017-05-20 DIAGNOSIS — M545 Low back pain, unspecified: Secondary | ICD-10-CM

## 2017-05-20 DIAGNOSIS — S0083XA Contusion of other part of head, initial encounter: Secondary | ICD-10-CM

## 2017-05-20 DIAGNOSIS — M542 Cervicalgia: Secondary | ICD-10-CM

## 2017-05-20 DIAGNOSIS — S20219A Contusion of unspecified front wall of thorax, initial encounter: Secondary | ICD-10-CM

## 2017-05-20 DIAGNOSIS — R079 Chest pain, unspecified: Secondary | ICD-10-CM | POA: Diagnosis not present

## 2017-05-20 DIAGNOSIS — S199XXA Unspecified injury of neck, initial encounter: Secondary | ICD-10-CM | POA: Diagnosis not present

## 2017-05-20 DIAGNOSIS — S3992XA Unspecified injury of lower back, initial encounter: Secondary | ICD-10-CM | POA: Diagnosis not present

## 2017-05-20 DIAGNOSIS — S0990XA Unspecified injury of head, initial encounter: Secondary | ICD-10-CM | POA: Diagnosis not present

## 2017-05-20 LAB — I-STAT TROPONIN, ED: Troponin i, poc: 0.05 ng/mL (ref 0.00–0.08)

## 2017-05-20 MED ORDER — IBUPROFEN 200 MG PO TABS
400.0000 mg | ORAL_TABLET | Freq: Once | ORAL | Status: AC
  Administered 2017-05-20: 400 mg via ORAL
  Filled 2017-05-20: qty 2

## 2017-05-20 MED ORDER — METHOCARBAMOL 500 MG PO TABS
500.0000 mg | ORAL_TABLET | Freq: Once | ORAL | Status: AC
  Administered 2017-05-20: 500 mg via ORAL
  Filled 2017-05-20: qty 1

## 2017-05-20 MED ORDER — LISINOPRIL 20 MG PO TABS
20.0000 mg | ORAL_TABLET | Freq: Once | ORAL | Status: AC
  Administered 2017-05-20: 20 mg via ORAL
  Filled 2017-05-20: qty 1

## 2017-05-20 MED ORDER — METHOCARBAMOL 500 MG PO TABS: 500.0000 mg | ORAL_TABLET | Freq: Two times a day (BID) | ORAL | 0 refills | Status: DC | PRN
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
## 2017-05-20 NOTE — Discharge Instructions (Signed)
Ice packs to the injured or sore muscles for the next several days then start using heat. Take the medications for muscle spasms and take ibuprofen 400 mg 3 times a day.  Return to the ED for any problems listed on the head injury sheet. Recheck if you aren't improving in the next week, or if you get worse such as shortness of breath.

## 2017-07-02 DIAGNOSIS — H401133 Primary open-angle glaucoma, bilateral, severe stage: Secondary | ICD-10-CM | POA: Diagnosis not present

## 2017-07-09 ENCOUNTER — Other Ambulatory Visit (INDEPENDENT_AMBULATORY_CARE_PROVIDER_SITE_OTHER): Payer: Medicare Other

## 2017-07-09 ENCOUNTER — Encounter: Payer: Self-pay | Admitting: Family

## 2017-07-09 ENCOUNTER — Ambulatory Visit (INDEPENDENT_AMBULATORY_CARE_PROVIDER_SITE_OTHER): Payer: Medicare Other | Admitting: Family

## 2017-07-09 VITALS — BP 126/82 | HR 84 | Temp 98.5°F | Ht 72.0 in | Wt 180.0 lb

## 2017-07-09 DIAGNOSIS — R351 Nocturia: Secondary | ICD-10-CM | POA: Diagnosis not present

## 2017-07-09 DIAGNOSIS — R110 Nausea: Secondary | ICD-10-CM

## 2017-07-09 DIAGNOSIS — R251 Tremor, unspecified: Secondary | ICD-10-CM

## 2017-07-09 LAB — CBC WITH DIFFERENTIAL/PLATELET
BASOS ABS: 0 10*3/uL (ref 0.0–0.1)
Basophils Relative: 0.4 % (ref 0.0–3.0)
Eosinophils Absolute: 0 10*3/uL (ref 0.0–0.7)
Eosinophils Relative: 0.6 % (ref 0.0–5.0)
HEMATOCRIT: 39.8 % (ref 39.0–52.0)
HEMOGLOBIN: 13.4 g/dL (ref 13.0–17.0)
LYMPHS PCT: 20.6 % (ref 12.0–46.0)
Lymphs Abs: 0.9 10*3/uL (ref 0.7–4.0)
MCHC: 33.7 g/dL (ref 30.0–36.0)
MCV: 87 fl (ref 78.0–100.0)
MONOS PCT: 12.4 % — AB (ref 3.0–12.0)
Monocytes Absolute: 0.6 10*3/uL (ref 0.1–1.0)
NEUTROS ABS: 3 10*3/uL (ref 1.4–7.7)
Neutrophils Relative %: 66 % (ref 43.0–77.0)
PLATELETS: 230 10*3/uL (ref 150.0–400.0)
RBC: 4.57 Mil/uL (ref 4.22–5.81)
RDW: 12.5 % (ref 11.5–15.5)
WBC: 4.6 10*3/uL (ref 4.0–10.5)

## 2017-07-09 LAB — COMPREHENSIVE METABOLIC PANEL
ALT: 17 U/L (ref 0–53)
AST: 18 U/L (ref 0–37)
Albumin: 4.4 g/dL (ref 3.5–5.2)
Alkaline Phosphatase: 65 U/L (ref 39–117)
BILIRUBIN TOTAL: 0.4 mg/dL (ref 0.2–1.2)
BUN: 18 mg/dL (ref 6–23)
CALCIUM: 9.5 mg/dL (ref 8.4–10.5)
CO2: 28 meq/L (ref 19–32)
Chloride: 103 mEq/L (ref 96–112)
Creatinine, Ser: 0.96 mg/dL (ref 0.40–1.50)
GFR: 97.59 mL/min (ref >60.00)
Glucose, Bld: 105 mg/dL — ABNORMAL HIGH (ref 70–99)
Potassium: 4.2 mEq/L (ref 3.5–5.1)
Sodium: 140 mEq/L (ref 135–145)
Total Protein: 7.3 g/dL (ref 6.0–8.3)

## 2017-07-09 MED ORDER — PANTOPRAZOLE SODIUM 40 MG PO TBEC: 40.0000 mg | DELAYED_RELEASE_TABLET | Freq: Every day | ORAL | 3 refills | Status: DC
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
## 2017-07-12 ENCOUNTER — Ambulatory Visit: Payer: Medicare Other | Admitting: Internal Medicine

## 2017-07-23 ENCOUNTER — Ambulatory Visit
Admission: RE | Admit: 2017-07-23 | Discharge: 2017-07-23 | Disposition: A | Discharge status: Home / Self Care | Payer: Medicare Other | Source: Ambulatory Visit | Attending: Family | Admitting: Family

## 2017-07-23 DIAGNOSIS — R110 Nausea: Secondary | ICD-10-CM

## 2017-07-30 DIAGNOSIS — H401133 Primary open-angle glaucoma, bilateral, severe stage: Secondary | ICD-10-CM | POA: Diagnosis not present

## 2017-08-16 ENCOUNTER — Ambulatory Visit (INDEPENDENT_AMBULATORY_CARE_PROVIDER_SITE_OTHER): Payer: Medicare Other | Admitting: Internal Medicine

## 2017-08-16 ENCOUNTER — Encounter: Payer: Self-pay | Admitting: Internal Medicine

## 2017-08-16 VITALS — BP 124/86 | HR 87 | Temp 97.8°F | Ht 72.0 in | Wt 179.0 lb

## 2017-08-16 DIAGNOSIS — E785 Hyperlipidemia, unspecified: Secondary | ICD-10-CM

## 2017-08-16 DIAGNOSIS — R011 Cardiac murmur, unspecified: Secondary | ICD-10-CM | POA: Diagnosis not present

## 2017-08-16 DIAGNOSIS — I10 Essential (primary) hypertension: Secondary | ICD-10-CM | POA: Diagnosis not present

## 2017-08-16 NOTE — Assessment & Plan Note (Signed)
?   Evolving as -for echo,  to f/u any worsening symptoms or concerns, pt declines repeat ecg

## 2017-08-16 NOTE — Assessment & Plan Note (Signed)
Lab Results  Component Value Date   LDLCALC 151 (H) 12/10/2015  for lower chol diet, f/u lab with goal ldl < 100

## 2017-08-16 NOTE — Assessment & Plan Note (Signed)
stable overall by history and exam, recent data reviewed with pt, and pt to continue medical treatment as before,  to f/u any worsening symptoms or concerns BP Readings from Last 3 Encounters:  08/16/17 124/86  07/09/17 126/82  05/20/17 (!) 176/99

## 2017-08-16 NOTE — Patient Instructions (Signed)
Please continue all other medications as before, and refills have been done if requested.  Please have the pharmacy call with any other refills you may need.  Please continue your efforts at being more active, low cholesterol diet, and weight control.  You are otherwise up to date with prevention measures today.  Please keep your appointments with your specialists as you may have planned  You will be contacted regarding the referral for: Echocardiogram  Please return in 6 months, or sooner if needed

## 2017-08-16 NOTE — Progress Notes (Signed)
Subjective:    Patient ID: [name] Good, male    DOB: May 21, 1940, 77 y.o.   MRN: [id number]  HPI  Here to f/u; overall doing ok,  Pt denies chest pain, increasing sob or doe, wheezing, orthopnea, PND, increased LE swelling, palpitations, dizziness or syncope.  Pt denies new neurological symptoms such as new headache, or facial or extremity weakness or numbness.  Pt denies polydipsia, polyuria, or low sugar episode.  Pt states overall good compliance with meds, mostly trying to follow appropriate diet, with wt overall stable,  but little exercise however. BP Readings from Last 3 Encounters:  08/16/17 124/86  07/09/17 126/82  05/20/17 (!) 176/99  Gained several lbs with ice cream, plans to do better.   Wt Readings from Last 3 Encounters:  08/16/17 179 lb (81.2 kg)  07/09/17 180 lb 0.6 oz (81.7 kg)  05/19/17 175 lb (79.4 kg)  See optho every 6 mo for glaucoma. Past Medical History:  Diagnosis Date  . Balanitis    hx of  . Cataract   . Glaucoma   . Hx of adenomatous polyp of colon 03/09/2014  . Hyperlipidemia   . Hypertension   . Osteoarthritis    diffuse  . Stroke [facility])    pt states ? when had   Past Surgical History:  Procedure Laterality Date  . CATARACT EXTRACTION    . COLONOSCOPY  06-05-2003   hems only  . TONSILLECTOMY      reports that he quit smoking about 40 years ago. His smoking use included cigarettes. He has a 20.00 pack-year smoking history. He has never used smokeless tobacco. He reports that he does not drink alcohol or use drugs. family history includes Cancer in his father; Diabetes in his brother; Other (age of onset: 51) in his mother. Allergies  Allergen Reactions  . Gemfibrozil     REACTION: unspecified  . Lipitor [Atorvastatin]     Uneasy feeling  . Niacin     REACTION: unspecified  . Penicillins     REACTION: unspecified  . Statins Other (See Comments)    myalgias  . Valacyclovir Hcl     REACTION: unspecified   Current Outpatient  Medications on File Prior to Visit  Medication Sig Dispense Refill  . aspirin EC 81 MG tablet Take 1 tablet (81 mg total) by mouth daily. 90 tablet 11  . clobetasol ointment (TEMOVATE) 0.05 %   0  . latanoprost (XALATAN) 0.005 % ophthalmic solution   0  . lisinopril (PRINIVIL,ZESTRIL) 20 MG tablet take 1 tablet by mouth once daily 90 tablet 3  . multivitamin (THERAGRAN) tablet Take 1 tablet by mouth daily.      . pantoprazole (PROTONIX) 40 MG tablet Take 1 tablet (40 mg total) by mouth daily. 30 tablet 3   No current facility-administered medications on file prior to visit.    Review of Systems  Constitutional: Negative for other unusual diaphoresis or sweats HENT: Negative for ear discharge or swelling Eyes: Negative for other worsening visual disturbances Respiratory: Negative for stridor or other swelling  Gastrointestinal: Negative for worsening distension or other blood Genitourinary: Negative for retention or other urinary change Musculoskeletal: Negative for other MSK pain or swelling Skin: Negative for color change or other new lesions Neurological: Negative for worsening tremors and other numbness  Psychiatric/Behavioral: Negative for worsening agitation or other fatigue All other system neg per pt    Objective:   Physical Exam BP 124/86   Pulse 87   Temp 97.8 F (  36.6 C) (Oral)   Ht 6' (1.829 m)   Wt 179 lb (81.2 kg)   SpO2 99%   BMI 24.28 kg/m  VS noted,  Constitutional: Pt appears in NAD HENT: Head: NCAT.  Right Ear: External ear normal.  Left Ear: External ear normal.  Eyes: . Pupils are equal, round, and reactive to light. Conjunctivae and EOM are normal Nose: without d/c or deformity Neck: Neck supple. Gross normal ROM Cardiovascular: Normal rate and regular rhythm.  with gr 2-3/6 syst murmur RUSB Pulmonary/Chest: Effort normal and breath sounds without rales or wheezing.  Abd:  Soft, NT, ND, + BS, no organomegaly Neurological: Pt is alert. At baseline  orientation, motor grossly intact Skin: Skin is warm. No rashes, other new lesions, no LE edema Psychiatric: Pt behavior is normal without agitation  No other exam findings Lab Results  Component Value Date   WBC 4.6 07/09/2017   HGB 13.4 07/09/2017   HCT 39.8 07/09/2017   PLT 230.0 07/09/2017   GLUCOSE 105 (H) 07/09/2017   CHOL 240 (H) 01/12/2017   TRIG 221.0 (H) 01/12/2017   HDL 62.20 01/12/2017   LDLDIRECT 123.0 01/12/2017   LDLCALC 151 (H) 12/10/2015   ALT 17 07/09/2017   AST 18 07/09/2017   NA 140 07/09/2017   K 4.2 07/09/2017   CL 103 07/09/2017   CREATININE 0.96 07/09/2017   BUN 18 07/09/2017   CO2 28 07/09/2017   TSH 0.85 01/12/2017   PSA 3.19 01/12/2017      Assessment & Plan:

## 2017-08-27 ENCOUNTER — Encounter: Payer: Self-pay | Admitting: Internal Medicine

## 2017-08-27 ENCOUNTER — Ambulatory Visit (HOSPITAL_COMMUNITY): Payer: Medicare Other | Attending: Cardiology

## 2017-08-27 ENCOUNTER — Other Ambulatory Visit: Payer: Self-pay

## 2017-08-27 DIAGNOSIS — R011 Cardiac murmur, unspecified: Secondary | ICD-10-CM | POA: Insufficient documentation

## 2017-08-27 DIAGNOSIS — E785 Hyperlipidemia, unspecified: Secondary | ICD-10-CM | POA: Insufficient documentation

## 2017-08-27 DIAGNOSIS — I10 Essential (primary) hypertension: Secondary | ICD-10-CM | POA: Insufficient documentation

## 2017-08-27 DIAGNOSIS — Z87891 Personal history of nicotine dependence: Secondary | ICD-10-CM | POA: Insufficient documentation

## 2017-09-01 ENCOUNTER — Other Ambulatory Visit: Payer: Self-pay | Admitting: Family

## 2017-12-17 ENCOUNTER — Other Ambulatory Visit: Payer: Self-pay | Admitting: Family

## 2018-01-27 ENCOUNTER — Ambulatory Visit (INDEPENDENT_AMBULATORY_CARE_PROVIDER_SITE_OTHER): Payer: Medicare Other | Admitting: *Deleted

## 2018-01-27 DIAGNOSIS — Z23 Encounter for immunization: Secondary | ICD-10-CM

## 2018-01-28 ENCOUNTER — Ambulatory Visit: Payer: Medicare Other

## 2018-02-22 ENCOUNTER — Ambulatory Visit (INDEPENDENT_AMBULATORY_CARE_PROVIDER_SITE_OTHER): Payer: Medicare Other | Admitting: Internal Medicine

## 2018-02-22 ENCOUNTER — Other Ambulatory Visit (INDEPENDENT_AMBULATORY_CARE_PROVIDER_SITE_OTHER): Payer: Medicare Other

## 2018-02-22 VITALS — BP 114/76 | HR 92 | Temp 97.6°F | Ht 72.0 in | Wt 181.0 lb

## 2018-02-22 DIAGNOSIS — R739 Hyperglycemia, unspecified: Secondary | ICD-10-CM

## 2018-02-22 DIAGNOSIS — E785 Hyperlipidemia, unspecified: Secondary | ICD-10-CM

## 2018-02-22 DIAGNOSIS — R972 Elevated prostate specific antigen [PSA]: Secondary | ICD-10-CM | POA: Diagnosis not present

## 2018-02-22 DIAGNOSIS — I10 Essential (primary) hypertension: Secondary | ICD-10-CM

## 2018-02-22 LAB — PSA: PSA: 3.24 ng/mL (ref 0.10–4.00)

## 2018-02-22 LAB — BASIC METABOLIC PANEL
BUN: 16 mg/dL (ref 6–23)
CHLORIDE: 103 meq/L (ref 96–112)
CO2: 31 mEq/L (ref 19–32)
CREATININE: 1.07 mg/dL (ref 0.40–1.50)
Calcium: 9.5 mg/dL (ref 8.4–10.5)
GFR: 85.96 mL/min (ref >60.00)
GLUCOSE: 100 mg/dL — AB (ref 70–99)
POTASSIUM: 4.3 meq/L (ref 3.5–5.1)
SODIUM: 140 meq/L (ref 135–145)

## 2018-02-22 LAB — URINALYSIS, ROUTINE W REFLEX MICROSCOPIC
Bilirubin Urine: NEGATIVE
Ketones, ur: NEGATIVE
Leukocytes, UA: NEGATIVE
NITRITE: NEGATIVE
PH: 6 (ref 5.0–8.0)
Specific Gravity, Urine: <=1.005 — AB (ref 1.000–1.030)
TOTAL PROTEIN, URINE-UPE24: NEGATIVE
URINE GLUCOSE: NEGATIVE
Urobilinogen, UA: 0.2 (ref 0.0–1.0)

## 2018-02-22 LAB — CBC WITH DIFFERENTIAL/PLATELET
BASOS PCT: 1 % (ref 0.0–3.0)
Basophils Absolute: 0 10*3/uL (ref 0.0–0.1)
Eosinophils Absolute: 0.1 10*3/uL (ref 0.0–0.7)
Eosinophils Relative: 2 % (ref 0.0–5.0)
HCT: 39.6 % (ref 39.0–52.0)
HEMOGLOBIN: 13.4 g/dL (ref 13.0–17.0)
LYMPHS ABS: 1.1 10*3/uL (ref 0.7–4.0)
Lymphocytes Relative: 30.1 % (ref 12.0–46.0)
MCHC: 33.8 g/dL (ref 30.0–36.0)
MCV: 87.1 fl (ref 78.0–100.0)
MONO ABS: 0.5 10*3/uL (ref 0.1–1.0)
Monocytes Relative: 13.6 % — ABNORMAL HIGH (ref 3.0–12.0)
NEUTROS PCT: 53.3 % (ref 43.0–77.0)
Neutro Abs: 2 10*3/uL (ref 1.4–7.7)
Platelets: 226 10*3/uL (ref 150.0–400.0)
RBC: 4.55 Mil/uL (ref 4.22–5.81)
RDW: 12.3 % (ref 11.5–15.5)
WBC: 3.7 10*3/uL — AB (ref 4.0–10.5)

## 2018-02-22 LAB — LIPID PANEL
CHOLESTEROL: 233 mg/dL — AB (ref 0–200)
HDL: 62.8 mg/dL (ref >39.00)
LDL CALC: 139 mg/dL — AB (ref 0–99)
NONHDL: 170.23
Total CHOL/HDL Ratio: 4
Triglycerides: 158 mg/dL — ABNORMAL HIGH (ref 0.0–149.0)
VLDL: 31.6 mg/dL (ref 0.0–40.0)

## 2018-02-22 LAB — HEPATIC FUNCTION PANEL
ALT: 20 U/L (ref 0–53)
AST: 18 U/L (ref 0–37)
Albumin: 4.6 g/dL (ref 3.5–5.2)
Alkaline Phosphatase: 68 U/L (ref 39–117)
BILIRUBIN DIRECT: 0.1 mg/dL (ref 0.0–0.3)
BILIRUBIN TOTAL: 0.5 mg/dL (ref 0.2–1.2)
Total Protein: 7.2 g/dL (ref 6.0–8.3)

## 2018-02-22 LAB — TSH: TSH: 0.75 u[IU]/mL (ref 0.35–4.50)

## 2018-02-22 LAB — HEMOGLOBIN A1C: HEMOGLOBIN A1C: 5.8 % (ref 4.6–6.5)

## 2018-02-22 NOTE — Patient Instructions (Signed)

## 2018-02-22 NOTE — Progress Notes (Signed)
Subjective:    Patient ID: [name] Good, male    DOB: 1940-12-15, 77 y.o.   MRN: [id number]  HPI  Here to f/u; overall doing ok,  Pt denies chest pain, increasing sob or doe, wheezing, orthopnea, PND, increased LE swelling, palpitations, dizziness or syncope.  Pt denies new neurological symptoms such as new headache, or facial or extremity weakness or numbness.  Pt denies polydipsia, polyuria, or low sugar episode.  Pt states overall good compliance with meds, mostly trying to follow appropriate diet, with wt overall stable,  but little exercise however.  Denies urinary symptoms such as dysuria, frequency, urgency, flank pain, hematuria or n/v, fever, chills. Wt Readings from Last 3 Encounters:  02/22/18 181 lb (82.1 kg)  08/16/17 179 lb (81.2 kg)  07/09/17 180 lb 0.6 oz (81.7 kg)   Past Medical History:  Diagnosis Date  . Balanitis    hx of  . Cataract   . Glaucoma   . Hx of adenomatous polyp of colon 03/09/2014  . Hyperlipidemia   . Hypertension   . Osteoarthritis    diffuse  . Stroke [facility])    pt states ? when had   Past Surgical History:  Procedure Laterality Date  . CATARACT EXTRACTION    . COLONOSCOPY  06-05-2003   hems only  . TONSILLECTOMY      reports that he quit smoking about 40 years ago. His smoking use included cigarettes. He has a 20.00 pack-year smoking history. He has never used smokeless tobacco. He reports that he does not drink alcohol or use drugs. family history includes Cancer in his father; Diabetes in his brother; Other (age of onset: 7) in his mother. Allergies  Allergen Reactions  . Gemfibrozil     REACTION: unspecified  . Lipitor [Atorvastatin]     Uneasy feeling  . Niacin     REACTION: unspecified  . Penicillins     REACTION: unspecified  . Statins Other (See Comments)    myalgias  . Valacyclovir Hcl     REACTION: unspecified   Current Outpatient Medications on File Prior to Visit  Medication Sig Dispense Refill  . aspirin EC 81  MG tablet Take 1 tablet (81 mg total) by mouth daily. 90 tablet 11  . clobetasol ointment (TEMOVATE) 0.05 %   0  . latanoprost (XALATAN) 0.005 % ophthalmic solution   0  . lisinopril (PRINIVIL,ZESTRIL) 20 MG tablet take 1 tablet by mouth once daily 90 tablet 3  . multivitamin (THERAGRAN) tablet Take 1 tablet by mouth daily.      . pantoprazole (PROTONIX) 40 MG tablet TAKE 1 TABLET(40 MG) BY MOUTH DAILY 90 tablet 0   No current facility-administered medications on file prior to visit.    Review of Systems  Constitutional: Negative for other unusual diaphoresis or sweats HENT: Negative for ear discharge or swelling Eyes: Negative for other worsening visual disturbances Respiratory: Negative for stridor or other swelling  Gastrointestinal: Negative for worsening distension or other blood Genitourinary: Negative for retention or other urinary change Musculoskeletal: Negative for other MSK pain or swelling Skin: Negative for color change or other new lesions Neurological: Negative for worsening tremors and other numbness  Psychiatric/Behavioral: Negative for worsening agitation or other fatigue All other system neg per pt    Objective:   Physical Exam BP 114/76   Pulse 92   Temp 97.6 F (36.4 C) (Oral)   Ht 6' (1.829 m)   Wt 181 lb (82.1 kg)   SpO2 97%  BMI 24.55 kg/m  VS noted,  Constitutional: Pt appears in NAD HENT: Head: NCAT.  Right Ear: External ear normal.  Left Ear: External ear normal.  Eyes: . Pupils are equal, round, and reactive to light. Conjunctivae and EOM are normal Nose: without d/c or deformity Neck: Neck supple. Gross normal ROM Cardiovascular: Normal rate and regular rhythm.   Pulmonary/Chest: Effort normal and breath sounds without rales or wheezing.  Abd:  Soft, NT, ND, + BS, no organomegaly Neurological: Pt is alert. At baseline orientation, motor grossly intact Skin: Skin is warm. No rashes, other new lesions, no LE edema Psychiatric: Pt behavior is  normal without agitation  No other exam findings  Lab Results  Component Value Date   WBC 4.6 07/09/2017   HGB 13.4 07/09/2017   HCT 39.8 07/09/2017   PLT 230.0 07/09/2017   GLUCOSE 105 (H) 07/09/2017   CHOL 240 (H) 01/12/2017   TRIG 221.0 (H) 01/12/2017   HDL 62.20 01/12/2017   LDLDIRECT 123.0 01/12/2017   LDLCALC 151 (H) 12/10/2015   ALT 17 07/09/2017   AST 18 07/09/2017   NA 140 07/09/2017   K 4.2 07/09/2017   CL 103 07/09/2017   CREATININE 0.96 07/09/2017   BUN 18 07/09/2017   CO2 28 07/09/2017   TSH 0.85 01/12/2017   PSA 3.19 01/12/2017      Assessment & Plan:

## 2018-02-23 ENCOUNTER — Encounter: Payer: Self-pay | Admitting: Internal Medicine

## 2018-02-25 DIAGNOSIS — R739 Hyperglycemia, unspecified: Secondary | ICD-10-CM | POA: Insufficient documentation

## 2018-02-25 NOTE — Assessment & Plan Note (Signed)
stable overall by history and exam, recent data reviewed with pt, and pt to continue medical treatment as before,  to f/u any worsening symptoms or concerns  

## 2018-02-25 NOTE — Assessment & Plan Note (Signed)
Lab Results  Component Value Date   PSA 3.24 02/22/2018   PSA 3.19 01/12/2017   PSA 4.06 (H) 12/10/2015  stable overall by history and exam, recent data reviewed with pt, and pt to continue medical treatment as before,  to f/u any worsening symptoms or concerns

## 2018-03-20 ENCOUNTER — Other Ambulatory Visit: Payer: Self-pay | Admitting: Internal Medicine

## 2018-03-24 DIAGNOSIS — L409 Psoriasis, unspecified: Secondary | ICD-10-CM | POA: Diagnosis not present

## 2018-03-24 DIAGNOSIS — L821 Other seborrheic keratosis: Secondary | ICD-10-CM | POA: Diagnosis not present

## 2018-03-24 DIAGNOSIS — Z23 Encounter for immunization: Secondary | ICD-10-CM | POA: Diagnosis not present

## 2018-04-14 ENCOUNTER — Ambulatory Visit (INDEPENDENT_AMBULATORY_CARE_PROVIDER_SITE_OTHER): Payer: Medicare Other | Admitting: Family Medicine

## 2018-04-14 ENCOUNTER — Encounter: Payer: Self-pay | Admitting: Family Medicine

## 2018-04-14 VITALS — BP 148/92 | HR 77 | Temp 98.1°F | Ht 72.0 in | Wt 179.0 lb

## 2018-04-14 DIAGNOSIS — J209 Acute bronchitis, unspecified: Secondary | ICD-10-CM | POA: Diagnosis not present

## 2018-04-14 MED ORDER — BENZONATATE 100 MG PO CAPS: 100.0000 mg | ORAL_CAPSULE | Freq: Three times a day (TID) | ORAL | 0 refills | Status: DC

## 2018-04-14 MED ORDER — AZITHROMYCIN 250 MG PO TABS: ORAL_TABLET | ORAL | 0 refills | Status: DC

## 2018-04-14 NOTE — Patient Instructions (Signed)
It was a pleasure meeting you today!  I have sent in a prescription for your cough.  Please take antibiotic with food as directed.  Please drink plenty of water, get plenty of rest and follow up if no improvement or symptoms worsen or you develop a fever >100.    Acute Bronchitis, Adult Acute bronchitis is when air tubes (bronchi) in the lungs suddenly get swollen. The condition can make it hard to breathe. It can also cause these symptoms:  A cough.  Coughing up clear, yellow, or green mucus.  Wheezing.  Chest congestion.  Shortness of breath.  A fever.  Body aches.  Chills.  A sore throat. Follow these instructions at home:  Medicines  Take over-the-counter and prescription medicines only as told by your doctor.  If you were prescribed an antibiotic medicine, take it as told by your doctor. Do not stop taking the antibiotic even if you start to feel better. General instructions  Rest.  Drink enough fluids to keep your pee (urine) pale yellow.  Avoid smoking and secondhand smoke. If you smoke and you need help quitting, ask your doctor. Quitting will help your lungs heal faster.  Use an inhaler, cool mist vaporizer, or humidifier as told by your doctor.  Keep all follow-up visits as told by your doctor. This is important. How is this prevented? To lower your risk of getting this condition again:  Wash your hands often with soap and water. If you cannot use soap and water, use hand sanitizer.  Avoid contact with people who have cold symptoms.  Try not to touch your hands to your mouth, nose, or eyes.  Make sure to get the flu shot every year. Contact a doctor if:  Your symptoms do not get better in 2 weeks. Get help right away if:  You cough up blood.  You have chest pain.  You have very bad shortness of breath.  You become dehydrated.  You faint (pass out) or keep feeling like you are going to pass out.  You keep throwing up (vomiting).  You  have a very bad headache.  Your fever or chills gets worse. This information is not intended to replace advice given to you by your health care provider. Make sure you discuss any questions you have with your health care provider. Document Released: 09/23/2007 Document Revised: 11/18/2016 Document Reviewed: 09/25/2015 Elsevier Interactive Patient Education  2019 [employer].

## 2018-04-14 NOTE — Progress Notes (Signed)
Patient ID: [name], male   DOB: Sep 14, 1940, 77 y.o.   MRN: [id number]  PCP: [name], MD  Subjective:  APOLO CUTSHAW is a 77 y.o. year old very pleasant male patient who presents with  symptoms including nasal congestion, cough that is productive of yellow/green sputum, and post nasal drip - does not have wheeze as well -started: 2 weeks ago , symptoms are not improving and not worsening Cough is most noticeable at night and disturbs his sleep -previous treatments: acetaminophen for arthritis but no other medications. -sick contacts/travel/risks: denies flu exposure. No recent sick contact exposure  Former smoker with quit date 04/20/1977.   ROS-denies fever, NVD, tooth pain. Denies significant shortness of breath.   Pertinent Past Medical History-  Patient Active Problem List   Diagnosis Date Noted  . Hyperglycemia 02/25/2018  . Heart murmur 08/16/2017  . Degenerative cervical spinal stenosis 02/04/2017  . Left shoulder pain 01/12/2017  . Arthritis of foot, degenerative 07/10/2015  . Plantar fasciitis, bilateral 07/03/2015  . Hx of adenomatous polyp of colon 03/09/2014  . PSA elevation 11/29/2013  . Piriformis syndrome of left side 11/28/2013  . Primary localized osteoarthrosis, lower leg 11/28/2013  . Bladder neck obstruction 11/12/2013  . Hammertoe 11/09/2013  . Routine general medical examination at a health care facility 10/26/2010  . Polymyalgia rheumatica ([place]) 02/26/2009  . LACUNAR INFARCTION 11/30/2008  . UNSPECIFIED MYALGIA AND MYOSITIS 11/30/2008  . Unspecified visual loss 07/19/2008  . SKIN LESIONS, MULTIPLE 07/19/2008  . OSTEOARTHRITIS 07/08/2007  . Hyperlipidemia 05/16/2007  . Essential hypertension 05/16/2007    Medications- reviewed  Current Outpatient Medications  Medication Sig Dispense Refill  . aspirin EC 81 MG tablet Take 1 tablet (81 mg total) by mouth daily. 90 tablet 11  . clobetasol ointment (TEMOVATE) 0.05 %   0  .  latanoprost (XALATAN) 0.005 % ophthalmic solution   0  . lisinopril (PRINIVIL,ZESTRIL) 20 MG tablet TAKE 1 TABLET BY MOUTH ONCE DAILY 90 tablet 3  . multivitamin (THERAGRAN) tablet Take 1 tablet by mouth daily.      . pantoprazole (PROTONIX) 40 MG tablet TAKE 1 TABLET(40 MG) BY MOUTH DAILY 90 tablet 0   No current facility-administered medications for this visit.     Objective: BP (!) 160/90 (BP Location: Left Arm, Patient Position: Sitting, Cuff Size: Normal)   Pulse 77   Temp 98.1 F (36.7 C) (Oral)   Ht 6' (1.829 m)   Wt 179 lb (81.2 kg)   SpO2 97%   BMI 24.28 kg/m  Retake of BP:  148/92 Gen: NAD, resting comfortably HEENT: Turbinates mildly erythematous, TMs normal, pharynx mildly erythematous with postnasal drip present. No exudate or edema or sinus tenderness present. CV: RRR, systolic murmur present RUSB, no rubs or gallops Lungs: CTAB no crackles, wheeze, rhonchi  Ext: no edema Skin: warm, dry, no rash  Assessment/Plan: 1. Acute bronchitis, unspecified organism Patient's symptoms most consistent with bronchitis. Discussed likely viral in nature and less likely bacterial cause. Benign lung exam and stable vital signs make pneumonia unlikely. Discussed possible treatment options and he is not experiencing SOB so we opted not to use prednisone today. Provided him with benzonatate and also provided him with a prescription for azithromycin to take due to duration of 2 weeks. He can also use Mucinex for symptoms. Advised that if his symptoms were to worsen he should let us know. Return precautions provided.   - azithromycin (ZITHROMAX) 250 MG tablet; Take 2 tablets by mouth at  once today, then one tablet daily x 4 days.  Dispense: 6 tablet; Refill: 0 - benzonatate (TESSALON) 100 MG capsule; Take 1 capsule (100 mg total) by mouth 3 (three) times daily.  Dispense: 20 capsule; Refill: 0  We discussed that we did not find any infection that had higher probability of being bacterial  such as pneumonia, strep throat, ear infection, bacterial sinusitis. We discussed signs that bacterial infection may have developed particularly fever or shortness of breath and reasons for follow up (symptoms worsen, last past expected time frame, new concerns arise).   Laurita Quint, FNP

## 2018-06-08 ENCOUNTER — Ambulatory Visit (INDEPENDENT_AMBULATORY_CARE_PROVIDER_SITE_OTHER): Payer: Medicare HMO | Admitting: Internal Medicine

## 2018-06-08 ENCOUNTER — Encounter: Payer: Self-pay | Admitting: Internal Medicine

## 2018-06-08 VITALS — BP 122/84 | HR 92 | Temp 97.9°F | Ht 72.0 in | Wt 179.0 lb

## 2018-06-08 DIAGNOSIS — L609 Nail disorder, unspecified: Secondary | ICD-10-CM | POA: Insufficient documentation

## 2018-06-08 DIAGNOSIS — I10 Essential (primary) hypertension: Secondary | ICD-10-CM

## 2018-06-08 DIAGNOSIS — R739 Hyperglycemia, unspecified: Secondary | ICD-10-CM | POA: Diagnosis not present

## 2018-06-08 DIAGNOSIS — E785 Hyperlipidemia, unspecified: Secondary | ICD-10-CM | POA: Diagnosis not present

## 2018-06-08 NOTE — Assessment & Plan Note (Signed)
No s/s infection, ok for podiatry referral as has other nails need attention as well de to impaired vision

## 2018-06-08 NOTE — Assessment & Plan Note (Signed)
stable overall by history and exam, recent data reviewed with pt, and pt to continue medical treatment as before,  to f/u any worsening symptoms or concerns  

## 2018-06-08 NOTE — Assessment & Plan Note (Signed)
Lab Results  Component Value Date   LDLCALC 139 (H) 02/22/2018  d/w pt, delcines change in tx, for lower chol diet

## 2018-06-08 NOTE — Patient Instructions (Signed)
OK to put neosporin and gauze cover to the toe for 1 week or until healed  You will be contacted regarding the referral for: podiatry (foot doctor)  Please continue all other medications as before, and refills have been done if requested.  Please have the pharmacy call with any other refills you may need.  Please continue your efforts at being more active, low cholesterol diet, and weight control.  Please keep your appointments with your specialists as you may have planned

## 2018-06-08 NOTE — Progress Notes (Signed)
Subjective:    Patient ID: [name] Good, male    DOB: Jan 27, 1941, 78 y.o.   MRN: [id number]  HPI  Here to f/u; overall doing ok,  Pt denies chest pain, increasing sob or doe, wheezing, orthopnea, PND, increased LE swelling, palpitations, dizziness or syncope.  Pt denies new neurological symptoms such as new headache, or facial or extremity weakness or numbness.  Pt denies polydipsia, polyuria, or low sugar episode.  Pt states overall good compliance with meds, mostly trying to follow appropriate diet, with wt overall stable,  but little exercise however.  Also c/o second toenail left foot acciently torn off, concerned about infection, but denies fever, red, swelling or tender, just wanted it looked at as his vision is impaired.  Past Medical History:  Diagnosis Date  . Balanitis    hx of  . Cataract   . Glaucoma   . Hx of adenomatous polyp of colon 03/09/2014  . Hyperlipidemia   . Hypertension   . Osteoarthritis    diffuse  . Stroke [facility])    pt states ? when had   Past Surgical History:  Procedure Laterality Date  . CATARACT EXTRACTION    . COLONOSCOPY  06-05-2003   hems only  . TONSILLECTOMY      reports that he quit smoking about 41 years ago. His smoking use included cigarettes. He has a 20.00 pack-year smoking history. He has never used smokeless tobacco. He reports that he does not drink alcohol or use drugs. family history includes Cancer in his father; Diabetes in his brother; Other (age of onset: 56) in his mother. Allergies  Allergen Reactions  . Gemfibrozil     REACTION: unspecified  . Lipitor [Atorvastatin]     Uneasy feeling  . Niacin     REACTION: unspecified  . Penicillins     REACTION: unspecified  . Statins Other (See Comments)    myalgias  . Valacyclovir Hcl     REACTION: unspecified   Current Outpatient Medications on File Prior to Visit  Medication Sig Dispense Refill  . aspirin EC 81 MG tablet Take 1 tablet (81 mg total) by mouth daily. 90  tablet 11  . clobetasol ointment (TEMOVATE) 0.05 %   0  . latanoprost (XALATAN) 0.005 % ophthalmic solution   0  . lisinopril (PRINIVIL,ZESTRIL) 20 MG tablet TAKE 1 TABLET BY MOUTH ONCE DAILY 90 tablet 3  . multivitamin (THERAGRAN) tablet Take 1 tablet by mouth daily.      . pantoprazole (PROTONIX) 40 MG tablet TAKE 1 TABLET(40 MG) BY MOUTH DAILY 90 tablet 0   No current facility-administered medications on file prior to visit.    Review of Systems  Constitutional: Negative for other unusual diaphoresis or sweats HENT: Negative for ear discharge or swelling Eyes: Negative for other worsening visual disturbances Respiratory: Negative for stridor or other swelling  Gastrointestinal: Negative for worsening distension or other blood Genitourinary: Negative for retention or other urinary change Musculoskeletal: Negative for other MSK pain or swelling Skin: Negative for color change or other new lesions Neurological: Negative for worsening tremors and other numbness  Psychiatric/Behavioral: Negative for worsening agitation or other fatigue All other system neg per pt    Objective:   Physical Exam BP 122/84   Pulse 92   Temp 97.9 F (36.6 C) (Oral)   Ht 6' (1.829 m)   Wt 179 lb (81.2 kg)   SpO2 96%   BMI 24.28 kg/m  VS noted,  Constitutional: Pt appears in NAD  HENT: Head: NCAT.  Right Ear: External ear normal.  Left Ear: External ear normal.  Eyes: . Pupils are equal, round, and reactive to light. Conjunctivae and EOM are normal Nose: without d/c or deformity Neck: Neck supple. Gross normal ROM Cardiovascular: Normal rate and regular rhythm.   Pulmonary/Chest: Effort normal and breath sounds without rales or wheezing.  Abd:  Soft, NT, ND, + BS, no organomegaly Neurological: Pt is alert. At baseline orientation, motor grossly intact Skin: Skin is warm. No rashes, other new lesions, no LE edema, has longitudinally missing medial half of the nail on the left second toe, but no red,  swelling or tender Psychiatric: Pt behavior is normal without agitation  No other exam findings  Lab Results  Component Value Date   WBC 3.7 (L) 02/22/2018   HGB 13.4 02/22/2018   HCT 39.6 02/22/2018   PLT 226.0 02/22/2018   GLUCOSE 100 (H) 02/22/2018   CHOL 233 (H) 02/22/2018   TRIG 158.0 (H) 02/22/2018   HDL 62.80 02/22/2018   LDLDIRECT 123.0 01/12/2017   LDLCALC 139 (H) 02/22/2018   ALT 20 02/22/2018   AST 18 02/22/2018   NA 140 02/22/2018   K 4.3 02/22/2018   CL 103 02/22/2018   CREATININE 1.07 02/22/2018   BUN 16 02/22/2018   CO2 31 02/22/2018   TSH 0.75 02/22/2018   PSA 3.24 02/22/2018   HGBA1C 5.8 02/22/2018       Assessment & Plan:

## 2018-07-07 ENCOUNTER — Other Ambulatory Visit: Payer: Self-pay | Admitting: Internal Medicine

## 2018-08-25 DIAGNOSIS — H401133 Primary open-angle glaucoma, bilateral, severe stage: Secondary | ICD-10-CM | POA: Diagnosis not present

## 2018-08-25 DIAGNOSIS — H18413 Arcus senilis, bilateral: Secondary | ICD-10-CM | POA: Diagnosis not present

## 2018-08-25 DIAGNOSIS — Z961 Presence of intraocular lens: Secondary | ICD-10-CM | POA: Diagnosis not present

## 2018-09-28 ENCOUNTER — Other Ambulatory Visit: Payer: Self-pay | Admitting: Internal Medicine

## 2018-09-28 ENCOUNTER — Other Ambulatory Visit (INDEPENDENT_AMBULATORY_CARE_PROVIDER_SITE_OTHER): Payer: PRIVATE HEALTH INSURANCE

## 2018-09-28 ENCOUNTER — Encounter: Payer: Self-pay | Admitting: Internal Medicine

## 2018-09-28 ENCOUNTER — Ambulatory Visit (INDEPENDENT_AMBULATORY_CARE_PROVIDER_SITE_OTHER): Payer: Medicare HMO | Admitting: Internal Medicine

## 2018-09-28 ENCOUNTER — Other Ambulatory Visit: Payer: Self-pay

## 2018-09-28 VITALS — BP 124/82 | HR 81 | Temp 97.6°F | Ht 72.0 in | Wt 170.0 lb

## 2018-09-28 DIAGNOSIS — R739 Hyperglycemia, unspecified: Secondary | ICD-10-CM

## 2018-09-28 DIAGNOSIS — E538 Deficiency of other specified B group vitamins: Secondary | ICD-10-CM | POA: Diagnosis not present

## 2018-09-28 DIAGNOSIS — R945 Abnormal results of liver function studies: Secondary | ICD-10-CM

## 2018-09-28 DIAGNOSIS — Z0001 Encounter for general adult medical examination with abnormal findings: Secondary | ICD-10-CM

## 2018-09-28 DIAGNOSIS — D509 Iron deficiency anemia, unspecified: Secondary | ICD-10-CM

## 2018-09-28 DIAGNOSIS — R634 Abnormal weight loss: Secondary | ICD-10-CM | POA: Diagnosis not present

## 2018-09-28 DIAGNOSIS — E559 Vitamin D deficiency, unspecified: Secondary | ICD-10-CM

## 2018-09-28 DIAGNOSIS — H9193 Unspecified hearing loss, bilateral: Secondary | ICD-10-CM | POA: Diagnosis not present

## 2018-09-28 DIAGNOSIS — M353 Polymyalgia rheumatica: Secondary | ICD-10-CM | POA: Diagnosis not present

## 2018-09-28 DIAGNOSIS — R972 Elevated prostate specific antigen [PSA]: Secondary | ICD-10-CM | POA: Diagnosis not present

## 2018-09-28 DIAGNOSIS — R7989 Other specified abnormal findings of blood chemistry: Secondary | ICD-10-CM

## 2018-09-28 LAB — URINALYSIS, ROUTINE W REFLEX MICROSCOPIC
Hgb urine dipstick: NEGATIVE
Leukocytes,Ua: NEGATIVE
Nitrite: NEGATIVE
Specific Gravity, Urine: 1.025 (ref 1.000–1.030)
Total Protein, Urine: 30 — AB
Urine Glucose: NEGATIVE
Urobilinogen, UA: 0.2 (ref 0.0–1.0)
pH: 5.5 (ref 5.0–8.0)

## 2018-09-28 LAB — CBC WITH DIFFERENTIAL/PLATELET
Basophils Absolute: 0.1 10*3/uL (ref 0.0–0.1)
Basophils Relative: 1 % (ref 0.0–3.0)
Eosinophils Absolute: 0.4 10*3/uL (ref 0.0–0.7)
Eosinophils Relative: 8.4 % — ABNORMAL HIGH (ref 0.0–5.0)
HCT: 40.1 % (ref 39.0–52.0)
Hemoglobin: 13.4 g/dL (ref 13.0–17.0)
Lymphocytes Relative: 20.4 % (ref 12.0–46.0)
Lymphs Abs: 1 10*3/uL (ref 0.7–4.0)
MCHC: 33.3 g/dL (ref 30.0–36.0)
MCV: 88.7 fl (ref 78.0–100.0)
Monocytes Absolute: 0.6 10*3/uL (ref 0.1–1.0)
Monocytes Relative: 11.6 % (ref 3.0–12.0)
Neutro Abs: 3 10*3/uL (ref 1.4–7.7)
Neutrophils Relative %: 58.6 % (ref 43.0–77.0)
Platelets: 276 10*3/uL (ref 150.0–400.0)
RBC: 4.52 Mil/uL (ref 4.22–5.81)
RDW: 12.3 % (ref 11.5–15.5)
WBC: 5.1 10*3/uL (ref 4.0–10.5)

## 2018-09-28 LAB — VITAMIN B12: Vitamin B-12: >1500 pg/mL — ABNORMAL HIGH (ref 211–911)

## 2018-09-28 LAB — BASIC METABOLIC PANEL
BUN: 17 mg/dL (ref 6–23)
CO2: 31 mEq/L (ref 19–32)
Calcium: 9.3 mg/dL (ref 8.4–10.5)
Chloride: 102 mEq/L (ref 96–112)
Creatinine, Ser: 1.04 mg/dL (ref 0.40–1.50)
GFR: 83.45 mL/min (ref >60.00)
Glucose, Bld: 121 mg/dL — ABNORMAL HIGH (ref 70–99)
Potassium: 4.1 mEq/L (ref 3.5–5.1)
Sodium: 141 mEq/L (ref 135–145)

## 2018-09-28 LAB — HEPATIC FUNCTION PANEL
ALT: 446 U/L — ABNORMAL HIGH (ref 0–53)
AST: 192 U/L — ABNORMAL HIGH (ref 0–37)
Albumin: 4.4 g/dL (ref 3.5–5.2)
Alkaline Phosphatase: 496 U/L — ABNORMAL HIGH (ref 39–117)
Bilirubin, Direct: 0.1 mg/dL (ref 0.0–0.3)
Total Bilirubin: 0.6 mg/dL (ref 0.2–1.2)
Total Protein: 6.8 g/dL (ref 6.0–8.3)

## 2018-09-28 LAB — HEMOGLOBIN A1C: Hgb A1c MFr Bld: 5.9 % (ref 4.6–6.5)

## 2018-09-28 LAB — LIPID PANEL
Cholesterol: 208 mg/dL — ABNORMAL HIGH (ref 0–200)
HDL: 65 mg/dL (ref >39.00)
LDL Cholesterol: 110 mg/dL — ABNORMAL HIGH (ref 0–99)
NonHDL: 142.57
Total CHOL/HDL Ratio: 3
Triglycerides: 161 mg/dL — ABNORMAL HIGH (ref 0.0–149.0)
VLDL: 32.2 mg/dL (ref 0.0–40.0)

## 2018-09-28 LAB — PSA: PSA: 3.77 ng/mL (ref 0.10–4.00)

## 2018-09-28 LAB — VITAMIN D 25 HYDROXY (VIT D DEFICIENCY, FRACTURES): VITD: 29.29 ng/mL — ABNORMAL LOW (ref 30.00–100.00)

## 2018-09-28 LAB — IBC PANEL
Iron: 115 ug/dL (ref 42–165)
Saturation Ratios: 36.7 % (ref 20.0–50.0)
Transferrin: 224 mg/dL (ref 212.0–360.0)

## 2018-09-28 LAB — SEDIMENTATION RATE: Sed Rate: 42 mm/hr — ABNORMAL HIGH (ref 0–20)

## 2018-09-28 LAB — TSH: TSH: 0.61 u[IU]/mL (ref 0.35–4.50)

## 2018-09-28 NOTE — Assessment & Plan Note (Signed)
Etiology unclear, never smoker, for cxr, to f/u any worsening symptoms or concerns

## 2018-09-28 NOTE — Patient Instructions (Signed)
Your ears were irrigated of wax today  Please continue all other medications as before, and refills have been done if requested.  Please have the pharmacy call with any other refills you may need.  Please continue your efforts at being more active, low cholesterol diet, and weight control.  You are otherwise up to date with prevention measures today.  Please keep your appointments with your specialists as you may have planned  Please go to the XRAY Department in the Basement (go straight as you get off the elevator) for the x-ray testing  Please go to the LAB in the Basement (turn left off the elevator) for the tests to be done today  You will be contacted by phone if any changes need to be made immediately.  Otherwise, you will receive a letter about your results with an explanation, but please check with MyChart first.  Please remember to sign up for MyChart if you have not done so, as this will be important to you in the future with finding out test results, communicating by private email, and scheduling acute appointments online when needed.  Please return in 3 months, or sooner if needed

## 2018-09-28 NOTE — Assessment & Plan Note (Signed)
Asympt, for f/u psa 

## 2018-09-28 NOTE — Assessment & Plan Note (Signed)

## 2018-09-28 NOTE — Assessment & Plan Note (Addendum)
Resolved with wax impaction resolution  In addition to the time spent performing CPE, I spent an additional 25 minutes face to face,in which greater than 50% of this time was spent in counseling and coordination of care for patient's acute illness as documented, including the differential dx, treatment, further evaluation and other management of bilat hearing loss, PMR, wt loss, psa elevation and Hyperglycemia

## 2018-09-28 NOTE — Assessment & Plan Note (Signed)
asympt now but with wt loss, for ESR

## 2018-09-28 NOTE — Assessment & Plan Note (Signed)
stable overall by history and exam, recent data reviewed with pt, and pt to continue medical treatment as before,  to f/u any worsening symptoms or concerns, for a1c with labs 

## 2018-09-28 NOTE — Progress Notes (Signed)
Subjective:    Patient ID: [name] Good, male    DOB: [birth year], 78 y.o.   MRN: [id number]  HPI  Here for wellness and f/u;  Overall doing ok;  Pt denies Chest pain, worsening SOB, DOE, wheezing, orthopnea, PND, worsening LE edema, palpitations, dizziness or syncope.  Pt denies neurological change such as new headache, facial or extremity weakness.  Pt denies polydipsia, polyuria, or low sugar symptoms. Pt states overall good compliance with treatment and medications, good tolerability, and has been trying to follow appropriate diet.  Pt denies worsening depressive symptoms, suicidal ideation or panic. No fever, night sweats.    Pt states good ability with ADL's, has low fall risk, home safety reviewed and adequate, no other significant changes in hearing or vision, and only occasionally active with exercise.  Wt Readings from Last 3 Encounters:  09/28/18 170 lb (77.1 kg)  06/08/18 179 lb (81.2 kg)  04/14/18 179 lb (81.2 kg)  Also with c/o recent wt loss recent, about 10 lbs with less appetite for some reason but no cough and Denies worsening reflux, abd pain, dysphagia, n/v, bowel change or blood. Also with bilat hearing loss for the past wk without ear pain, d/c, HA or sinus congestion - thinks ? Wax again Past Medical History:  Diagnosis Date  . Balanitis    hx of  . Cataract   . Glaucoma   . Hx of adenomatous polyp of colon 03/09/2014  . Hyperlipidemia   . Hypertension   . Osteoarthritis    diffuse  . Stroke [facility])    pt states ? when had   Past Surgical History:  Procedure Laterality Date  . CATARACT EXTRACTION    . COLONOSCOPY  06-05-2003   hems only  . TONSILLECTOMY      reports that he quit smoking about 41 years ago. His smoking use included cigarettes. He has a 20.00 pack-year smoking history. He has never used smokeless tobacco. He reports that he does not drink alcohol or use drugs. family history includes Cancer in his father; Diabetes in his brother; Other  (age of onset: 97) in his mother. Allergies  Allergen Reactions  . Gemfibrozil     REACTION: unspecified  . Lipitor [Atorvastatin]     Uneasy feeling  . Niacin     REACTION: unspecified  . Penicillins     REACTION: unspecified  . Statins Other (See Comments)    myalgias  . Valacyclovir Hcl     REACTION: unspecified   Current Outpatient Medications on File Prior to Visit  Medication Sig Dispense Refill  . aspirin EC 81 MG tablet Take 1 tablet (81 mg total) by mouth daily. 90 tablet 11  . clobetasol ointment (TEMOVATE) 0.05 %   0  . latanoprost (XALATAN) 0.005 % ophthalmic solution   0  . lisinopril (PRINIVIL,ZESTRIL) 20 MG tablet TAKE 1 TABLET BY MOUTH ONCE DAILY 90 tablet 3  . multivitamin (THERAGRAN) tablet Take 1 tablet by mouth daily.      . pantoprazole (PROTONIX) 40 MG tablet TAKE 1 TABLET(40 MG) BY MOUTH DAILY 90 tablet 1   No current facility-administered medications on file prior to visit.    Review of Systems Constitutional: Negative for other unusual diaphoresis, sweats, appetite or weight changes HENT: Negative for other worsening hearing loss, ear pain, facial swelling, mouth sores or neck stiffness.   Eyes: Negative for other worsening pain, redness or other visual disturbance.  Respiratory: Negative for other stridor or swelling Cardiovascular: Negative for other  palpitations or other chest pain  Gastrointestinal: Negative for worsening diarrhea or loose stools, blood in stool, distention or other pain Genitourinary: Negative for hematuria, flank pain or other change in urine volume.  Musculoskeletal: Negative for myalgias or other joint swelling.  Skin: Negative for other color change, or other wound or worsening drainage.  Neurological: Negative for other syncope or numbness. Hematological: Negative for other adenopathy or swelling Psychiatric/Behavioral: Negative for hallucinations, other worsening agitation, SI, self-injury, or new decreased concentration All  other system neg per pt    Objective:   Physical Exam BP 124/82   Pulse 81   Temp 97.6 F (36.4 C) (Oral)   Ht 6' (1.829 m)   Wt 170 lb (77.1 kg)   SpO2 97%   BMI 23.06 kg/m  VS noted,  Constitutional: Pt is oriented to person, place, and time. Appears well-developed and well-nourished, in no significant distress and comfortable Head: Normocephalic and atraumatic  Eyes: Conjunctivae and EOM are normal. Pupils are equal, round, and reactive to light Right Ear: External ear normal without discharge Left Ear: External ear normal without discharge Bilateral ear wax impactions resolved with irrigation, hearing improved Nose: Nose without discharge or deformity Mouth/Throat: Oropharynx is without other ulcerations and moist  Neck: Normal range of motion. Neck supple. No JVD present. No tracheal deviation present or significant neck LA or mass Cardiovascular: Normal rate, regular rhythm, normal heart sounds and intact distal pulses.   Pulmonary/Chest: WOB normal and breath sounds without rales or wheezing  Abdominal: Soft. Bowel sounds are normal. NT. No HSM  Musculoskeletal: Normal range of motion. Exhibits no edema Lymphadenopathy: Has no other cervical adenopathy.  Neurological: Pt is alert and oriented to person, place, and time. Pt has normal reflexes. No cranial nerve deficit. Motor grossly intact, Gait intact Skin: Skin is warm and dry. No rash noted or new ulcerations Psychiatric:  Has normal mood and affect. Behavior is normal without agitation No other exam findings Lab Results  Component Value Date   WBC 3.7 (L) 02/22/2018   HGB 13.4 02/22/2018   HCT 39.6 02/22/2018   PLT 226.0 02/22/2018   GLUCOSE 100 (H) 02/22/2018   CHOL 233 (H) 02/22/2018   TRIG 158.0 (H) 02/22/2018   HDL 62.80 02/22/2018   LDLDIRECT 123.0 01/12/2017   LDLCALC 139 (H) 02/22/2018   ALT 20 02/22/2018   AST 18 02/22/2018   NA 140 02/22/2018   K 4.3 02/22/2018   CL 103 02/22/2018   CREATININE 1.07  02/22/2018   BUN 16 02/22/2018   CO2 31 02/22/2018   TSH 0.75 02/22/2018   PSA 3.24 02/22/2018   HGBA1C 5.8 02/22/2018       Assessment & Plan:

## 2018-09-29 ENCOUNTER — Telehealth: Payer: Self-pay

## 2018-09-29 NOTE — Telephone Encounter (Signed)
-----   Message from [name], MD sent at 09/28/2018  6:18 PM EDT ----- Left message on MyChart, pt to cont same tx except  The test results show that your current treatment is OK, except the Liver Tests are markedly high, and could be related to possible Gallbladder or Liver disease.  This may be in relation to the weight loss as well.  We will need to check a "stat" Abdomen Ultrasound,  You should hear from the office soon. Redmond Baseman to please inform pt, I will do order,  ALSO - can we try for addon lab - acute hepatitis profile - abnormal LFT's

## 2018-09-29 NOTE — Telephone Encounter (Signed)
Called pt, LVM.   CRM created.  

## 2018-10-12 ENCOUNTER — Ambulatory Visit
Admission: RE | Admit: 2018-10-12 | Discharge: 2018-10-12 | Disposition: A | Discharge status: Home / Self Care | Payer: PRIVATE HEALTH INSURANCE | Source: Ambulatory Visit | Attending: Internal Medicine | Admitting: Internal Medicine

## 2018-10-12 DIAGNOSIS — R7989 Other specified abnormal findings of blood chemistry: Secondary | ICD-10-CM

## 2018-10-12 DIAGNOSIS — R945 Abnormal results of liver function studies: Secondary | ICD-10-CM

## 2018-10-12 DIAGNOSIS — N281 Cyst of kidney, acquired: Secondary | ICD-10-CM | POA: Diagnosis not present

## 2018-10-13 ENCOUNTER — Encounter: Payer: Self-pay | Admitting: Internal Medicine

## 2018-10-13 ENCOUNTER — Other Ambulatory Visit: Payer: Self-pay | Admitting: Internal Medicine

## 2018-10-13 DIAGNOSIS — N2889 Other specified disorders of kidney and ureter: Secondary | ICD-10-CM

## 2018-10-14 ENCOUNTER — Telehealth: Payer: Self-pay

## 2018-10-14 NOTE — Telephone Encounter (Signed)
Pt has been informed of results and expressed understanding.  °

## 2018-10-14 NOTE — Telephone Encounter (Signed)
-----   Message from [name], MD sent at 10/13/2018  8:03 PM EDT ----- Left message on MyChart, pt to cont same tx except  The test results show that your current treatment is OK, as the liver seems OK with the ultrasound.  However, There is the suggestion of some kind of "spot" or "swelling" that has to do with the left kidney.  This may or may not be anything significant, but because the of the recent history of weight loss, we should check a CT scan of the Abdomen/pelvis to rule out a serious problem such as even cancer.    [name] Good to please inform pt, I will do order

## 2018-10-19 DIAGNOSIS — L409 Psoriasis, unspecified: Secondary | ICD-10-CM | POA: Diagnosis not present

## 2018-10-24 DIAGNOSIS — H401133 Primary open-angle glaucoma, bilateral, severe stage: Secondary | ICD-10-CM | POA: Diagnosis not present

## 2018-11-08 ENCOUNTER — Ambulatory Visit
Admission: RE | Admit: 2018-11-08 | Discharge: 2018-11-08 | Disposition: A | Discharge status: Home / Self Care | Payer: Medicare HMO | Source: Ambulatory Visit | Attending: Internal Medicine | Admitting: Internal Medicine

## 2018-11-08 ENCOUNTER — Encounter: Payer: Self-pay | Admitting: Internal Medicine

## 2018-11-08 DIAGNOSIS — N281 Cyst of kidney, acquired: Secondary | ICD-10-CM | POA: Diagnosis not present

## 2018-11-08 DIAGNOSIS — N2889 Other specified disorders of kidney and ureter: Secondary | ICD-10-CM

## 2018-11-08 MED ORDER — IOPAMIDOL (ISOVUE-300) INJECTION 61%
100.0000 mL | Freq: Once | INTRAVENOUS | Status: AC | PRN
  Administered 2018-11-08: 100 mL via INTRAVENOUS

## 2018-11-09 ENCOUNTER — Telehealth: Payer: Self-pay

## 2018-11-09 NOTE — Telephone Encounter (Signed)
Pt has been informed of results and expressed understanding.  °

## 2018-11-09 NOTE — Telephone Encounter (Signed)
-----   Message from [name], MD sent at 11/08/2018  5:20 PM EDT ----- Letter sent, cont same tx   .The test results show that your current treatment is OK, as the CT scan did Not show any kidney mass or other significant acute abnormality.Redmond Baseman to please inform pt

## 2018-11-14 ENCOUNTER — Ambulatory Visit: Payer: Self-pay | Admitting: Internal Medicine

## 2018-11-14 NOTE — Telephone Encounter (Signed)
I returned pt's call regarding being stung on his hand this morning by a little wasp.  He denies any symptoms at all.   No pain, swelling, itching, redness, shortness of breath or chest discomfort, throat feeling tight or tongue swelling.    No hives.   "My wife just wanted me to call and check in".   "You know how wives can be?!"     I made him aware of the s/s/ to watch for and if he experiences any shortness of breath, etc to call 911 and get to the ED.   Don't wait.    He verbalized understanding and thanked me for my help.    Reason for Disposition . Normal local reaction to bee, wasp, or yellow jacket sting    No reactions or symptoms  Answer Assessment - Initial Assessment Questions 1. TYPE: "What type of sting was it?" (bee, yellow jacket, etc.)      I went to mailbox and got stung by a bee.   I passed out coming from the mailbox to the office.    This happened at 11:00 AM today. 2. ONSET: "When did it occur?"      11:00 Am this morning 3. LOCATION: "Where is the sting located?"  "How many stings?"     Right hand on the back.   Once 4. SWELLING SIZE: "How big is the swelling?" (e.g., inches or cm)     No swelling. 5. REDNESS: "Is the area red or pink?" If so, ask "What size is area of redness?" (e.g., inches or cm). "When did the redness start?"     No redness or itching. 6. PAIN: "Is there any pain?" If so, ask: "How bad is it?"  (Scale 1-10; or mild, moderate, severe)     No 7. ITCHING: "Is there any itching?" If so, ask: "How bad is it?"      No 8. RESPIRATORY DISTRESS: "Describe your breathing."     No 9. PRIOR REACTIONS: "Have you had any severe allergic reactions to stings in the past?" if yes, ask: "What happened?"     No 10. OTHER SYMPTOMS: "Do you have any other symptoms?" (e.g., abdominal pain, face or tongue swelling, new rash elsewhere, vomiting)       No 11. PREGNANCY: "Is there any chance you are pregnant?" "When was your last menstrual period?"        N/A  Protocols used: BEE OR YELLOW JACKET STING-A-AH

## 2018-12-30 ENCOUNTER — Encounter: Payer: Self-pay | Admitting: Internal Medicine

## 2018-12-30 ENCOUNTER — Ambulatory Visit (INDEPENDENT_AMBULATORY_CARE_PROVIDER_SITE_OTHER): Payer: Medicare HMO | Admitting: Internal Medicine

## 2018-12-30 ENCOUNTER — Other Ambulatory Visit: Payer: Self-pay

## 2018-12-30 ENCOUNTER — Ambulatory Visit: Payer: PRIVATE HEALTH INSURANCE | Admitting: Internal Medicine

## 2018-12-30 ENCOUNTER — Other Ambulatory Visit (INDEPENDENT_AMBULATORY_CARE_PROVIDER_SITE_OTHER): Payer: Medicare HMO

## 2018-12-30 ENCOUNTER — Ambulatory Visit (INDEPENDENT_AMBULATORY_CARE_PROVIDER_SITE_OTHER)
Admission: RE | Admit: 2018-12-30 | Discharge: 2018-12-30 | Disposition: A | Discharge status: Home / Self Care | Payer: Medicare HMO | Source: Ambulatory Visit | Attending: Internal Medicine | Admitting: Internal Medicine

## 2018-12-30 VITALS — BP 126/84 | HR 91 | Temp 98.2°F | Ht 72.0 in | Wt 160.0 lb

## 2018-12-30 DIAGNOSIS — F32A Depression, unspecified: Secondary | ICD-10-CM | POA: Insufficient documentation

## 2018-12-30 DIAGNOSIS — R739 Hyperglycemia, unspecified: Secondary | ICD-10-CM

## 2018-12-30 DIAGNOSIS — F329 Major depressive disorder, single episode, unspecified: Secondary | ICD-10-CM

## 2018-12-30 DIAGNOSIS — H9193 Unspecified hearing loss, bilateral: Secondary | ICD-10-CM | POA: Diagnosis not present

## 2018-12-30 DIAGNOSIS — R7989 Other specified abnormal findings of blood chemistry: Secondary | ICD-10-CM

## 2018-12-30 DIAGNOSIS — R945 Abnormal results of liver function studies: Secondary | ICD-10-CM

## 2018-12-30 DIAGNOSIS — R634 Abnormal weight loss: Secondary | ICD-10-CM | POA: Diagnosis not present

## 2018-12-30 DIAGNOSIS — I10 Essential (primary) hypertension: Secondary | ICD-10-CM

## 2018-12-30 LAB — HEPATIC FUNCTION PANEL
ALT: 24 U/L (ref 0–53)
AST: 18 U/L (ref 0–37)
Albumin: 4.4 g/dL (ref 3.5–5.2)
Alkaline Phosphatase: 94 U/L (ref 39–117)
Bilirubin, Direct: 0.1 mg/dL (ref 0.0–0.3)
Total Bilirubin: 0.4 mg/dL (ref 0.2–1.2)
Total Protein: 7 g/dL (ref 6.0–8.3)

## 2018-12-30 LAB — BASIC METABOLIC PANEL
BUN: 22 mg/dL (ref 6–23)
CO2: 29 mEq/L (ref 19–32)
Calcium: 9.3 mg/dL (ref 8.4–10.5)
Chloride: 105 mEq/L (ref 96–112)
Creatinine, Ser: 1.05 mg/dL (ref 0.40–1.50)
GFR: 82.48 mL/min (ref >60.00)
Glucose, Bld: 90 mg/dL (ref 70–99)
Potassium: 4 mEq/L (ref 3.5–5.1)
Sodium: 141 mEq/L (ref 135–145)

## 2018-12-30 LAB — CBC WITH DIFFERENTIAL/PLATELET
Basophils Absolute: 0 10*3/uL (ref 0.0–0.1)
Basophils Relative: 1.2 % (ref 0.0–3.0)
Eosinophils Absolute: 0.2 10*3/uL (ref 0.0–0.7)
Eosinophils Relative: 4.1 % (ref 0.0–5.0)
HCT: 40 % (ref 39.0–52.0)
Hemoglobin: 13.2 g/dL (ref 13.0–17.0)
Lymphocytes Relative: 28.6 % (ref 12.0–46.0)
Lymphs Abs: 1.1 10*3/uL (ref 0.7–4.0)
MCHC: 33.1 g/dL (ref 30.0–36.0)
MCV: 88.1 fl (ref 78.0–100.0)
Monocytes Absolute: 0.6 10*3/uL (ref 0.1–1.0)
Monocytes Relative: 15.7 % — ABNORMAL HIGH (ref 3.0–12.0)
Neutro Abs: 1.9 10*3/uL (ref 1.4–7.7)
Neutrophils Relative %: 50.4 % (ref 43.0–77.0)
Platelets: 231 10*3/uL (ref 150.0–400.0)
RBC: 4.54 Mil/uL (ref 4.22–5.81)
RDW: 12.8 % (ref 11.5–15.5)
WBC: 3.8 10*3/uL — ABNORMAL LOW (ref 4.0–10.5)

## 2018-12-30 LAB — HEMOGLOBIN A1C: Hgb A1c MFr Bld: 5.7 % (ref 4.6–6.5)

## 2018-12-30 MED ORDER — CITALOPRAM HYDROBROMIDE 10 MG PO TABS: 10.0000 mg | ORAL_TABLET | Freq: Every day | ORAL | 3 refills | Status: DC

## 2018-12-30 NOTE — Patient Instructions (Signed)
Your ears were cleared of wax by irrigation today  Please take all new medication as prescribed - the celexa 10 mg per day  Please continue all other medications as before, and refills have been done if requested.  Please have the pharmacy call with any other refills you may need.  Please continue your efforts at being more active, low cholesterol diet, and weight control.  Please keep your appointments with your specialists as you may have planned  You will be contacted regarding the referral for: Gastroenterology  Please go to the XRAY Department in the Basement (go straight as you get off the elevator) for the x-ray testing  Please go to the LAB in the Basement (turn left off the elevator) for the tests to be done today  You will be contacted by phone if any changes need to be made immediately.  Otherwise, you will receive a letter about your results with an explanation, but please check with MyChart first.  Please remember to sign up for MyChart if you have not done so, as this will be important to you in the future with finding out test results, communicating by private email, and scheduling acute appointments online when needed.  Please return in 3 months, or sooner if needed

## 2018-12-30 NOTE — Progress Notes (Signed)
Subjective:    Patient ID: [name] Good, male    DOB: [birth year], 78 y.o.   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  Also has bialteral hearing loss for at least the last wk, thinks maybe the wax has come back.  Pt denies chest pain, increased sob or doe, wheezing, orthopnea, PND, increased LE swelling, palpitations, dizziness or syncope.  Pt denies new neurological symptoms such as new headache, or facial or extremity weakness or numbness   Pt denies polydipsia, polyuria.  Denies worsening reflux, abd pain, dysphagia, n/v, bowel change or blood.  Elevated LFTs noted on last labs but pt did not see GI as referred Past Medical History:  Diagnosis Date  . Balanitis    hx of  . Cataract   . Glaucoma   . Hx of adenomatous polyp of colon 03/09/2014  . Hyperlipidemia   . Hypertension   . Osteoarthritis    diffuse  . Stroke [facility])    pt states ? when had   Past Surgical History:  Procedure Laterality Date  . CATARACT EXTRACTION    . COLONOSCOPY  06-05-2003   hems only  . TONSILLECTOMY      reports that he quit smoking about 41 years ago. His smoking use included cigarettes. He has a 20.00 pack-year smoking history. He has never used smokeless tobacco. He reports that he does not drink alcohol or use drugs. family history includes Cancer in his father; Diabetes in his brother; Other (age of onset: 42) in his mother. Allergies  Allergen Reactions  . Gemfibrozil     REACTION: unspecified  . Lipitor [Atorvastatin]     Uneasy feeling  . Niacin     REACTION: unspecified  . Penicillins    REACTION: unspecified  . Statins Other (See Comments)    myalgias  . Valacyclovir Hcl     REACTION: unspecified   Current Outpatient Medications on File Prior to Visit  Medication Sig Dispense Refill  . aspirin EC 81 MG tablet Take 1 tablet (81 mg total) by mouth daily. 90 tablet 11  . clobetasol ointment (TEMOVATE) 0.05 %   0  . latanoprost (XALATAN) 0.005 % ophthalmic solution   0  . lisinopril (PRINIVIL,ZESTRIL) 20 MG tablet TAKE 1 TABLET BY MOUTH ONCE DAILY 90 tablet 3  . multivitamin (THERAGRAN) tablet Take 1 tablet by mouth daily.      . pantoprazole (PROTONIX) 40 MG tablet TAKE 1 TABLET(40 MG) BY MOUTH DAILY 90 tablet 1   No current facility-administered medications on file prior to visit.    Review of Systems  Constitutional: Negative for other unusual diaphoresis or sweats HENT: Negative for ear discharge or swelling Eyes: Negative for other worsening visual disturbances Respiratory: Negative for stridor or other swelling  Gastrointestinal: Negative for worsening distension or other blood Genitourinary: Negative for retention or other urinary change Musculoskeletal: Negative for other MSK pain or swelling Skin: Negative for color change or other new lesions Neurological: Negative for worsening tremors and other numbness  Psychiatric/Behavioral: Negative for worsening agitation or other fatigue All other system neg per  pt    Objective:   Physical Exam BP 126/84   Pulse 91   Temp 98.2 F (36.8 C) (Oral)   Ht 6' (1.829 m)   Wt 160 lb (72.6 kg)   SpO2 97%   BMI 21.70 kg/m  VS noted,  Constitutional: Pt is oriented to person, place, and time. Appears well-developed and well-nourished, in no significant distress and comfortable Head: Normocephalic and atraumatic  Eyes: Conjunctivae and EOM are normal. Pupils are equal, round, and reactive to light Right Ear: External ear normal without discharge Left Ear: External ear normal without discharge Nose: Nose without  discharge or deformity Mouth/Throat: Oropharynx is without other ulcerations and moist  Neck: Normal range of motion. Neck supple. No JVD present. No tracheal deviation present or significant neck LA or mass Cardiovascular: Normal rate, regular rhythm, normal heart sounds and intact distal pulses.   Pulmonary/Chest: WOB normal and breath sounds without rales or wheezing  Abdominal: Soft. Bowel sounds are normal. NT. No HSM  Musculoskeletal: Normal range of motion. Exhibits no edema Lymphadenopathy: Has no other cervical adenopathy.  Neurological: Pt is alert and oriented to person, place, and time. Pt has normal reflexes. No cranial nerve deficit. Motor grossly intact, Gait intact Skin: Skin is warm and dry. No rash noted or new ulcerations Psychiatric:  Has depressed mood and affect. Behavior is normal without agitation No other exam findings Lab Results  Component Value Date   WBC 3.8 (L) 12/30/2018   HGB 13.2 12/30/2018   HCT 40.0 12/30/2018   PLT 231.0 12/30/2018   GLUCOSE 90 12/30/2018   CHOL 208 (H) 09/28/2018   TRIG 161.0 (H) 09/28/2018   HDL 65.00 09/28/2018   LDLDIRECT 123.0 01/12/2017   LDLCALC 110 (H) 09/28/2018   ALT 24 12/30/2018   AST 18 12/30/2018   NA 141 12/30/2018   K 4.0 12/30/2018   CL 105 12/30/2018   CREATININE 1.05 12/30/2018   BUN 22 12/30/2018   CO2 29 12/30/2018   TSH 0.61 09/28/2018   PSA 3.77 09/28/2018   HGBA1C 5.7 12/30/2018        Assessment & Plan:

## 2018-12-31 ENCOUNTER — Encounter: Payer: Self-pay | Admitting: Internal Medicine

## 2018-12-31 NOTE — Assessment & Plan Note (Signed)
For oral repalcement,  to f/u any worsening symptoms or concerns

## 2018-12-31 NOTE — Assessment & Plan Note (Signed)
stable overall by history and exam, recent data reviewed with pt, and pt to continue medical treatment as before,  to f/u any worsening symptoms or concerns  

## 2018-12-31 NOTE — Assessment & Plan Note (Signed)
Improved s/p bilateral ear irrigation and wax removed,  to f/u any worsening symptoms or concerns

## 2018-12-31 NOTE — Assessment & Plan Note (Addendum)
Etiology unclear, for repeat labs, cxr, but suspect related to depression  Note:  Total time for pt hx, exam, review of record with pt in the room, determination of diagnoses and plan for further eval and tx is > 40 min, with over 50% spent in coordination and counseling of patient including the differential dx, tx, further evaluation and other management of wt loss, low vit d, depression, bilateral hearing loss, abnormal lfts, hyperglcemia, HTN

## 2018-12-31 NOTE — Assessment & Plan Note (Signed)
Mild to mod, No SI or HI, for celexa 10 qd,,  to f/u any worsening symptoms or concerns

## 2018-12-31 NOTE — Assessment & Plan Note (Signed)
For f/u lab,  ? ETOH related, for hep serologies, to f/u any worsening symptoms or concerns

## 2019-01-13 ENCOUNTER — Encounter: Payer: Self-pay | Admitting: Internal Medicine

## 2019-02-22 ENCOUNTER — Other Ambulatory Visit (INDEPENDENT_AMBULATORY_CARE_PROVIDER_SITE_OTHER): Payer: Medicare HMO

## 2019-02-22 ENCOUNTER — Ambulatory Visit (INDEPENDENT_AMBULATORY_CARE_PROVIDER_SITE_OTHER): Payer: Medicare HMO | Admitting: Internal Medicine

## 2019-02-22 ENCOUNTER — Encounter: Payer: Self-pay | Admitting: Internal Medicine

## 2019-02-22 VITALS — BP 146/88 | HR 80 | Temp 97.9°F | Ht 72.0 in | Wt 163.0 lb

## 2019-02-22 DIAGNOSIS — R7989 Other specified abnormal findings of blood chemistry: Secondary | ICD-10-CM

## 2019-02-22 DIAGNOSIS — R634 Abnormal weight loss: Secondary | ICD-10-CM

## 2019-02-22 DIAGNOSIS — R945 Abnormal results of liver function studies: Secondary | ICD-10-CM | POA: Diagnosis not present

## 2019-02-22 DIAGNOSIS — Z636 Dependent relative needing care at home: Secondary | ICD-10-CM | POA: Diagnosis not present

## 2019-02-22 LAB — HEPATIC FUNCTION PANEL
ALT: 25 U/L (ref 0–53)
AST: 19 U/L (ref 0–37)
Albumin: 4.6 g/dL (ref 3.5–5.2)
Alkaline Phosphatase: 74 U/L (ref 39–117)
Bilirubin, Direct: 0.1 mg/dL (ref 0.0–0.3)
Total Bilirubin: 0.5 mg/dL (ref 0.2–1.2)
Total Protein: 7.2 g/dL (ref 6.0–8.3)

## 2019-02-22 NOTE — Progress Notes (Signed)
[name] Good 78 y.o. [birth year] [id number]  Assessment & Plan:   Encounter Diagnoses  Name Primary?  . Loss of weight Yes  . Abnormal LFTs   . Caregiver burden - wife has dementia    What ever he had seems to be resolved and improving.  I suspect he had some viral syndrome.  I could not pick up anything in particular from his history, question if he had a mononucleosis syndrome.  He was very fatigued.  His weight is still down but starting to take up and he reports that he is well.  His labs were all normal in September and I repeated his LFTs today and they are normal again.  He will see me as needed.  No further work-up necessary   Lab Results  Component Value Date   ALT 25 02/22/2019   AST 19 02/22/2019   ALKPHOS 74 02/22/2019   BILITOT 0.5 02/22/2019    I appreciate the opportunity to care for this patient. CC: [name] Borg, MD     Subjective:   Chief Complaint:  HPI The patient is a 78 year old African-American man known to me from previous colonoscopy with diminutive adenomas (no recall anticipated) who had markedly abnormal transaminases and alkaline phosphatase in June associated with weight loss and general malaise.  These are reviewed in epic, he saw Dr. Jenny Good in follow-up, in September.  LFTs all normalized.  CBC has been normal.  CT scan and abdominal ultrasound had been performed the ultrasound suggested some renal cysts those checked out okay on the CT scan and the liver and biliary tract and everything else was negative.  He says his appetite is back and he is gaining weight.  He has caregiver burden situational stress with a demented wife.  Some of his family comes in and helps him.  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  His GI review of systems is otherwise unremarkable.  Wt Readings from Last 3 Encounters:  02/22/19 163 lb (73.9 kg)  12/30/18 160 lb (72.6 kg)  09/28/18 170 lb (77.1 kg)  179    Allergies  Allergen Reactions  . Gemfibrozil     REACTION: unspecified  . Lipitor [Atorvastatin]     Uneasy feeling  . Niacin     REACTION: unspecified  . Penicillins     REACTION: unspecified  . Statins Other (See Comments)    myalgias  . Valacyclovir Hcl     REACTION: unspecified   Current Meds  Medication Sig  . aspirin EC 81 MG tablet Take 1 tablet (81 mg total) by mouth daily.  . clobetasol ointment (TEMOVATE) 0.05 %   . latanoprost (XALATAN) 0.005 % ophthalmic solution   . lisinopril (PRINIVIL,ZESTRIL) 20 MG tablet TAKE 1 TABLET BY MOUTH ONCE DAILY  . multivitamin (THERAGRAN) tablet Take 1 tablet by mouth daily.     Past Medical History:  Diagnosis Date  . Balanitis    hx of  . Cataract   . Glaucoma   . Hx of adenomatous polyp of colon 03/09/2014  . Hyperlipidemia   . Hypertension   . Osteoarthritis    diffuse  . Stroke [facility])    pt states ? when had   Past Surgical History:  Procedure Laterality Date  . CATARACT EXTRACTION    . COLONOSCOPY  06-05-2003   hems only  . TONSILLECTOMY     Social History   Social History Narrative   HSG,  [place], [place]. Married '62.    No children. 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 End of Life Care - provided packet (July '12)      Minister; Still pastor   family history includes Cancer in his father; Diabetes in his brother; Other (age of onset: 56) in his mother.   Review of Systems As per HPI, he has some itching he has psoriasis he says, decreased hearing, chronic back pain and joint pains.  Mild pedal edema and he has some urinary incontinence at times.  All other review of systems are negative or as per HPI.  Objective:   Physical Exam @BP  (!) 146/88   Pulse 80   Temp 97.9 F (36.6 C)   Ht 6' (1.829 m)   Wt 163 lb (73.9 kg)   BMI 22.11 kg/m @  General:  Elderly  Mildly chronically ill, well-nourished and in no acute distress Eyes:  anicteric. Neck:   supple w/o thyromegaly or  mass.  Lungs: Clear to auscultation bilaterally. Heart:   S1S2, no rubs, murmurs, gallops. Abdomen:  soft, non-tender, no hepatosplenomegaly, hernia, or mass and BS+.  Lymph:  no cervical or supraclavicular adenopathy. Extremities:   no edema, cyanosis or clubbing Neuro:  A&O x 3.  Psych:  appropriate mood and  Affect.   Data Reviewed: Primary care notes CT scans ultrasound labs in the EMR

## 2019-02-22 NOTE — Patient Instructions (Signed)
Good to see you today.  I suspect you had an infection this summer and the body healed itself.  Rechecking liver tests today to make sure they are ok.  Keep eating and gain some more weight back  I appreciate the opportunity to care for you. Gatha Mayer, MD, [facility]

## 2019-02-23 NOTE — Progress Notes (Signed)
LFT's all back to normal - still As we discussed - whatever caused his problems appears over  See me prn

## 2019-02-24 ENCOUNTER — Ambulatory Visit: Payer: Medicare Other | Admitting: Internal Medicine

## 2019-02-27 DIAGNOSIS — H18413 Arcus senilis, bilateral: Secondary | ICD-10-CM | POA: Diagnosis not present

## 2019-02-27 DIAGNOSIS — H401133 Primary open-angle glaucoma, bilateral, severe stage: Secondary | ICD-10-CM | POA: Diagnosis not present

## 2019-02-27 DIAGNOSIS — Z961 Presence of intraocular lens: Secondary | ICD-10-CM | POA: Diagnosis not present

## 2019-03-31 ENCOUNTER — Ambulatory Visit: Payer: Medicare HMO | Admitting: Internal Medicine

## 2019-04-17 ENCOUNTER — Other Ambulatory Visit: Payer: Self-pay | Admitting: Internal Medicine

## 2019-04-17 MED ORDER — LISINOPRIL 20 MG PO TABS: 20.0000 mg | ORAL_TABLET | Freq: Every day | ORAL | 0 refills | Status: DC

## 2019-04-17 NOTE — Telephone Encounter (Signed)
lisinopril (PRINIVIL,ZESTRIL) 20 MG tablet  [id number] - [place], [address] Phone:  [phone]  Fax:  [phone]     Pls refill for last appt was in Sept. Needs 90 day supply

## 2019-05-23 ENCOUNTER — Ambulatory Visit (INDEPENDENT_AMBULATORY_CARE_PROVIDER_SITE_OTHER): Payer: Medicare HMO | Admitting: Internal Medicine

## 2019-05-23 ENCOUNTER — Encounter: Payer: Self-pay | Admitting: Internal Medicine

## 2019-05-23 ENCOUNTER — Other Ambulatory Visit: Payer: Self-pay

## 2019-05-23 VITALS — BP 138/80 | HR 78 | Temp 97.6°F | Ht 72.0 in | Wt 159.0 lb

## 2019-05-23 DIAGNOSIS — R739 Hyperglycemia, unspecified: Secondary | ICD-10-CM

## 2019-05-23 DIAGNOSIS — Z0001 Encounter for general adult medical examination with abnormal findings: Secondary | ICD-10-CM | POA: Diagnosis not present

## 2019-05-23 DIAGNOSIS — M79673 Pain in unspecified foot: Secondary | ICD-10-CM

## 2019-05-23 DIAGNOSIS — Z0000 Encounter for general adult medical examination without abnormal findings: Secondary | ICD-10-CM

## 2019-05-23 DIAGNOSIS — F32A Depression, unspecified: Secondary | ICD-10-CM

## 2019-05-23 DIAGNOSIS — R972 Elevated prostate specific antigen [PSA]: Secondary | ICD-10-CM | POA: Diagnosis not present

## 2019-05-23 DIAGNOSIS — I10 Essential (primary) hypertension: Secondary | ICD-10-CM

## 2019-05-23 DIAGNOSIS — K4090 Unilateral inguinal hernia, without obstruction or gangrene, not specified as recurrent: Secondary | ICD-10-CM

## 2019-05-23 DIAGNOSIS — F329 Major depressive disorder, single episode, unspecified: Secondary | ICD-10-CM

## 2019-05-23 DIAGNOSIS — Z23 Encounter for immunization: Secondary | ICD-10-CM

## 2019-05-23 LAB — CBC WITH DIFFERENTIAL/PLATELET
Basophils Absolute: 0 10*3/uL (ref 0.0–0.1)
Basophils Relative: 0.8 % (ref 0.0–3.0)
Eosinophils Absolute: 0.1 10*3/uL (ref 0.0–0.7)
Eosinophils Relative: 2.8 % (ref 0.0–5.0)
HCT: 40.2 % (ref 39.0–52.0)
Hemoglobin: 13.4 g/dL (ref 13.0–17.0)
Lymphocytes Relative: 33.6 % (ref 12.0–46.0)
Lymphs Abs: 1.1 10*3/uL (ref 0.7–4.0)
MCHC: 33.4 g/dL (ref 30.0–36.0)
MCV: 88.7 fl (ref 78.0–100.0)
Monocytes Absolute: 0.4 10*3/uL (ref 0.1–1.0)
Monocytes Relative: 13.7 % — ABNORMAL HIGH (ref 3.0–12.0)
Neutro Abs: 1.6 10*3/uL (ref 1.4–7.7)
Neutrophils Relative %: 49.1 % (ref 43.0–77.0)
Platelets: 209 10*3/uL (ref 150.0–400.0)
RBC: 4.54 Mil/uL (ref 4.22–5.81)
RDW: 12.4 % (ref 11.5–15.5)
WBC: 3.2 10*3/uL — ABNORMAL LOW (ref 4.0–10.5)

## 2019-05-23 LAB — LIPID PANEL
Cholesterol: 253 mg/dL — ABNORMAL HIGH (ref 0–200)
HDL: 77.8 mg/dL (ref >39.00)
LDL Cholesterol: 156 mg/dL — ABNORMAL HIGH (ref 0–99)
NonHDL: 175.1
Total CHOL/HDL Ratio: 3
Triglycerides: 94 mg/dL (ref 0.0–149.0)
VLDL: 18.8 mg/dL (ref 0.0–40.0)

## 2019-05-23 LAB — BASIC METABOLIC PANEL
BUN: 20 mg/dL (ref 6–23)
CO2: 32 mEq/L (ref 19–32)
Calcium: 9.6 mg/dL (ref 8.4–10.5)
Chloride: 102 mEq/L (ref 96–112)
Creatinine, Ser: 0.98 mg/dL (ref 0.40–1.50)
GFR: 89.22 mL/min (ref >60.00)
Glucose, Bld: 99 mg/dL (ref 70–99)
Potassium: 4 mEq/L (ref 3.5–5.1)
Sodium: 141 mEq/L (ref 135–145)

## 2019-05-23 LAB — HEPATIC FUNCTION PANEL
ALT: 23 U/L (ref 0–53)
AST: 18 U/L (ref 0–37)
Albumin: 4.6 g/dL (ref 3.5–5.2)
Alkaline Phosphatase: 66 U/L (ref 39–117)
Bilirubin, Direct: 0.1 mg/dL (ref 0.0–0.3)
Total Bilirubin: 0.6 mg/dL (ref 0.2–1.2)
Total Protein: 6.9 g/dL (ref 6.0–8.3)

## 2019-05-23 LAB — HEMOGLOBIN A1C: Hgb A1c MFr Bld: 5.6 % (ref 4.6–6.5)

## 2019-05-23 MED ORDER — CITALOPRAM HYDROBROMIDE 10 MG PO TABS: 10.0000 mg | ORAL_TABLET | Freq: Every day | ORAL | 3 refills | Status: DC

## 2019-05-23 NOTE — Patient Instructions (Signed)
You had the Tdap tetanus shot today  Please take all new medication as prescribed - the celexa 10 mg per day for depression  You will be contacted regarding the referral for: general surgury  Please continue all other medications as before, and refills have been done if requested.  Please have the pharmacy call with any other refills you may need.  Please continue your efforts at being more active, low cholesterol diet, and weight control.  You are otherwise up to date with prevention measures today.  Please keep your appointments with your specialists as you may have planned  Please go to the LAB at the blood drawing area for the tests to be done  You will be contacted by phone if any changes need to be made immediately.  Otherwise, you will receive a letter about your results with an explanation, but please check with MyChart first.  Please remember to sign up for MyChart if you have not done so, as this will be important to you in the future with finding out test results, communicating by private email, and scheduling acute appointments online when needed.  Please make an Appointment to return in 6 months, or sooner if needed

## 2019-05-23 NOTE — Assessment & Plan Note (Signed)

## 2019-05-23 NOTE — Assessment & Plan Note (Signed)
stable overall by history and exam, recent data reviewed with pt, and pt to continue medical treatment as before,  to f/u any worsening symptoms or concerns  

## 2019-05-23 NOTE — Assessment & Plan Note (Addendum)
New onset, for general surgury referral  I spent 31 minutes preparing to see the patient by review of recent labs, imaging and procedures, obtaining and reviewing separately obtained history, communicating with the patient and family or caregiver, ordering medications, tests or procedures, and documenting clinical information in the EHR including the differential Dx, treatment, and any further evaluation and other management of right inguinal hernia, psa elevation, hyperglycemia, HTN, depression

## 2019-05-23 NOTE — Assessment & Plan Note (Signed)
Moderate persistent, for celexa 10 qd

## 2019-05-23 NOTE — Progress Notes (Signed)
Subjective:    Patient ID: [name] Good, male    DOB: 26-Jan-1941, 79 y.o.   MRN: [id number]  HPI  Here for wellness and f/u;  Overall doing ok;  Pt denies Chest pain, worsening SOB, DOE, wheezing, orthopnea, PND, worsening LE edema, palpitations, dizziness or syncope.  Pt denies neurological change such as new headache, facial or extremity weakness.  Pt denies polydipsia, polyuria, or low sugar symptoms. Pt states overall good compliance with treatment and medications, good tolerability, and has been trying to follow appropriate diet.  Pt denies worsening depressive symptoms, suicidal ideation or panic. No fever, night sweats, wt loss, loss of appetite, or other constitutional symptoms.  Pt states good ability with ADL's, has low fall risk, home safety reviewed and adequate, no other significant changes in hearing or vision, and only occasionally active with exercise. Has not yet started celexa but willing due no due to worsening depresison but no suicidal ideation, or panic; has ongoing anxiety, not increased recently.  ALso has 1 wk onset right inguinal swelling, no pain, goes down with lying down Past Medical History:  Diagnosis Date  . Balanitis    hx of  . Cataract   . Glaucoma   . Hx of adenomatous polyp of colon 03/09/2014  . Hyperlipidemia   . Hypertension   . Osteoarthritis    diffuse  . Stroke [facility])    pt states ? when had   Past Surgical History:  Procedure Laterality Date  . CATARACT EXTRACTION    . COLONOSCOPY  06-05-2003   hems only  . TONSILLECTOMY      reports that he quit smoking about 42 years ago. His smoking use included cigarettes. He has a 20.00 pack-year smoking history. He has never used smokeless tobacco. He reports that he does not drink alcohol or use drugs. family history includes Cancer in his father; Diabetes in his brother; Other (age of onset: 53) in his mother. Allergies  Allergen Reactions  . Gemfibrozil     REACTION: unspecified  .  Lipitor [Atorvastatin]     Uneasy feeling  . Niacin     REACTION: unspecified  . Penicillins     REACTION: unspecified  . Statins Other (See Comments)    myalgias  . Valacyclovir Hcl     REACTION: unspecified   Current Outpatient Medications on File Prior to Visit  Medication Sig Dispense Refill  . aspirin EC 81 MG tablet Take 1 tablet (81 mg total) by mouth daily. 90 tablet 11  . clobetasol ointment (TEMOVATE) 0.05 %   0  . latanoprost (XALATAN) 0.005 % ophthalmic solution   0  . lisinopril (ZESTRIL) 20 MG tablet Take 1 tablet (20 mg total) by mouth daily. 90 tablet 0  . multivitamin (THERAGRAN) tablet Take 1 tablet by mouth daily.      . pantoprazole (PROTONIX) 40 MG tablet TAKE 1 TABLET(40 MG) BY MOUTH DAILY (Patient not taking: Reported on 02/22/2019) 90 tablet 1   No current facility-administered medications on file prior to visit.   Review of Systems All otherwise neg per pt     Objective:   Physical Exam BP 138/80 (BP Location: Left Arm, Patient Position: Sitting, Cuff Size: Normal)   Pulse 78   Temp 97.6 F (36.4 C) (Oral)   Ht 6' (1.829 m)   Wt 159 lb (72.1 kg)   SpO2 98%   BMI 21.56 kg/m  VS noted,  Constitutional: Pt appears in NAD HENT: Head: NCAT.  Right Ear: External  ear normal.  Left Ear: External ear normal.  Eyes: . Pupils are equal, round, and reactive to light. Conjunctivae and EOM are normal Nose: without d/c or deformity Neck: Neck supple. Gross normal ROM Cardiovascular: Normal rate and regular rhythm.   Pulmonary/Chest: Effort normal and breath sounds without rales or wheezing.  Abd:  Soft, NT, ND, + BS, no organomegaly, + RIH hernia - NT reducible Neurological: Pt is alert. At baseline orientation, motor grossly intact Skin: Skin is warm. No rashes, other new lesions, no LE edema Psychiatric: Pt behavior is normal without agitation  All otherwise neg per pt Lab Results  Component Value Date   WBC 3.8 (L) 12/30/2018   HGB 13.2 12/30/2018    HCT 40.0 12/30/2018   PLT 231.0 12/30/2018   GLUCOSE 90 12/30/2018   CHOL 208 (H) 09/28/2018   TRIG 161.0 (H) 09/28/2018   HDL 65.00 09/28/2018   LDLDIRECT 123.0 01/12/2017   LDLCALC 110 (H) 09/28/2018   ALT 25 02/22/2019   AST 19 02/22/2019   NA 141 12/30/2018   K 4.0 12/30/2018   CL 105 12/30/2018   CREATININE 1.05 12/30/2018   BUN 22 12/30/2018   CO2 29 12/30/2018   TSH 0.61 09/28/2018   PSA 3.77 09/28/2018   HGBA1C 5.7 12/30/2018      Assessment & Plan:

## 2019-05-23 NOTE — Assessment & Plan Note (Signed)
Asympt, for f/u psa 

## 2019-05-24 ENCOUNTER — Telehealth: Payer: Self-pay | Admitting: Internal Medicine

## 2019-05-24 LAB — URINALYSIS, ROUTINE W REFLEX MICROSCOPIC
Bilirubin Urine: NEGATIVE
Hgb urine dipstick: NEGATIVE
Ketones, ur: NEGATIVE
Leukocytes,Ua: NEGATIVE
Nitrite: NEGATIVE
RBC / HPF: NONE SEEN (ref 0–?)
Specific Gravity, Urine: 1.02 (ref 1.000–1.030)
Total Protein, Urine: NEGATIVE
Urine Glucose: NEGATIVE
Urobilinogen, UA: 0.2 (ref 0.0–1.0)
pH: 6.5 (ref 5.0–8.0)

## 2019-05-24 LAB — TSH: TSH: 0.76 u[IU]/mL (ref 0.35–4.50)

## 2019-05-24 LAB — PSA: PSA: 3.27 ng/mL (ref 0.10–4.00)

## 2019-05-24 NOTE — Telephone Encounter (Signed)
   Sister Jeani Hawking [phone]) calling to request a copy of AVS be mailed to patient.  She is concerned patient does not understand what he needs to do next. Address on file verified.

## 2019-05-25 NOTE — Telephone Encounter (Signed)
Called Mrs. Tamala Julian to verify her relation to pt. Mrs. Vickii Chafe is Mr Vandersloot sister which  he has ok'd for [place] to talk and release information to. Sister states she doesn't know what he did with the paper work, but when he got home he did not have nothing. Wife was concern because he was confuse about what Dr. Jenny Reichmann said at the visit. Inform pt sister will print off and leave at front desk. Did inform her about the new medication that was sent on 2/2, and also MD has nt review labs yet he will get a call w/results.Marland KitchenJohny Chess

## 2019-05-29 ENCOUNTER — Encounter: Payer: Self-pay | Admitting: Internal Medicine

## 2019-06-05 ENCOUNTER — Telehealth: Payer: Self-pay

## 2019-06-05 NOTE — Telephone Encounter (Deleted)
Error

## 2019-06-16 ENCOUNTER — Ambulatory Visit: Payer: Medicare HMO | Attending: Internal Medicine

## 2019-06-16 DIAGNOSIS — Z23 Encounter for immunization: Secondary | ICD-10-CM | POA: Insufficient documentation

## 2019-06-16 NOTE — Progress Notes (Signed)
   Covid-19 Vaccination Clinic  Name:  [name] Good    MRN: [id number] DOB: 18-Sep-1940  06/16/2019  [name] was observed post Covid-19 immunization for 15 minutes without incidence. He was provided with Vaccine Information Sheet and instruction to access the V-Safe system.   [name] was instructed to call 911 with any severe reactions post vaccine: Marland Kitchen Difficulty breathing  . Swelling of your face and throat  . A fast heartbeat  . A bad rash all over your body  . Dizziness and weakness    Immunizations Administered    Name Date Dose VIS Date Route   Pfizer COVID-19 Vaccine 06/16/2019  3:03 AM 0.3 mL 03/31/2019 Intramuscular   Manufacturer: [place]   Lot: [id number]   [place]: [id number]

## 2019-06-23 ENCOUNTER — Other Ambulatory Visit: Payer: Self-pay

## 2019-06-23 ENCOUNTER — Ambulatory Visit (INDEPENDENT_AMBULATORY_CARE_PROVIDER_SITE_OTHER): Payer: Medicare HMO | Admitting: Podiatry

## 2019-06-23 DIAGNOSIS — D689 Coagulation defect, unspecified: Secondary | ICD-10-CM | POA: Diagnosis not present

## 2019-06-23 DIAGNOSIS — M79674 Pain in right toe(s): Secondary | ICD-10-CM | POA: Diagnosis not present

## 2019-06-23 DIAGNOSIS — B351 Tinea unguium: Secondary | ICD-10-CM

## 2019-06-23 DIAGNOSIS — M79675 Pain in left toe(s): Secondary | ICD-10-CM | POA: Diagnosis not present

## 2019-06-26 ENCOUNTER — Encounter: Payer: Self-pay | Admitting: Podiatry

## 2019-06-26 NOTE — Progress Notes (Signed)
  Subjective:  Patient ID: [name] Good, male    DOB: 08-Jun-1940,  MRN: [id number]  Chief Complaint  Patient presents with  . Nail Problem    pt is here for a nail trim   79 y.o. male returns for the above complaint.  Patient presents with thickened elongated mycotic toenails.  Patient states that he is not able to cut it down himself/debrided down himself.  They are painful when ambulating.  Painful when applying pressure.  He would like to know if we could debrided down for him.  Patient is not a diabetic.  Objective:  There were no vitals filed for this visit. Podiatric Exam: Vascular: dorsalis pedis and posterior tibial pulses are palpable bilateral. Capillary return is immediate. Temperature gradient is WNL. Skin turgor WNL  Sensorium: Normal Semmes Weinstein monofilament test. Normal tactile sensation bilaterally. Nail Exam: Pt has thick disfigured discolored nails with subungual debris noted bilateral entire nail hallux through fifth toenails Ulcer Exam: There is no evidence of ulcer or pre-ulcerative changes or infection. Orthopedic Exam: Muscle tone and strength are WNL. No limitations in general ROM. No crepitus or effusions noted. HAV  B/L.  Hammer toes 2-5  B/L. Skin: No Porokeratosis. No infection or ulcers  Assessment & Plan:  Patient was evaluated and treated and all questions answered.  Onychomycosis with pain  -Nails palliatively debrided as below. -Educated on self-care  Procedure: Nail Debridement Rationale: pain  Type of Debridement: manual, sharp debridement. Instrumentation: Nail nipper, rotary burr. Number of Nails: 10  Procedures and Treatment: Consent by patient was obtained for treatment procedures. The patient understood the discussion of treatment and procedures well. All questions were answered thoroughly reviewed. Debridement of mycotic and hypertrophic toenails, 1 through 5 bilateral and clearing of subungual debris. No ulceration, no infection  noted.  Return Visit-Office Procedure: Patient instructed to return to the office for a follow up visit 3 months for continued evaluation and treatment.  Boneta Lucks, DPM    No follow-ups on file.

## 2019-06-29 ENCOUNTER — Ambulatory Visit (INDEPENDENT_AMBULATORY_CARE_PROVIDER_SITE_OTHER): Payer: Medicare HMO | Admitting: Internal Medicine

## 2019-06-29 ENCOUNTER — Encounter: Payer: Self-pay | Admitting: Internal Medicine

## 2019-06-29 DIAGNOSIS — R42 Dizziness and giddiness: Secondary | ICD-10-CM

## 2019-06-29 NOTE — Patient Instructions (Signed)
See notes

## 2019-06-29 NOTE — Progress Notes (Deleted)
Patient ID: [name] Good, male   DOB: 1941-03-25, 79 y.o.   MRN: [id number]  Virtual Visit via Video Note  I connected with [name] Good on 06/29/19 at  4:20 PM EST by a video enabled telemedicine application and verified that I am speaking with the correct person using two identifiers.  Location: Patient:  Provider: ***   I discussed the limitations of evaluation and management by telemedicine and the availability of in person appointments. The patient expressed understanding and agreed to proceed.  History of Present Illness:    Wt Readings from Last 3 Encounters:  05/23/19 159 lb (72.1 kg)  02/22/19 163 lb (73.9 kg)  12/30/18 160 lb (72.6 kg)    Observations/Objective:   Assessment and Plan:   Follow Up Instructions:    I discussed the assessment and treatment plan with the patient. The patient was provided an opportunity to ask questions and all were answered. The patient agreed with the plan and demonstrated an understanding of the instructions.   The patient was advised to call back or seek an in-person evaluation if the symptoms worsen or if the condition fails to improve as anticipated.    Cathlean Cower, MD

## 2019-06-29 NOTE — Assessment & Plan Note (Signed)
See notes

## 2019-06-29 NOTE — Progress Notes (Signed)
Patient ID: [name] Good, male   DOB: 1941-04-10, 79 y.o.   MRN: [id number]  Cumulative time during 7-day interval 12 min, there was not an associated office visit for this concern within a 7 day period.  Verbal consent for services obtained from patient prior to services given.  Names of all persons present for services: Cathlean Cower, MD, patient  Chief complaint: dizzy  History, background, results pertinent: \ Pt c/o episode lightheaded dizzy with getting OOB this am, has not occurred like this before, no syncope. Pt denies chest pain, increased sob or doe, wheezing, orthopnea, PND, increased LE swelling, palpitations.  Has not reduced po intake recently, drinks fluid enough he thinks.  Wt stable, good compliance with med, has not been checking BP at home but has been taking the ACE in the PM.   Past Medical History:  Diagnosis Date  . Balanitis    hx of  . Cataract   . Glaucoma   . Hx of adenomatous polyp of colon 03/09/2014  . Hyperlipidemia   . Hypertension   . Osteoarthritis    diffuse  . Stroke [facility])    pt states ? when had   No results found for this or any previous visit (from the past 48 hour(s)). Current Outpatient Medications on File Prior to Visit  Medication Sig Dispense Refill  . aspirin EC 81 MG tablet Take 1 tablet (81 mg total) by mouth daily. 90 tablet 11  . citalopram (CELEXA) 10 MG tablet Take 1 tablet (10 mg total) by mouth daily. 90 tablet 3  . clobetasol ointment (TEMOVATE) 0.05 %   0  . dorzolamide-timolol (COSOPT) 22.3-6.8 MG/ML ophthalmic solution     . latanoprost (XALATAN) 0.005 % ophthalmic solution   0  . lisinopril (ZESTRIL) 20 MG tablet Take 1 tablet (20 mg total) by mouth daily. 90 tablet 0  . multivitamin (THERAGRAN) tablet Take 1 tablet by mouth daily.      . pantoprazole (PROTONIX) 40 MG tablet TAKE 1 TABLET(40 MG) BY MOUTH DAILY 90 tablet 1   No current facility-administered medications on file prior to visit.   Lab Results  Component  Value Date   WBC 3.2 (L) 05/23/2019   HGB 13.4 05/23/2019   HCT 40.2 05/23/2019   PLT 209.0 05/23/2019   GLUCOSE 99 05/23/2019   CHOL 253 (H) 05/23/2019   TRIG 94.0 05/23/2019   HDL 77.80 05/23/2019   LDLDIRECT 123.0 01/12/2017   LDLCALC 156 (H) 05/23/2019   ALT 23 05/23/2019   AST 18 05/23/2019   NA 141 05/23/2019   K 4.0 05/23/2019   CL 102 05/23/2019   CREATININE 0.98 05/23/2019   BUN 20 05/23/2019   CO2 32 05/23/2019   TSH 0.76 05/23/2019   PSA 3.27 05/23/2019   HGBA1C 5.6 05/23/2019   A/P/next steps:   Dizzy - etiology unclear, pt continue adequate hydration, get up slowly in the AM, take the ACEI in the AM,  to f/u any worsening symptoms or concerns  Cathlean Cower MD

## 2019-07-05 ENCOUNTER — Other Ambulatory Visit: Payer: Self-pay | Admitting: Internal Medicine

## 2019-07-12 ENCOUNTER — Ambulatory Visit: Payer: Medicare HMO | Attending: Internal Medicine

## 2019-07-12 DIAGNOSIS — Z23 Encounter for immunization: Secondary | ICD-10-CM

## 2019-07-12 NOTE — Progress Notes (Signed)
   Covid-19 Vaccination Clinic  Name:  JERAMIHA SPANEL    MRN: [id number] DOB: [birth year]  07/12/2019  Mr. Kuhnke was observed post Covid-19 immunization for 15 minutes without incident. He was provided with Vaccine Information Sheet and instruction to access the V-Safe system.   Mr. Deets was instructed to call 911 with any severe reactions post vaccine: Marland Kitchen Difficulty breathing  . Swelling of face and throat  . A fast heartbeat  . A bad rash all over body  . Dizziness and weakness   Immunizations Administered    Name Date Dose VIS Date Route   Pfizer COVID-19 Vaccine 07/12/2019  2:29 PM 0.3 mL 03/31/2019 Intramuscular   Manufacturer: [place]   Lot: [id number]   [place]: [id number]

## 2019-09-25 ENCOUNTER — Ambulatory Visit: Payer: Medicare Other | Admitting: Podiatry

## 2019-11-06 ENCOUNTER — Other Ambulatory Visit: Payer: Self-pay

## 2019-11-06 ENCOUNTER — Ambulatory Visit (INDEPENDENT_AMBULATORY_CARE_PROVIDER_SITE_OTHER): Payer: Medicare HMO | Admitting: Internal Medicine

## 2019-11-06 ENCOUNTER — Encounter: Payer: Self-pay | Admitting: Internal Medicine

## 2019-11-06 VITALS — BP 120/78 | HR 76 | Temp 98.4°F | Ht 72.0 in | Wt 149.0 lb

## 2019-11-06 DIAGNOSIS — I10 Essential (primary) hypertension: Secondary | ICD-10-CM | POA: Diagnosis not present

## 2019-11-06 DIAGNOSIS — F32A Depression, unspecified: Secondary | ICD-10-CM

## 2019-11-06 DIAGNOSIS — F329 Major depressive disorder, single episode, unspecified: Secondary | ICD-10-CM

## 2019-11-06 DIAGNOSIS — R739 Hyperglycemia, unspecified: Secondary | ICD-10-CM | POA: Diagnosis not present

## 2019-11-06 DIAGNOSIS — R011 Cardiac murmur, unspecified: Secondary | ICD-10-CM

## 2019-11-06 DIAGNOSIS — R634 Abnormal weight loss: Secondary | ICD-10-CM | POA: Diagnosis not present

## 2019-11-06 DIAGNOSIS — K219 Gastro-esophageal reflux disease without esophagitis: Secondary | ICD-10-CM | POA: Diagnosis not present

## 2019-11-06 DIAGNOSIS — N3943 Post-void dribbling: Secondary | ICD-10-CM | POA: Diagnosis not present

## 2019-11-06 MED ORDER — PANTOPRAZOLE SODIUM 40 MG PO TBEC: 40.0000 mg | DELAYED_RELEASE_TABLET | Freq: Every day | ORAL | 3 refills | Status: DC

## 2019-11-06 NOTE — Assessment & Plan Note (Signed)
Continue to work on wife placement, and situational depression should improve

## 2019-11-06 NOTE — Progress Notes (Signed)
Subjective:    Patient ID: [name] Good, male    DOB: 28-Sep-1940, 79 y.o.   MRN: [id number]  HPI  Here to f/u; overall doing ok,  Pt denies chest pain, increasing sob or doe, wheezing, orthopnea, PND, increased LE swelling, palpitations, dizziness or syncope.  Pt denies new neurological symptoms such as new headache, or facial or extremity weakness or numbness.  Pt denies polydipsia, polyuria, or low sugar episode.  Pt states overall good compliance with meds, mostly trying to follow appropriate diet, with wt overall down with ongoing stress at home with wife with mental d/o and needing placement.  Taking ensure 1 can per day.  Does c/o worsening reflux, but no other abd pain, dysphagia, n/v, bowel change or blood. Denies worsening depressive symptoms, suicidal ideation, or panic; has ongoing anxiety, not increased recently, but the stress quite back recently due to wife illness. Denies urinary symptoms such as dysuria, frequency, urgency, flank pain, hematuria or n/v, fever, chills, but does have some post void dribbling getting worse. Past Medical History:  Diagnosis Date  . Balanitis    hx of  . Cataract   . Glaucoma   . Hx of adenomatous polyp of colon 03/09/2014  . Hyperlipidemia   . Hypertension   . Osteoarthritis    diffuse  . Stroke [facility])    pt states ? when had   Past Surgical History:  Procedure Laterality Date  . CATARACT EXTRACTION    . COLONOSCOPY  06-05-2003   hems only  . TONSILLECTOMY      reports that he quit smoking about 42 years ago. His smoking use included cigarettes. He has a 20.00 pack-year smoking history. He has never used smokeless tobacco. He reports that he does not drink alcohol and does not use drugs. family history includes Cancer in his father; Diabetes in his brother; Other (age of onset: 33) in his mother. Allergies  Allergen Reactions  . Gemfibrozil     REACTION: unspecified  . Lipitor [Atorvastatin]     Uneasy feeling  . Niacin      REACTION: unspecified  . Penicillins     REACTION: unspecified  . Statins Other (See Comments)    myalgias  . Valacyclovir Hcl     REACTION: unspecified   Current Outpatient Medications on File Prior to Visit  Medication Sig Dispense Refill  . citalopram (CELEXA) 10 MG tablet Take 1 tablet (10 mg total) by mouth daily. 90 tablet 3  . clobetasol ointment (TEMOVATE) 0.05 %   0  . dorzolamide-timolol (COSOPT) 22.3-6.8 MG/ML ophthalmic solution     . latanoprost (XALATAN) 0.005 % ophthalmic solution   0  . lisinopril (ZESTRIL) 20 MG tablet TAKE 1 TABLET(20 MG) BY MOUTH DAILY 90 tablet 1  . multivitamin (THERAGRAN) tablet Take 1 tablet by mouth daily.       No current facility-administered medications on file prior to visit.   Review of Systems All otherwise neg per pt    Objective:   Physical Exam BP 120/78 (BP Location: Left Arm, Patient Position: Sitting, Cuff Size: Large)   Pulse 76   Temp 98.4 F (36.9 C) (Oral)   Ht 6' (1.829 m)   Wt 149 lb (67.6 kg)   SpO2 99%   BMI 20.21 kg/m  VS noted,  Constitutional: Pt appears in NAD HENT: Head: NCAT.  Right Ear: External ear normal.  Left Ear: External ear normal.  Eyes: . Pupils are equal, round, and reactive to light. Conjunctivae and EOM  are normal Nose: without d/c or deformity Neck: Neck supple. Gross normal ROM Cardiovascular: Normal rate and regular rhythm.  Gr 2/6 syst murmur rusb- chronic Pulmonary/Chest: Effort normal and breath sounds without rales or wheezing.  Abd:  Soft, NT, ND, + BS, no organomegaly Neurological: Pt is alert. At baseline orientation, motor grossly intact Skin: Skin is warm. No rashes, other new lesions, no LE edema Psychiatric: Pt behavior is normal without agitation  All otherwise neg per pt Lab Results  Component Value Date   WBC 3.2 (L) 05/23/2019   HGB 13.4 05/23/2019   HCT 40.2 05/23/2019   PLT 209.0 05/23/2019   GLUCOSE 99 05/23/2019   CHOL 253 (H) 05/23/2019   TRIG 94.0 05/23/2019    HDL 77.80 05/23/2019   LDLDIRECT 123.0 01/12/2017   LDLCALC 156 (H) 05/23/2019   ALT 23 05/23/2019   AST 18 05/23/2019   NA 141 05/23/2019   K 4.0 05/23/2019   CL 102 05/23/2019   CREATININE 0.98 05/23/2019   BUN 20 05/23/2019   CO2 32 05/23/2019   TSH 0.76 05/23/2019   PSA 3.27 05/23/2019   HGBA1C 5.6 05/23/2019        Assessment & Plan:

## 2019-11-06 NOTE — Assessment & Plan Note (Signed)
stable overall by history and exam, recent data reviewed with pt, and pt to continue medical treatment as before,  to f/u any worsening symptoms or concerns  

## 2019-11-06 NOTE — Assessment & Plan Note (Signed)
New worsening, for protonix 40 qd °

## 2019-11-06 NOTE — Assessment & Plan Note (Signed)
[place] for increased ensure 1 can bid

## 2019-11-06 NOTE — Assessment & Plan Note (Signed)
Chronic stable,  to f/u any worsening symptoms or concerns 

## 2019-11-06 NOTE — Patient Instructions (Signed)
Please take all new medication as prescribed - the protonix for acid reflux  Ok to increase the Ensure to at least 2 cans per day  Please continue all other medications as before, and refills have been done if requested.  Please have the pharmacy call with any other refills you may need.  Please continue your efforts at being more active, low cholesterol diet, and weight control.  Please keep your appointments with your specialists as you may have planned  You will be contacted regarding the referral for: Urology  Please go to the LAB at the blood drawing area for the tests to be done - just the urine testing today  Please make an Appointment to return in 6 months, or sooner if needed

## 2019-11-06 NOTE — Assessment & Plan Note (Addendum)
Ok for ua and refer urology  I spent 41 minutes in preparing to see the patient by review of recent labs, imaging and procedures, obtaining and reviewing separately obtained history, communicating with the patient and family or caregiver, ordering medications, tests or procedures, and documenting clinical information in the EHR including the differential Dx, treatment, and any further evaluation and other management of urinary incontinence, htn, gerd, heart murmur, hyperglycemia, hld, wt loss depression

## 2019-11-07 LAB — URINALYSIS, ROUTINE W REFLEX MICROSCOPIC
Bacteria, UA: NONE SEEN /HPF
Bilirubin Urine: NEGATIVE
Glucose, UA: NEGATIVE
Hgb urine dipstick: NEGATIVE
Hyaline Cast: NONE SEEN /LPF
Ketones, ur: NEGATIVE
Nitrite: NEGATIVE
Protein, ur: NEGATIVE
RBC / HPF: NONE SEEN /HPF (ref 0–2)
Specific Gravity, Urine: 1.016 (ref 1.001–1.03)
Squamous Epithelial / HPF: NONE SEEN /HPF (ref <=5)
WBC, UA: NONE SEEN /HPF (ref 0–5)
pH: 6.5 (ref 5.0–8.0)

## 2019-11-07 LAB — URINE CULTURE

## 2019-11-08 ENCOUNTER — Encounter: Payer: Self-pay | Admitting: Internal Medicine

## 2019-11-20 ENCOUNTER — Ambulatory Visit: Payer: Medicare Other | Admitting: Podiatry

## 2019-11-24 ENCOUNTER — Other Ambulatory Visit: Payer: Self-pay

## 2019-11-24 ENCOUNTER — Ambulatory Visit (INDEPENDENT_AMBULATORY_CARE_PROVIDER_SITE_OTHER): Payer: Medicare HMO | Admitting: Podiatry

## 2019-11-24 DIAGNOSIS — M79674 Pain in right toe(s): Secondary | ICD-10-CM

## 2019-11-24 DIAGNOSIS — L853 Xerosis cutis: Secondary | ICD-10-CM

## 2019-11-24 DIAGNOSIS — B351 Tinea unguium: Secondary | ICD-10-CM

## 2019-11-24 DIAGNOSIS — D689 Coagulation defect, unspecified: Secondary | ICD-10-CM

## 2019-11-24 DIAGNOSIS — M79675 Pain in left toe(s): Secondary | ICD-10-CM | POA: Diagnosis not present

## 2019-11-26 ENCOUNTER — Encounter: Payer: Self-pay | Admitting: Podiatry

## 2019-11-26 NOTE — Progress Notes (Signed)
  Subjective:  Patient ID: [name] Good, male    DOB: [birth year],  MRN: [id number]  Chief Complaint  Patient presents with  . Nail Problem    pt is here for a nail trim   79 y.o. male returns for the above complaint.  Patient presents with thickened elongated mycotic toenails.  Patient states that he is not able to cut it down himself/debrided down himself.  They are painful when ambulating.  Painful when applying pressure.  He would like to know if we could debrided down for him.  Patient is not a diabetic.  Objective:  There were no vitals filed for this visit. Podiatric Exam: Vascular: dorsalis pedis and posterior tibial pulses are palpable bilateral. Capillary return is immediate. Temperature gradient is WNL. Skin turgor WNL  Sensorium: Normal Semmes Weinstein monofilament test. Normal tactile sensation bilaterally. Nail Exam: Pt has thick disfigured discolored nails with subungual debris noted bilateral entire nail hallux through fifth toenails Ulcer Exam: There is no evidence of ulcer or pre-ulcerative changes or infection. Orthopedic Exam: Muscle tone and strength are WNL. No limitations in general ROM. No crepitus or effusions noted. HAV  B/L.  Hammer toes 2-5  B/L. Skin: No Porokeratosis. No infection or ulcers.  Mild xerosis noted to both lower extremity.  Assessment & Plan:  Patient was evaluated and treated and all questions answered.  Xerosis to both lower extremity mild -I explained to the patient the etiology of xerosis and various treatment options were extensively discussed.  I explained to the patient the importance of maintaining moisturization of the skin with application of over-the-counter lotion such as Eucerin or Luciderm.  I have asked the patient to apply this twice a day.  If unable to resolve patient will benefit from prescription lotion.   Onychomycosis with pain  -Nails palliatively debrided as below. -Educated on self-care  Procedure: Nail  Debridement Rationale: pain  Type of Debridement: manual, sharp debridement. Instrumentation: Nail nipper, rotary burr. Number of Nails: 10  Procedures and Treatment: Consent by patient was obtained for treatment procedures. The patient understood the discussion of treatment and procedures well. All questions were answered thoroughly reviewed. Debridement of mycotic and hypertrophic toenails, 1 through 5 bilateral and clearing of subungual debris. No ulceration, no infection noted.  Return Visit-Office Procedure: Patient instructed to return to the office for a follow up visit 3 months for continued evaluation and treatment.  Boneta Lucks, DPM    No follow-ups on file.

## 2020-01-05 ENCOUNTER — Other Ambulatory Visit: Payer: Self-pay | Admitting: Internal Medicine

## 2020-01-05 NOTE — Telephone Encounter (Signed)
Please refill as per office routine med refill policy (all routine meds refilled for 3 mo or monthly per pt preference up to one year from last visit, then month to month grace period for 3 mo, then further med refills will have to be denied)  

## 2020-01-31 ENCOUNTER — Ambulatory Visit (INDEPENDENT_AMBULATORY_CARE_PROVIDER_SITE_OTHER): Payer: PRIVATE HEALTH INSURANCE | Admitting: Internal Medicine

## 2020-01-31 ENCOUNTER — Other Ambulatory Visit: Payer: Self-pay

## 2020-01-31 VITALS — BP 128/70 | HR 77 | Temp 98.6°F | Ht 72.0 in | Wt 145.0 lb

## 2020-01-31 DIAGNOSIS — E785 Hyperlipidemia, unspecified: Secondary | ICD-10-CM | POA: Diagnosis not present

## 2020-01-31 DIAGNOSIS — I10 Essential (primary) hypertension: Secondary | ICD-10-CM

## 2020-01-31 DIAGNOSIS — R739 Hyperglycemia, unspecified: Secondary | ICD-10-CM

## 2020-01-31 DIAGNOSIS — H9193 Unspecified hearing loss, bilateral: Secondary | ICD-10-CM

## 2020-01-31 DIAGNOSIS — K5900 Constipation, unspecified: Secondary | ICD-10-CM | POA: Diagnosis not present

## 2020-01-31 NOTE — Patient Instructions (Addendum)
Ok to take the OTC Miralax every day for constipation  Your ears were cleared of wax today  Please continue all other medications as before, and refills have been done if requested.  Please have the pharmacy call with any other refills you may need.  Please continue your efforts at being more active, low cholesterol diet  Please keep your appointments with your specialists as you may have planned  Please make an Appointment to return in 3 months

## 2020-01-31 NOTE — Progress Notes (Signed)
Subjective:    Patient ID: [name] Good, male    DOB: 10/28/1940, 78 y.o.   MRN: [id number]  HPI  Here to f/u; overall doing ok,  Pt denies chest pain, increasing sob or doe, wheezing, orthopnea, PND, increased LE swelling, palpitations, dizziness or syncope.  Pt denies new neurological symptoms such as new headache, or facial or extremity weakness or numbness.  Pt denies polydipsia, polyuria, or low sugar episode.  Pt states overall good compliance with meds, mostly trying to follow appropriate diet, with wt overall stable,  but little exercise however.  Does also have bilateral hearing loss without fever, pain, HA or d/c for several wks.  Denies worsening reflux, abd pain, dysphagia, n/v, bowel change or blood except for worsening constipatoin in the last few weeks. Past Medical History:  Diagnosis Date   Balanitis    hx of   Cataract    Glaucoma    Hx of adenomatous polyp of colon 03/09/2014   Hyperlipidemia    Hypertension    Osteoarthritis    diffuse   Stroke [facility])    pt states ? when had   Past Surgical History:  Procedure Laterality Date   CATARACT EXTRACTION     COLONOSCOPY  06-05-2003   hems only   TONSILLECTOMY      reports that he quit smoking about 42 years ago. His smoking use included cigarettes. He has a 20.00 pack-year smoking history. He has never used smokeless tobacco. He reports that he does not drink alcohol and does not use drugs. family history includes Cancer in his father; Diabetes in his brother; Other (age of onset: 4) in his mother. Allergies  Allergen Reactions   Gemfibrozil     REACTION: unspecified   Lipitor [Atorvastatin]     Uneasy feeling   Niacin     REACTION: unspecified   Penicillins     REACTION: unspecified   Statins Other (See Comments)    myalgias   Valacyclovir Hcl     REACTION: unspecified   Current Outpatient Medications on File Prior to Visit  Medication Sig Dispense Refill   citalopram (CELEXA) 10  MG tablet Take 1 tablet (10 mg total) by mouth daily. 90 tablet 3   clobetasol ointment (TEMOVATE) 0.05 %   0   dorzolamide-timolol (COSOPT) 22.3-6.8 MG/ML ophthalmic solution      latanoprost (XALATAN) 0.005 % ophthalmic solution   0   lisinopril (ZESTRIL) 20 MG tablet TAKE 1 TABLET(20 MG) BY MOUTH DAILY 90 tablet 1   multivitamin (THERAGRAN) tablet Take 1 tablet by mouth daily.       pantoprazole (PROTONIX) 40 MG tablet Take 1 tablet (40 mg total) by mouth daily. 90 tablet 3   No current facility-administered medications on file prior to visit.   Review of Systems All otherwise neg per pt    Objective:   Physical Exam BP 128/70 (BP Location: Left Arm, Patient Position: Sitting, Cuff Size: Large)    Pulse 77    Temp 98.6 F (37 C) (Oral)    Ht 6' (1.829 m)    Wt 145 lb (65.8 kg)    SpO2 (!) 9%    BMI 19.67 kg/m  VS noted,  Constitutional: Pt appears in NAD HENT: Head: NCAT.  Right Ear: External ear normal.  Left Ear: External ear normal.  Bilateral canals cleared of wax and hearing improved Eyes: . Pupils are equal, round, and reactive to light. Conjunctivae and EOM are normal Nose: without d/c or deformity Neck:  Neck supple. Gross normal ROM Cardiovascular: Normal rate and regular rhythm.   Pulmonary/Chest: Effort normal and breath sounds without rales or wheezing.  Abd:  Soft, NT, ND, + BS, no organomegaly Neurological: Pt is alert. At baseline orientation, motor grossly intact Skin: Skin is warm. No rashes, other new lesions, no LE edema Psychiatric: Pt behavior is normal without agitation  All otherwise neg per pt  Lab Results  Component Value Date   WBC 3.2 (L) 05/23/2019   HGB 13.4 05/23/2019   HCT 40.2 05/23/2019   PLT 209.0 05/23/2019   GLUCOSE 99 05/23/2019   CHOL 253 (H) 05/23/2019   TRIG 94.0 05/23/2019   HDL 77.80 05/23/2019   LDLDIRECT 123.0 01/12/2017   LDLCALC 156 (H) 05/23/2019   ALT 23 05/23/2019   AST 18 05/23/2019   NA 141 05/23/2019   K 4.0  05/23/2019   CL 102 05/23/2019   CREATININE 0.98 05/23/2019   BUN 20 05/23/2019   CO2 32 05/23/2019   TSH 0.76 05/23/2019   PSA 3.27 05/23/2019   HGBA1C 5.6 05/23/2019      Assessment & Plan:

## 2020-02-03 ENCOUNTER — Ambulatory Visit: Payer: PRIVATE HEALTH INSURANCE | Attending: Internal Medicine

## 2020-02-03 DIAGNOSIS — Z23 Encounter for immunization: Secondary | ICD-10-CM

## 2020-02-03 NOTE — Progress Notes (Signed)
   Covid-19 Vaccination Clinic  Name:  [name] Good    MRN: [id number] DOB: 18-Sep-1940  02/03/2020  [name] was observed post Covid-19 immunization for 15 minutes without incident. He was provided with Vaccine Information Sheet and instruction to access the V-Safe system.   [name] was instructed to call 911 with any severe reactions post vaccine: Marland Kitchen Difficulty breathing  . Swelling of face and throat  . A fast heartbeat  . A bad rash all over body  . Dizziness and weakness

## 2020-02-05 ENCOUNTER — Encounter: Payer: Self-pay | Admitting: Internal Medicine

## 2020-02-05 NOTE — Assessment & Plan Note (Signed)
With hearing improved with irrigation wax impactions,  to f/u any worsening symptoms or concerns

## 2020-02-05 NOTE — Assessment & Plan Note (Signed)
Ok for miralax prn

## 2020-02-05 NOTE — Assessment & Plan Note (Addendum)
stable overall by history and exam, recent data reviewed with pt, and pt to continue medical treatment as before,  to f/u any worsening symptoms or concerns  I spent 31 minutes in preparing to see the patient by review of recent labs, imaging and procedures, obtaining and reviewing separately obtained history, communicating with the patient and family or caregiver, ordering medications, tests or procedures, and documenting clinical information in the EHR including the differential Dx, treatment, and any further evaluation and other management of htn, bilat hearing loss, constipation, hld, hyperglycemia

## 2020-02-05 NOTE — Assessment & Plan Note (Signed)
Cont diet, declnes statin

## 2020-02-05 NOTE — Assessment & Plan Note (Signed)
stable overall by history and exam, recent data reviewed with pt, and pt to continue medical treatment as before,  to f/u any worsening symptoms or concerns  

## 2020-03-01 ENCOUNTER — Ambulatory Visit (INDEPENDENT_AMBULATORY_CARE_PROVIDER_SITE_OTHER): Payer: Medicare HMO | Admitting: Podiatry

## 2020-03-01 ENCOUNTER — Other Ambulatory Visit: Payer: Self-pay

## 2020-03-01 DIAGNOSIS — D689 Coagulation defect, unspecified: Secondary | ICD-10-CM

## 2020-03-01 DIAGNOSIS — M79674 Pain in right toe(s): Secondary | ICD-10-CM

## 2020-03-01 DIAGNOSIS — M79675 Pain in left toe(s): Secondary | ICD-10-CM

## 2020-03-01 DIAGNOSIS — B351 Tinea unguium: Secondary | ICD-10-CM | POA: Diagnosis not present

## 2020-03-05 ENCOUNTER — Encounter: Payer: Self-pay | Admitting: Podiatry

## 2020-03-05 NOTE — Progress Notes (Signed)
  Subjective:  Patient ID: [name] Good, male    DOB: 04/15/1941,  MRN: [id number]  Chief Complaint  Patient presents with  . routine foot care    nail trim    79 y.o. male returns for the above complaint.  Patient presents with thickened elongated mycotic toenails.  Patient states that he is not able to cut it down himself/debrided down himself.  They are painful when ambulating.  Painful when applying pressure.  He would like to know if we could debrided down for him.  Patient is not a diabetic.  Objective:  There were no vitals filed for this visit. Podiatric Exam: Vascular: dorsalis pedis and posterior tibial pulses are palpable bilateral. Capillary return is immediate. Temperature gradient is WNL. Skin turgor WNL  Sensorium: Normal Semmes Weinstein monofilament test. Normal tactile sensation bilaterally. Nail Exam: Pt has thick disfigured discolored nails with subungual debris noted bilateral entire nail hallux through fifth toenails Ulcer Exam: There is no evidence of ulcer or pre-ulcerative changes or infection. Orthopedic Exam: Muscle tone and strength are WNL. No limitations in general ROM. No crepitus or effusions noted. HAV  B/L.  Hammer toes 2-5  B/L. Skin: No Porokeratosis. No infection or ulcers.  Mild xerosis noted to both lower extremity.  Assessment & Plan:  Patient was evaluated and treated and all questions answered.  Xerosis to both lower extremity mild -I explained to the patient the etiology of xerosis and various treatment options were extensively discussed.  I explained to the patient the importance of maintaining moisturization of the skin with application of over-the-counter lotion such as Eucerin or Luciderm.  I have asked the patient to apply this twice a day.  If unable to resolve patient will benefit from prescription lotion.   Onychomycosis with pain  -Nails palliatively debrided as below. -Educated on self-care  Procedure: Nail  Debridement Rationale: pain  Type of Debridement: manual, sharp debridement. Instrumentation: Nail nipper, rotary burr. Number of Nails: 10  Procedures and Treatment: Consent by patient was obtained for treatment procedures. The patient understood the discussion of treatment and procedures well. All questions were answered thoroughly reviewed. Debridement of mycotic and hypertrophic toenails, 1 through 5 bilateral and clearing of subungual debris. No ulceration, no infection noted.  Return Visit-Office Procedure: Patient instructed to return to the office for a follow up visit 3 months for continued evaluation and treatment.  Boneta Lucks, DPM    No follow-ups on file.

## 2020-06-03 DIAGNOSIS — H401133 Primary open-angle glaucoma, bilateral, severe stage: Secondary | ICD-10-CM | POA: Diagnosis not present

## 2020-06-05 ENCOUNTER — Ambulatory Visit (INDEPENDENT_AMBULATORY_CARE_PROVIDER_SITE_OTHER): Payer: Medicare HMO | Admitting: Podiatry

## 2020-06-05 ENCOUNTER — Other Ambulatory Visit: Payer: Self-pay

## 2020-06-05 DIAGNOSIS — M79674 Pain in right toe(s): Secondary | ICD-10-CM | POA: Diagnosis not present

## 2020-06-05 DIAGNOSIS — B351 Tinea unguium: Secondary | ICD-10-CM

## 2020-06-05 DIAGNOSIS — M79675 Pain in left toe(s): Secondary | ICD-10-CM | POA: Diagnosis not present

## 2020-06-05 DIAGNOSIS — D689 Coagulation defect, unspecified: Secondary | ICD-10-CM | POA: Diagnosis not present

## 2020-06-06 ENCOUNTER — Encounter: Payer: Self-pay | Admitting: Podiatry

## 2020-06-06 NOTE — Progress Notes (Signed)
  Subjective:  Patient ID: [name] Good, male    DOB: July 30, 1940,  MRN: [id number]  Chief Complaint  Patient presents with  . routine foot care    Nail trim    80 y.o. male returns for the above complaint.  Patient presents with thickened elongated mycotic toenails.  Patient states that he is not able to cut it down himself/debrided down himself.  They are painful when ambulating.  Painful when applying pressure.  He would like to know if we could debrided down for him.  Patient is not a diabetic.  Objective:  There were no vitals filed for this visit. Podiatric Exam: Vascular: dorsalis pedis and posterior tibial pulses are palpable bilateral. Capillary return is immediate. Temperature gradient is WNL. Skin turgor WNL  Sensorium: Normal Semmes Weinstein monofilament test. Normal tactile sensation bilaterally. Nail Exam: Pt has thick disfigured discolored nails with subungual debris noted bilateral entire nail hallux through fifth toenails Ulcer Exam: There is no evidence of ulcer or pre-ulcerative changes or infection. Orthopedic Exam: Muscle tone and strength are WNL. No limitations in general ROM. No crepitus or effusions noted. HAV  B/L.  Hammer toes 2-5  B/L. Skin: No Porokeratosis. No infection or ulcers.  Mild xerosis noted to both lower extremity.  Assessment & Plan:  Patient was evaluated and treated and all questions answered.  Xerosis to both lower extremity mild -I explained to the patient the etiology of xerosis and various treatment options were extensively discussed.  I explained to the patient the importance of maintaining moisturization of the skin with application of over-the-counter lotion such as Eucerin or Luciderm.  I have asked the patient to apply this twice a day.  If unable to resolve patient will benefit from prescription lotion.   Onychomycosis with pain  -Nails palliatively debrided as below. -Educated on self-care  Procedure: Nail  Debridement Rationale: pain  Type of Debridement: manual, sharp debridement. Instrumentation: Nail nipper, rotary burr. Number of Nails: 10  Procedures and Treatment: Consent by patient was obtained for treatment procedures. The patient understood the discussion of treatment and procedures well. All questions were answered thoroughly reviewed. Debridement of mycotic and hypertrophic toenails, 1 through 5 bilateral and clearing of subungual debris. No ulceration, no infection noted.  Return Visit-Office Procedure: Patient instructed to return to the office for a follow up visit 3 months for continued evaluation and treatment.  Boneta Lucks, DPM    No follow-ups on file.

## 2020-06-19 ENCOUNTER — Telehealth: Payer: Self-pay

## 2020-06-19 NOTE — Telephone Encounter (Signed)
KeyJake Michaelis - PA Case ID: [id number]   Prior Auth for Pantoprazole

## 2020-07-11 ENCOUNTER — Other Ambulatory Visit: Payer: Self-pay | Admitting: Internal Medicine

## 2020-07-11 NOTE — Telephone Encounter (Signed)
Please refill as per office routine med refill policy (all routine meds refilled for 3 mo or monthly per pt preference up to one year from last visit, then month to month grace period for 3 mo, then further med refills will have to be denied)  

## 2020-09-04 ENCOUNTER — Ambulatory Visit (INDEPENDENT_AMBULATORY_CARE_PROVIDER_SITE_OTHER): Payer: Medicare HMO | Admitting: Podiatry

## 2020-09-04 ENCOUNTER — Encounter: Payer: Self-pay | Admitting: Podiatry

## 2020-09-04 ENCOUNTER — Other Ambulatory Visit: Payer: Self-pay

## 2020-09-04 DIAGNOSIS — M79675 Pain in left toe(s): Secondary | ICD-10-CM | POA: Diagnosis not present

## 2020-09-04 DIAGNOSIS — M79674 Pain in right toe(s): Secondary | ICD-10-CM

## 2020-09-04 DIAGNOSIS — B351 Tinea unguium: Secondary | ICD-10-CM

## 2020-09-04 DIAGNOSIS — D689 Coagulation defect, unspecified: Secondary | ICD-10-CM

## 2020-09-05 ENCOUNTER — Encounter: Payer: Self-pay | Admitting: Podiatry

## 2020-09-05 NOTE — Progress Notes (Signed)
  Subjective:  Patient ID: [name] Good, male    DOB: 01-Sep-1940,  MRN: [id number]  Chief Complaint  Patient presents with  . Nail Problem    Nail trim    80 y.o. male returns for the above complaint.  Patient presents with thickened elongated mycotic toenails.  Patient states that he is not able to cut it down himself/debrided down himself.  They are painful when ambulating.  Painful when applying pressure.  He would like to know if we could debrided down for him.  Patient is not a diabetic.  Objective:  There were no vitals filed for this visit. Podiatric Exam: Vascular: dorsalis pedis and posterior tibial pulses are palpable bilateral. Capillary return is immediate. Temperature gradient is WNL. Skin turgor WNL  Sensorium: Normal Semmes Weinstein monofilament test. Normal tactile sensation bilaterally. Nail Exam: Pt has thick disfigured discolored nails with subungual debris noted bilateral entire nail hallux through fifth toenails Ulcer Exam: There is no evidence of ulcer or pre-ulcerative changes or infection. Orthopedic Exam: Muscle tone and strength are WNL. No limitations in general ROM. No crepitus or effusions noted. HAV  B/L.  Hammer toes 2-5  B/L. Skin: No Porokeratosis. No infection or ulcers.  Mild xerosis noted to both lower extremity.  Assessment & Plan:  Patient was evaluated and treated and all questions answered.  Xerosis to both lower extremity mild -I explained to the patient the etiology of xerosis and various treatment options were extensively discussed.  I explained to the patient the importance of maintaining moisturization of the skin with application of over-the-counter lotion such as Eucerin or Luciderm.  I have asked the patient to apply this twice a day.  If unable to resolve patient will benefit from prescription lotion.   Onychomycosis with pain  -Nails palliatively debrided as below. -Educated on self-care  Procedure: Nail Debridement Rationale:  pain  Type of Debridement: manual, sharp debridement. Instrumentation: Nail nipper, rotary burr. Number of Nails: 10  Procedures and Treatment: Consent by patient was obtained for treatment procedures. The patient understood the discussion of treatment and procedures well. All questions were answered thoroughly reviewed. Debridement of mycotic and hypertrophic toenails, 1 through 5 bilateral and clearing of subungual debris. No ulceration, no infection noted.  Return Visit-Office Procedure: Patient instructed to return to the office for a follow up visit 3 months for continued evaluation and treatment.  Boneta Lucks, DPM    Return in about 3 months (around 12/05/2020).

## 2020-09-20 ENCOUNTER — Ambulatory Visit (INDEPENDENT_AMBULATORY_CARE_PROVIDER_SITE_OTHER): Payer: Medicare HMO | Admitting: Internal Medicine

## 2020-09-20 ENCOUNTER — Ambulatory Visit (INDEPENDENT_AMBULATORY_CARE_PROVIDER_SITE_OTHER): Payer: Medicare HMO

## 2020-09-20 ENCOUNTER — Other Ambulatory Visit: Payer: Self-pay

## 2020-09-20 VITALS — BP 140/78 | HR 73 | Ht 72.0 in | Wt 143.0 lb

## 2020-09-20 DIAGNOSIS — M353 Polymyalgia rheumatica: Secondary | ICD-10-CM | POA: Diagnosis not present

## 2020-09-20 DIAGNOSIS — E7800 Pure hypercholesterolemia, unspecified: Secondary | ICD-10-CM

## 2020-09-20 DIAGNOSIS — E538 Deficiency of other specified B group vitamins: Secondary | ICD-10-CM | POA: Diagnosis not present

## 2020-09-20 DIAGNOSIS — Z0001 Encounter for general adult medical examination with abnormal findings: Secondary | ICD-10-CM

## 2020-09-20 DIAGNOSIS — M1990 Unspecified osteoarthritis, unspecified site: Secondary | ICD-10-CM

## 2020-09-20 DIAGNOSIS — R634 Abnormal weight loss: Secondary | ICD-10-CM

## 2020-09-20 DIAGNOSIS — F32A Depression, unspecified: Secondary | ICD-10-CM

## 2020-09-20 DIAGNOSIS — I10 Essential (primary) hypertension: Secondary | ICD-10-CM

## 2020-09-20 DIAGNOSIS — L853 Xerosis cutis: Secondary | ICD-10-CM

## 2020-09-20 DIAGNOSIS — K219 Gastro-esophageal reflux disease without esophagitis: Secondary | ICD-10-CM | POA: Diagnosis not present

## 2020-09-20 DIAGNOSIS — E559 Vitamin D deficiency, unspecified: Secondary | ICD-10-CM

## 2020-09-20 LAB — URINALYSIS, ROUTINE W REFLEX MICROSCOPIC
Bilirubin Urine: NEGATIVE
Hgb urine dipstick: NEGATIVE
Leukocytes,Ua: NEGATIVE
Nitrite: NEGATIVE
Specific Gravity, Urine: 1.02 (ref 1.000–1.030)
Total Protein, Urine: NEGATIVE
Urine Glucose: NEGATIVE
Urobilinogen, UA: 0.2 (ref 0.0–1.0)
pH: 5.5 (ref 5.0–8.0)

## 2020-09-20 LAB — CBC WITH DIFFERENTIAL/PLATELET
Basophils Absolute: 0 10*3/uL (ref 0.0–0.1)
Basophils Relative: 1 % (ref 0.0–3.0)
Eosinophils Absolute: 0.1 10*3/uL (ref 0.0–0.7)
Eosinophils Relative: 2.7 % (ref 0.0–5.0)
HCT: 36.2 % — ABNORMAL LOW (ref 39.0–52.0)
Hemoglobin: 12.1 g/dL — ABNORMAL LOW (ref 13.0–17.0)
Lymphocytes Relative: 34.4 % (ref 12.0–46.0)
Lymphs Abs: 1.1 10*3/uL (ref 0.7–4.0)
MCHC: 33.3 g/dL (ref 30.0–36.0)
MCV: 89 fl (ref 78.0–100.0)
Monocytes Absolute: 0.5 10*3/uL (ref 0.1–1.0)
Monocytes Relative: 15.9 % — ABNORMAL HIGH (ref 3.0–12.0)
Neutro Abs: 1.4 10*3/uL (ref 1.4–7.7)
Neutrophils Relative %: 46 % (ref 43.0–77.0)
Platelets: 174 10*3/uL (ref 150.0–400.0)
RBC: 4.07 Mil/uL — ABNORMAL LOW (ref 4.22–5.81)
RDW: 12.5 % (ref 11.5–15.5)
WBC: 3.1 10*3/uL — ABNORMAL LOW (ref 4.0–10.5)

## 2020-09-20 LAB — SEDIMENTATION RATE: Sed Rate: 8 mm/hr (ref 0–20)

## 2020-09-20 LAB — TSH: TSH: 0.83 u[IU]/mL (ref 0.35–4.50)

## 2020-09-20 LAB — VITAMIN D 25 HYDROXY (VIT D DEFICIENCY, FRACTURES): VITD: 38.03 ng/mL (ref 30.00–100.00)

## 2020-09-20 LAB — VITAMIN B12: Vitamin B-12: 804 pg/mL (ref 211–911)

## 2020-09-20 MED ORDER — PANTOPRAZOLE SODIUM 40 MG PO TBEC: 40.0000 mg | DELAYED_RELEASE_TABLET | Freq: Every day | ORAL | 3 refills | Status: DC

## 2020-09-20 MED ORDER — LISINOPRIL 20 MG PO TABS: ORAL_TABLET | ORAL | 3 refills | Status: DC

## 2020-09-20 MED ORDER — CITALOPRAM HYDROBROMIDE 10 MG PO TABS: 10.0000 mg | ORAL_TABLET | Freq: Every day | ORAL | 3 refills | Status: DC

## 2020-09-20 NOTE — Patient Instructions (Signed)
Please continue all other medications as before, and refills have been done if requested.  Please have the pharmacy call with any other refills you may need.  Please continue your efforts at being more active, low cholesterol diet, and weight control.  You are otherwise up to date with prevention measures today.  Please keep your appointments with your specialists as you may have planned  You can also try OTC Voltaren gel topically for joint pain if needed  You can also try Eucerin Cream OTC for dry skin  Please go to the XRAY Department in the first floor for the x-ray testing  Please go to the LAB at the blood drawing area for the tests to be done  You will be contacted by phone if any changes need to be made immediately.  Otherwise, you will receive a letter about your results with an explanation, but please check with MyChart first.  Please remember to sign up for MyChart if you have not done so, as this will be important to you in the future with finding out test results, communicating by private email, and scheduling acute appointments online when needed.  Please make an Appointment to return in 3 months

## 2020-09-20 NOTE — Progress Notes (Signed)
Patient ID: [name] Good, male   DOB: 1940/05/25, 80 y.o.   MRN: [id number]         Chief Complaint:: wellness exam and Weight Loss  , hand djd, dry skin, and gerd       HPI:  [name] Good is a 80 y.o. male here for wellness exam; declines shingrix, o/w up to date with preventive referrals and immunizations                        Also c/o several labs wt loss since last yr, about 6 lbs by records, just doesn't have the appetite he used to.   Pt denies fever, night sweats, loss of appetite, or other constitutional symptoms  Also has mild worsening reflux after been out of his PPI, without abd pain, dysphagia, n/v, bowel change or blood.  Has bilateral hand joint soreness and discomfort mild worse in the past month.  Also has worsening diffuse dry skin mostly to the arms, wife wondering about eczema but no rash.  Pt denies chest pain, increased sob or doe, wheezing, orthopnea, PND, increased LE swelling, palpitations, dizziness or syncope.   Pt denies polydipsia, polyuria, or new focal neuro s/s.  No other new complaints  Denies worsening depressive symptoms, suicidal ideation, or panic   Wt Readings from Last 3 Encounters:  09/20/20 143 lb (64.9 kg)  01/31/20 145 lb (65.8 kg)  11/06/19 149 lb (67.6 kg)   BP Readings from Last 3 Encounters:  09/20/20 140/78  01/31/20 128/70  11/06/19 120/78   Immunization History  Administered Date(s) Administered  . Influenza Split 03/03/2011, 02/09/2012  . Influenza Whole 04/06/2007, 01/31/2008, 02/26/2009, 03/21/2010  . Influenza, High Dose Seasonal PF 02/14/2013, 01/09/2014, 01/12/2017, 01/27/2018  . Influenza,inj,Quad PF,6+ Mos 12/10/2015  . Influenza-Unspecified 01/05/2015  . PFIZER(Purple Top)SARS-COV-2 Vaccination 06/16/2019, 07/12/2019, 02/03/2020  . Pneumococcal Conjugate-13 11/29/2013  . Pneumococcal Polysaccharide-23 09/02/2009, 03/03/2011  . Td 12/19/1997, 02/26/2009  . Tdap 05/23/2019  . Zoster Recombinat (Shingrix)  08/28/2016  . Zoster, Live 11/28/2014   There are no preventive care reminders to display for this patient.    Past Medical History:  Diagnosis Date  . Balanitis    hx of  . Cataract   . Glaucoma   . Hx of adenomatous polyp of colon 03/09/2014  . Hyperlipidemia   . Hypertension   . Osteoarthritis    diffuse  . Stroke [facility])    pt states ? when had   Past Surgical History:  Procedure Laterality Date  . CATARACT EXTRACTION    . COLONOSCOPY  06-05-2003   hems only  . TONSILLECTOMY      reports that he quit smoking about 43 years ago. His smoking use included cigarettes. He has a 20.00 pack-year smoking history. He has never used smokeless tobacco. He reports that he does not drink alcohol and does not use drugs. family history includes Cancer in his father; Diabetes in his brother; Other (age of onset: 43) in his mother. Allergies  Allergen Reactions  . Gemfibrozil     REACTION: unspecified  . Lipitor [Atorvastatin]     Uneasy feeling  . Niacin     REACTION: unspecified  . Penicillins     REACTION: unspecified  . Statins Other (See Comments)    myalgias  . Valacyclovir Hcl     REACTION: unspecified   Current Outpatient Medications on File Prior to Visit  Medication Sig Dispense Refill  . clobetasol ointment (TEMOVATE) 0.05 %  0  . dorzolamide-timolol (COSOPT) 22.3-6.8 MG/ML ophthalmic solution     . latanoprost (XALATAN) 0.005 % ophthalmic solution   0  . multivitamin (THERAGRAN) tablet Take 1 tablet by mouth daily.     No current facility-administered medications on file prior to visit.        ROS:  All others reviewed and negative.  Objective        PE:  BP 140/78 (BP Location: Left Arm, Patient Position: Sitting, Cuff Size: Large)   Pulse 73   Ht 6' (1.829 m)   Wt 143 lb (64.9 kg)   SpO2 98%   BMI 19.39 kg/m                 Constitutional: Pt appears in NAD               HENT: Head: NCAT.                Right Ear: External ear normal.                  Left Ear: External ear normal.                Eyes: . Pupils are equal, round, and reactive to light. Conjunctivae and EOM are normal               Nose: without d/c or deformity               Neck: Neck supple. Gross normal ROM               Cardiovascular: Normal rate and regular rhythm.                 Pulmonary/Chest: Effort normal and breath sounds without rales or wheezing.                Abd:  Soft, NT, ND, + BS, no organomegaly               Neurological: Pt is alert. At baseline orientation, motor grossly intact               Skin: Skin is warm. No rashes, no other new lesions but with diffuse dry skin mostly to bilateral UEs; LE edema - none; bilateral hand OA changes noted               Psychiatric: Pt behavior is normal without agitation   Micro: none  Cardiac tracings I have personally interpreted today:  none  Pertinent Radiological findings (summarize): none   Lab Results  Component Value Date   WBC 3.2 (L) 05/23/2019   HGB 13.4 05/23/2019   HCT 40.2 05/23/2019   PLT 209.0 05/23/2019   GLUCOSE 99 05/23/2019   CHOL 253 (H) 05/23/2019   TRIG 94.0 05/23/2019   HDL 77.80 05/23/2019   LDLDIRECT 123.0 01/12/2017   LDLCALC 156 (H) 05/23/2019   ALT 23 05/23/2019   AST 18 05/23/2019   NA 141 05/23/2019   K 4.0 05/23/2019   CL 102 05/23/2019   CREATININE 0.98 05/23/2019   BUN 20 05/23/2019   CO2 32 05/23/2019   TSH 0.76 05/23/2019   PSA 3.27 05/23/2019   HGBA1C 5.6 05/23/2019   Assessment/Plan:  [name] Good is a 80 y.o. Black or African American [2] male with  has a past medical history of Balanitis, Cataract, Glaucoma, adenomatous polyp of colon (03/09/2014), Hyperlipidemia, Hypertension, Osteoarthritis, and Stroke ([place]).  Polymyalgia rheumatica (HCC) Asympt but with recent wt  loss, also for esr  Encounter for well adult exam with abnormal findings Age and sex appropriate education and counseling updated with regular exercise and diet Referrals for  preventative services - none needed Immunizations addressed - declines shingrix Smoking counseling  - none needed Evidence for depression or other mood disorder - none significant Most recent labs reviewed. I have personally reviewed and have noted: 1) the patient's medical and social history 2) The patient's current medications and supplements 3) The patient's height, weight, and BMI have been recorded in the chart   Weight loss I suspect geriatric decline, hx and exam benign, for cxr with hx of smoking remotely, and labs as ordered, also ensure 1 can bid  Depression Stable, declines worsening s/s or need for change in tx  GERD (gastroesophageal reflux disease) Mild worsening having run out of PPI - for PPI restart  Hyperlipidemia Lab Results  Component Value Date   LDLCALC 156 (H) 05/23/2019   Stable, pt to continue current low chol diet, declines statin for now as has been intolerant to several in past,  declines lipid clinic   Essential hypertension BP Readings from Last 3 Encounters:  09/20/20 140/78  01/31/20 128/70  11/06/19 120/78   Stable, pt to continue medical treatment zestril 20   Osteoarthritis Of hands primarily today - for volt gel prn,  to f/u any worsening symptoms or concerns  Dry skin Ok for eucerin cream prn  Followup: Return in about 3 months (around 12/21/2020).  Cathlean Cower, MD 09/21/2020 4:17 PM [facility]

## 2020-09-21 ENCOUNTER — Encounter: Payer: Self-pay | Admitting: Internal Medicine

## 2020-09-21 DIAGNOSIS — L853 Xerosis cutis: Secondary | ICD-10-CM | POA: Insufficient documentation

## 2020-09-21 NOTE — Assessment & Plan Note (Addendum)
Lab Results  Component Value Date   LDLCALC 156 (H) 05/23/2019   Stable, pt to continue current low chol diet, declines statin for now as has been intolerant to several in past,  declines lipid clinic

## 2020-09-21 NOTE — Assessment & Plan Note (Signed)
Stable, declines worsening s/s or need for change in tx

## 2020-09-21 NOTE — Assessment & Plan Note (Signed)
Mild worsening having run out of PPI - for PPI restart

## 2020-09-21 NOTE — Assessment & Plan Note (Signed)

## 2020-09-21 NOTE — Assessment & Plan Note (Signed)
BP Readings from Last 3 Encounters:  09/20/20 140/78  01/31/20 128/70  11/06/19 120/78   Stable, pt to continue medical treatment zestril 20

## 2020-09-21 NOTE — Assessment & Plan Note (Signed)
Of hands primarily today - for volt gel prn,  to f/u any worsening symptoms or concerns

## 2020-09-21 NOTE — Assessment & Plan Note (Signed)
I suspect geriatric decline, hx and exam benign, for cxr with hx of smoking remotely, and labs as ordered, also ensure 1 can bid

## 2020-09-21 NOTE — Assessment & Plan Note (Signed)
Asympt but with recent wt loss, also for esr

## 2020-09-21 NOTE — Assessment & Plan Note (Signed)
Ok for eucerin cream prn

## 2020-09-23 ENCOUNTER — Encounter: Payer: Self-pay | Admitting: Internal Medicine

## 2020-09-23 LAB — BASIC METABOLIC PANEL
BUN: 24 mg/dL — ABNORMAL HIGH (ref 6–23)
CO2: 29 mEq/L (ref 19–32)
Calcium: 9.2 mg/dL (ref 8.4–10.5)
Chloride: 104 mEq/L (ref 96–112)
Creatinine, Ser: 0.99 mg/dL (ref 0.40–1.50)
GFR: 71.91 mL/min (ref >60.00)
Glucose, Bld: 70 mg/dL (ref 70–99)
Potassium: 4.4 mEq/L (ref 3.5–5.1)
Sodium: 141 mEq/L (ref 135–145)

## 2020-09-23 LAB — HEPATIC FUNCTION PANEL
ALT: 33 U/L (ref 0–53)
AST: 30 U/L (ref 0–37)
Albumin: 4.3 g/dL (ref 3.5–5.2)
Alkaline Phosphatase: 61 U/L (ref 39–117)
Bilirubin, Direct: 0 mg/dL (ref 0.0–0.3)
Total Bilirubin: 0.4 mg/dL (ref 0.2–1.2)
Total Protein: 6.6 g/dL (ref 6.0–8.3)

## 2020-09-23 LAB — LIPID PANEL
Cholesterol: 208 mg/dL — ABNORMAL HIGH (ref 0–200)
HDL: 75.6 mg/dL (ref >39.00)
LDL Cholesterol: 120 mg/dL — ABNORMAL HIGH (ref 0–99)
NonHDL: 132.8
Total CHOL/HDL Ratio: 3
Triglycerides: 62 mg/dL (ref 0.0–149.0)
VLDL: 12.4 mg/dL (ref 0.0–40.0)

## 2020-10-28 ENCOUNTER — Telehealth: Payer: Self-pay | Admitting: Internal Medicine

## 2020-10-28 NOTE — Telephone Encounter (Signed)
LVM for pt to rtn my call to schedule AWV with [facility]. Please schedule AWV with [facility] if pt calls the office.

## 2020-12-11 ENCOUNTER — Ambulatory Visit: Payer: Medicare HMO | Admitting: Podiatry

## 2021-01-03 ENCOUNTER — Encounter: Payer: Self-pay | Admitting: Internal Medicine

## 2021-01-03 ENCOUNTER — Ambulatory Visit (INDEPENDENT_AMBULATORY_CARE_PROVIDER_SITE_OTHER): Payer: Medicare HMO | Admitting: Internal Medicine

## 2021-01-03 ENCOUNTER — Other Ambulatory Visit: Payer: Self-pay

## 2021-01-03 VITALS — BP 132/76 | HR 74 | Temp 98.2°F | Ht 72.0 in | Wt 143.0 lb

## 2021-01-03 DIAGNOSIS — R32 Unspecified urinary incontinence: Secondary | ICD-10-CM | POA: Diagnosis not present

## 2021-01-03 DIAGNOSIS — I10 Essential (primary) hypertension: Secondary | ICD-10-CM | POA: Diagnosis not present

## 2021-01-03 DIAGNOSIS — E7800 Pure hypercholesterolemia, unspecified: Secondary | ICD-10-CM | POA: Diagnosis not present

## 2021-01-03 DIAGNOSIS — R351 Nocturia: Secondary | ICD-10-CM

## 2021-01-03 DIAGNOSIS — R739 Hyperglycemia, unspecified: Secondary | ICD-10-CM

## 2021-01-03 LAB — URINALYSIS, ROUTINE W REFLEX MICROSCOPIC
Bilirubin Urine: NEGATIVE
Hgb urine dipstick: NEGATIVE
Ketones, ur: NEGATIVE
Leukocytes,Ua: NEGATIVE
Nitrite: NEGATIVE
Specific Gravity, Urine: 1.015 (ref 1.000–1.030)
Total Protein, Urine: NEGATIVE
Urine Glucose: NEGATIVE
Urobilinogen, UA: 0.2 (ref 0.0–1.0)
pH: 6 (ref 5.0–8.0)

## 2021-01-03 MED ORDER — SOLIFENACIN SUCCINATE 5 MG PO TABS: 5.0000 mg | ORAL_TABLET | Freq: Every day | ORAL | 3 refills | Status: DC

## 2021-01-03 NOTE — Progress Notes (Signed)
Patient ID: [name], male   DOB: 03/16/1941, 80 y.o.   MRN: [id number]        Chief Complaint: follow up HTN, HLD and hyperglycemia , urinary symptoms       HPI:  ROBERTLEE GOGUE is a 80 y.o. male here with c/o several months of increase urinary frequency, worse at night up to 4 times per night, and also with several occasions of incontinence associated with not being able to get to the BR quickly enough; Denies urinary symptoms such as dysuria, flank pain, hematuria or n/v, fever, chills.  No prior hx of this.  Has strong stream per pt without straining or retention.  Pt denies chest pain, increased sob or doe, wheezing, orthopnea, PND, increased LE swelling, palpitations, dizziness or syncope.   Pt denies polydipsia, polyuria, or new focal neuro s/s.  No other new complaints       Wt Readings from Last 3 Encounters:  01/03/21 143 lb (64.9 kg)  09/20/20 143 lb (64.9 kg)  01/31/20 145 lb (65.8 kg)   BP Readings from Last 3 Encounters:  01/03/21 132/76  09/20/20 140/78  01/31/20 128/70         Past Medical History:  Diagnosis Date   Balanitis    hx of   Cataract    Glaucoma    Hx of adenomatous polyp of colon 03/09/2014   Hyperlipidemia    Hypertension    Osteoarthritis    diffuse   Stroke [facility])    pt states ? when had   Past Surgical History:  Procedure Laterality Date   CATARACT EXTRACTION     COLONOSCOPY  06-05-2003   hems only   TONSILLECTOMY      reports that he quit smoking about 43 years ago. His smoking use included cigarettes. He has a 20.00 pack-year smoking history. He has never used smokeless tobacco. He reports that he does not drink alcohol and does not use drugs. family history includes Cancer in his father; Diabetes in his brother; Other (age of onset: 52) in his mother. Allergies  Allergen Reactions   Gemfibrozil     REACTION: unspecified   Lipitor [Atorvastatin]     Uneasy feeling   Niacin     REACTION: unspecified   Penicillins      REACTION: unspecified   Statins Other (See Comments)    myalgias   Valacyclovir Hcl     REACTION: unspecified   Current Outpatient Medications on File Prior to Visit  Medication Sig Dispense Refill   citalopram (CELEXA) 10 MG tablet Take 1 tablet (10 mg total) by mouth daily. 90 tablet 3   clobetasol ointment (TEMOVATE) 0.05 %   0   dorzolamide-timolol (COSOPT) 22.3-6.8 MG/ML ophthalmic solution      latanoprost (XALATAN) 0.005 % ophthalmic solution   0   lisinopril (ZESTRIL) 20 MG tablet TAKE 1 TABLET(20 MG) BY MOUTH DAILY 90 tablet 3   multivitamin (THERAGRAN) tablet Take 1 tablet by mouth daily.     pantoprazole (PROTONIX) 40 MG tablet Take 1 tablet (40 mg total) by mouth daily. 90 tablet 3   No current facility-administered medications on file prior to visit.        ROS:  All others reviewed and negative.  Objective        PE:  BP 132/76 (BP Location: Right Arm, Patient Position: Sitting, Cuff Size: Normal)   Pulse 74   Temp 98.2 F (36.8 C) (Oral)   Ht 6' (1.829 m)  Wt 143 lb (64.9 kg)   SpO2 98%   BMI 19.39 kg/m                 Constitutional: Pt appears in NAD               HENT: Head: NCAT.                Right Ear: External ear normal.                 Left Ear: External ear normal.                Eyes: . Pupils are equal, round, and reactive to light. Conjunctivae and EOM are normal               Nose: without d/c or deformity               Neck: Neck supple. Gross normal ROM               Cardiovascular: Normal rate and regular rhythm.                 Pulmonary/Chest: Effort normal and breath sounds without rales or wheezing.                Abd:  Soft, NT, ND, + BS, no organomegaly               Neurological: Pt is alert. At baseline orientation, motor grossly intact               Skin: Skin is warm. No rashes, no other new lesions, LE edema - none               Psychiatric: Pt behavior is normal without agitation   Micro: none  Cardiac tracings I have  personally interpreted today:  none  Pertinent Radiological findings (summarize): none   Lab Results  Component Value Date   WBC 3.1 (L) 09/20/2020   HGB 12.1 (L) 09/20/2020   HCT 36.2 (L) 09/20/2020   PLT 174.0 09/20/2020   GLUCOSE 70 09/20/2020   CHOL 208 (H) 09/20/2020   TRIG 62.0 09/20/2020   HDL 75.60 09/20/2020   LDLDIRECT 123.0 01/12/2017   LDLCALC 120 (H) 09/20/2020   ALT 33 09/20/2020   AST 30 09/20/2020   NA 141 09/20/2020   K 4.4 09/20/2020   CL 104 09/20/2020   CREATININE 0.99 09/20/2020   BUN 24 (H) 09/20/2020   CO2 29 09/20/2020   TSH 0.83 09/20/2020   PSA 3.27 05/23/2019   HGBA1C 5.6 05/23/2019   Assessment/Plan:  KORION NGO is a 80 y.o. Black or African American [2] male with  has a past medical history of Balanitis, Cataract, Glaucoma, adenomatous polyp of colon (03/09/2014), Hyperlipidemia, Hypertension, Osteoarthritis, and Stroke ([place]).  Nocturia more than twice per night Exam c/w probable OAB - for UA, also trial of vesicare 5 qd,  to f/u any worsening symptoms or concerns  Urinary incontinence Hopefully to improve with vesicare, but consider urology referral if persists or worsens  Essential hypertension BP Readings from Last 3 Encounters:  01/03/21 132/76  09/20/20 140/78  01/31/20 128/70   Stable, pt to continue medical treatment lisinopril   Hyperglycemia Lab Results  Component Value Date   HGBA1C 5.6 05/23/2019   Stable, pt to continue current medical treatment  - diet   Hyperlipidemia Lab Results  Component Value Date   LDLCALC 120 (H) 09/20/2020   uncontroled, pt  to continue low chol iet, declines statin for now  Followup: Return in about 6 months (around 07/03/2021).  Cathlean Cower, MD 01/11/2021 2:35 PM [facility]

## 2021-01-03 NOTE — Patient Instructions (Signed)
Please take all new medication as prescribed - the vesicare 5 mg per day  Please consider [place] online if the vesicare is too expensive with your insurance  Please continue all other medications as before, and refills have been done if requested.  Please have the pharmacy call with any other refills you may need.  Please continue your efforts at being more active, low cholesterol diet, and weight control.  Please keep your appointments with your specialists as you may have planned  Please go to the LAB at the blood drawing area for the tests to be done - the urine test only today

## 2021-01-04 LAB — URINE CULTURE

## 2021-01-11 ENCOUNTER — Encounter: Payer: Self-pay | Admitting: Internal Medicine

## 2021-01-11 DIAGNOSIS — R351 Nocturia: Secondary | ICD-10-CM | POA: Insufficient documentation

## 2021-01-11 DIAGNOSIS — R32 Unspecified urinary incontinence: Secondary | ICD-10-CM | POA: Insufficient documentation

## 2021-01-11 NOTE — Assessment & Plan Note (Signed)
Lab Results  Component Value Date   HGBA1C 5.6 05/23/2019   Stable, pt to continue current medical treatment  - diet

## 2021-01-11 NOTE — Assessment & Plan Note (Signed)
Hopefully to improve with vesicare, but consider urology referral if persists or worsens

## 2021-01-11 NOTE — Assessment & Plan Note (Signed)
BP Readings from Last 3 Encounters:  01/03/21 132/76  09/20/20 140/78  01/31/20 128/70   Stable, pt to continue medical treatment lisinopril

## 2021-01-11 NOTE — Assessment & Plan Note (Signed)
Exam c/w probable OAB - for UA, also trial of vesicare 5 qd,  to f/u any worsening symptoms or concerns

## 2021-01-11 NOTE — Assessment & Plan Note (Signed)
Lab Results  Component Value Date   LDLCALC 120 (H) 09/20/2020   uncontroled, pt to continue low chol iet, declines statin for now

## 2021-04-04 ENCOUNTER — Encounter: Payer: Self-pay | Admitting: Internal Medicine

## 2021-04-04 ENCOUNTER — Other Ambulatory Visit: Payer: Self-pay

## 2021-04-04 ENCOUNTER — Ambulatory Visit (INDEPENDENT_AMBULATORY_CARE_PROVIDER_SITE_OTHER): Payer: Medicare HMO

## 2021-04-04 ENCOUNTER — Ambulatory Visit (INDEPENDENT_AMBULATORY_CARE_PROVIDER_SITE_OTHER): Payer: Medicare HMO | Admitting: Internal Medicine

## 2021-04-04 VITALS — BP 146/76 | HR 65 | Temp 98.4°F

## 2021-04-04 DIAGNOSIS — R739 Hyperglycemia, unspecified: Secondary | ICD-10-CM | POA: Diagnosis not present

## 2021-04-04 DIAGNOSIS — I10 Essential (primary) hypertension: Secondary | ICD-10-CM | POA: Diagnosis not present

## 2021-04-04 DIAGNOSIS — R531 Weakness: Secondary | ICD-10-CM | POA: Diagnosis not present

## 2021-04-04 DIAGNOSIS — R051 Acute cough: Secondary | ICD-10-CM

## 2021-04-04 DIAGNOSIS — R059 Cough, unspecified: Secondary | ICD-10-CM | POA: Diagnosis not present

## 2021-04-04 DIAGNOSIS — R42 Dizziness and giddiness: Secondary | ICD-10-CM | POA: Diagnosis not present

## 2021-04-04 LAB — BASIC METABOLIC PANEL
BUN: 20 mg/dL (ref 6–23)
CO2: 30 mEq/L (ref 19–32)
Calcium: 9.4 mg/dL (ref 8.4–10.5)
Chloride: 104 mEq/L (ref 96–112)
Creatinine, Ser: 1.05 mg/dL (ref 0.40–1.50)
GFR: 66.76 mL/min (ref >60.00)
Glucose, Bld: 92 mg/dL (ref 70–99)
Potassium: 3.8 mEq/L (ref 3.5–5.1)
Sodium: 140 mEq/L (ref 135–145)

## 2021-04-04 LAB — CBC WITH DIFFERENTIAL/PLATELET
Basophils Absolute: 0 10*3/uL (ref 0.0–0.1)
Basophils Relative: 0.5 % (ref 0.0–3.0)
Eosinophils Absolute: 0.1 10*3/uL (ref 0.0–0.7)
Eosinophils Relative: 2.8 % (ref 0.0–5.0)
HCT: 37.3 % — ABNORMAL LOW (ref 39.0–52.0)
Hemoglobin: 12.3 g/dL — ABNORMAL LOW (ref 13.0–17.0)
Lymphocytes Relative: 22.8 % (ref 12.0–46.0)
Lymphs Abs: 0.8 10*3/uL (ref 0.7–4.0)
MCHC: 33.1 g/dL (ref 30.0–36.0)
MCV: 87.7 fl (ref 78.0–100.0)
Monocytes Absolute: 0.4 10*3/uL (ref 0.1–1.0)
Monocytes Relative: 11.4 % (ref 3.0–12.0)
Neutro Abs: 2.3 10*3/uL (ref 1.4–7.7)
Neutrophils Relative %: 62.5 % (ref 43.0–77.0)
Platelets: 184 10*3/uL (ref 150.0–400.0)
RBC: 4.25 Mil/uL (ref 4.22–5.81)
RDW: 12.7 % (ref 11.5–15.5)
WBC: 3.7 10*3/uL — ABNORMAL LOW (ref 4.0–10.5)

## 2021-04-04 LAB — HEPATIC FUNCTION PANEL
ALT: 14 U/L (ref 0–53)
AST: 18 U/L (ref 0–37)
Albumin: 4.1 g/dL (ref 3.5–5.2)
Alkaline Phosphatase: 67 U/L (ref 39–117)
Bilirubin, Direct: 0.1 mg/dL (ref 0.0–0.3)
Total Bilirubin: 0.7 mg/dL (ref 0.2–1.2)
Total Protein: 6.8 g/dL (ref 6.0–8.3)

## 2021-04-04 LAB — URINALYSIS, ROUTINE W REFLEX MICROSCOPIC
Bilirubin Urine: NEGATIVE
Hgb urine dipstick: NEGATIVE
Ketones, ur: NEGATIVE
Leukocytes,Ua: NEGATIVE
Nitrite: NEGATIVE
Specific Gravity, Urine: 1.015 (ref 1.000–1.030)
Total Protein, Urine: NEGATIVE
Urine Glucose: NEGATIVE
Urobilinogen, UA: 0.2 (ref 0.0–1.0)
pH: 7.5 (ref 5.0–8.0)

## 2021-04-04 LAB — HEMOGLOBIN A1C: Hgb A1c MFr Bld: 5.7 % (ref 4.6–6.5)

## 2021-04-04 MED ORDER — BENZONATATE 100 MG PO CAPS: ORAL_CAPSULE | ORAL | 0 refills | Status: DC

## 2021-04-04 MED ORDER — CEPHALEXIN 500 MG PO CAPS: 500.0000 mg | ORAL_CAPSULE | Freq: Three times a day (TID) | ORAL | 0 refills | Status: DC

## 2021-04-04 NOTE — Progress Notes (Signed)
Patient ID: [name] Good, male   DOB: [birth year], 80 y.o.   MRN: [id number]        Chief Complaint: follow up generalized weakness, cough, hyperglycemia, htn       HPI:  [name] Good is a 80 y.o. male here with concerned family - sister and brother who are not sure of details as they do not live with him and pt is poor historian; here with 5 days onset non prod cough but Pt denies chest pain, increased sob or doe, wheezing, orthopnea, PND, increased LE swelling, palpitations, or syncope. But does have intermittent dizziness sometimes on standing. Trying to drink more fluids and has recent onset mild pedal swelling in the past wk.   Pt denies fever, wt loss, night sweats, loss of appetite, or other constitutional symptoms  Did have a fall x 1 mo ago where sister who checks on him daily went to home when no answer on the phone, pt found on his back lying on the floor at the foot of the bed but could not recall how he got there or how long he was on the floor; declined ED visit or f/u here until today.   Pt denies polydipsia, polyuria.    Wt Readings from Last 3 Encounters:  01/03/21 143 lb (64.9 kg)  09/20/20 143 lb (64.9 kg)  01/31/20 145 lb (65.8 kg)   BP Readings from Last 3 Encounters:  04/04/21 (!) 146/76  01/03/21 132/76  09/20/20 140/78         Past Medical History:  Diagnosis Date   Balanitis    hx of   Cataract    Glaucoma    Hx of adenomatous polyp of colon 03/09/2014   Hyperlipidemia    Hypertension    Osteoarthritis    diffuse   Stroke [facility])    pt states ? when had   Past Surgical History:  Procedure Laterality Date   CATARACT EXTRACTION     COLONOSCOPY  06-05-2003   hems only   TONSILLECTOMY      reports that he quit smoking about 43 years ago. His smoking use included cigarettes. He has a 20.00 pack-year smoking history. He has never used smokeless tobacco. He reports that he does not drink alcohol and does not use drugs. family history includes  Cancer in his father; Diabetes in his brother; Other (age of onset: 69) in his mother. Allergies  Allergen Reactions   Gemfibrozil     REACTION: unspecified   Lipitor [Atorvastatin]     Uneasy feeling   Niacin     REACTION: unspecified   Penicillins     REACTION: unspecified   Statins Other (See Comments)    myalgias   Valacyclovir Hcl     REACTION: unspecified   Current Outpatient Medications on File Prior to Visit  Medication Sig Dispense Refill   dorzolamide-timolol (COSOPT) 22.3-6.8 MG/ML ophthalmic solution      latanoprost (XALATAN) 0.005 % ophthalmic solution   0   lisinopril (ZESTRIL) 20 MG tablet TAKE 1 TABLET(20 MG) BY MOUTH DAILY 90 tablet 3   multivitamin (THERAGRAN) tablet Take 1 tablet by mouth daily.     pantoprazole (PROTONIX) 40 MG tablet Take 1 tablet (40 mg total) by mouth daily. 90 tablet 3   solifenacin (VESICARE) 5 MG tablet Take 1 tablet (5 mg total) by mouth daily. 90 tablet 3   citalopram (CELEXA) 10 MG tablet Take 1 tablet (10 mg total) by mouth daily. (Patient not taking: Reported on  04/04/2021) 90 tablet 3   No current facility-administered medications on file prior to visit.        ROS:  All others reviewed and negative.  Objective        PE:  BP (!) 146/76 (BP Location: Left Arm)    Pulse 65    Temp 98.4 F (36.9 C) (Oral)    SpO2 98%                 Constitutional: Pt appears in NAD but generally weak, does not want to try to get up on exam table               HENT: Head: NCAT.                Right Ear: External ear normal.                 Left Ear: External ear normal.                Eyes: . Pupils are equal, round, and reactive to light. Conjunctivae and EOM are normal               Nose: without d/c or deformity               Neck: Neck supple. Gross normal ROM               Cardiovascular: Normal rate and regular rhythm.                 Pulmonary/Chest: Effort normal and breath sounds without rales or wheezing.                Abd:  Soft,  NT, ND, + BS, no organomegaly               Neurological: Pt is alert. At baseline orientation, motor grossly intact               Skin: Skin is warm. No rashes, no other new lesions, LE edema - trace pedal edema right > left               Psychiatric: Pt behavior is normal without agitation   Micro: none  Cardiac tracings I have personally interpreted today:  none  Pertinent Radiological findings (summarize): none   Lab Results  Component Value Date   WBC 3.7 (L) 04/04/2021   HGB 12.3 (L) 04/04/2021   HCT 37.3 (L) 04/04/2021   PLT 184.0 04/04/2021   GLUCOSE 92 04/04/2021   CHOL 208 (H) 09/20/2020   TRIG 62.0 09/20/2020   HDL 75.60 09/20/2020   LDLDIRECT 123.0 01/12/2017   LDLCALC 120 (H) 09/20/2020   ALT 14 04/04/2021   AST 18 04/04/2021   NA 140 04/04/2021   K 3.8 04/04/2021   CL 104 04/04/2021   CREATININE 1.05 04/04/2021   BUN 20 04/04/2021   CO2 30 04/04/2021   TSH 0.83 09/20/2020   PSA 3.27 05/23/2019   HGBA1C 5.7 04/04/2021   Assessment/Plan:  [name] Good is a 80 y.o. Black or African American [2] male with  has a past medical history of Balanitis, Cataract, Glaucoma, adenomatous polyp of colon (03/09/2014), Hyperlipidemia, Hypertension, Osteoarthritis, and Stroke ([place]).  Essential hypertension BP Readings from Last 3 Encounters:  04/04/21 (!) 146/76  01/03/21 132/76  09/20/20 140/78   Mild uncontrolled, pt to continue medical treatment zestril as declines change   Hyperglycemia Lab Results  Component Value Date   HGBA1C 5.7 04/04/2021  Stable, pt to continue current medical treatment  - diet   Dizziness Pt encouraged to cont efforts at hydration, for labs including cbc today  Generalized weakness Etiology unclear except for possible geriatric decline and lack of ongoing activity - declines PT for now but will call if changes his mind  Cough Exam benign, etiology unclear, no wt loss or red flags, non smoker but for cxr today, and empiric  antibiotic, cough med given recent hx,  to f/u any worsening symptoms or concerns  Followup: No follow-ups on file.  Cathlean Cower, MD 04/05/2021 4:29 PM [facility]

## 2021-04-04 NOTE — Patient Instructions (Signed)
Please take all new medication as prescribed - the antibiotic, and cough medication as needed  Please continue all other medications as before, and refills have been done if requested.  Please have the pharmacy call with any other refills you may need.  Please keep your appointments with your specialists as you may have planned  Please go to the XRAY Department in the first floor for the x-ray testing  Please go to the LAB at the blood drawing area for the tests to be done  You will be contacted by phone if any changes need to be made immediately.  Otherwise, you will receive a letter about your results with an explanation, but please check with MyChart first.  Please remember to sign up for MyChart if you have not done so, as this will be important to you in the future with finding out test results, communicating by private email, and scheduling acute appointments online when needed.  Please make an Appointment to return in 1 month or sooner if needed

## 2021-04-05 ENCOUNTER — Encounter: Payer: Self-pay | Admitting: Internal Medicine

## 2021-04-05 NOTE — Assessment & Plan Note (Addendum)
Exam benign, etiology unclear, no wt loss or red flags, non smoker but for cxr today, and empiric antibiotic, cough med given recent hx,  to f/u any worsening symptoms or concerns

## 2021-04-05 NOTE — Assessment & Plan Note (Signed)
Pt encouraged to cont efforts at hydration, for labs including cbc today

## 2021-04-05 NOTE — Assessment & Plan Note (Signed)
Lab Results  Component Value Date   HGBA1C 5.7 04/04/2021   Stable, pt to continue current medical treatment  - diet

## 2021-04-05 NOTE — Assessment & Plan Note (Signed)
BP Readings from Last 3 Encounters:  04/04/21 (!) 146/76  01/03/21 132/76  09/20/20 140/78   Mild uncontrolled, pt to continue medical treatment zestril as declines change

## 2021-04-05 NOTE — Assessment & Plan Note (Signed)
Etiology unclear except for possible geriatric decline and lack of ongoing activity - declines PT for now but will call if changes his mind

## 2021-04-06 ENCOUNTER — Encounter: Payer: Self-pay | Admitting: Internal Medicine

## 2021-05-20 ENCOUNTER — Telehealth: Payer: Self-pay | Admitting: Internal Medicine

## 2021-05-29 NOTE — Telephone Encounter (Signed)
Unable to reach patient or leave a voicemail. Patient needs an appointment for this or to go to the ER.

## 2021-05-29 NOTE — Telephone Encounter (Signed)
1.Medication Requested: benzonatate (TESSALON PERLES) 100 MG capsule 2. Pharmacy (Name, Street, [place]): [facility] - [address], [address] AT [address] Phone:  [phone]  Fax:  [phone]     3. On Med List: y  77. Last Visit with PCP:  5. Next visit date with PCP:  Pt out of medication. Pt spouse states pt is coughing up blood, which may come from not having medication.   Agent: Please be advised that RX refills may take up to 3 business days. We ask that you follow-up with your pharmacy.

## 2021-05-30 ENCOUNTER — Telehealth: Payer: Self-pay

## 2021-05-30 MED ORDER — BENZONATATE 100 MG PO CAPS: ORAL_CAPSULE | ORAL | 2 refills | Status: DC

## 2021-05-30 NOTE — Telephone Encounter (Signed)
Pt is sched on 06/03/2021 to be seen by PCP.  Pt is experiencing genital or rectal bleeding. **Pt is also needing cough medication refilled.  **Pt is also needing HH 5 days a week for about 4 hours a day to help him with ADL's, meal prep, food shopping, etc.

## 2021-06-03 ENCOUNTER — Ambulatory Visit (INDEPENDENT_AMBULATORY_CARE_PROVIDER_SITE_OTHER): Payer: Medicare HMO | Admitting: Internal Medicine

## 2021-06-03 ENCOUNTER — Other Ambulatory Visit: Payer: Self-pay

## 2021-06-03 ENCOUNTER — Ambulatory Visit (INDEPENDENT_AMBULATORY_CARE_PROVIDER_SITE_OTHER): Payer: Medicare HMO

## 2021-06-03 VITALS — BP 110/60 | HR 88 | Temp 97.9°F | Ht 72.0 in | Wt 145.5 lb

## 2021-06-03 DIAGNOSIS — R972 Elevated prostate specific antigen [PSA]: Secondary | ICD-10-CM

## 2021-06-03 DIAGNOSIS — R531 Weakness: Secondary | ICD-10-CM

## 2021-06-03 DIAGNOSIS — R739 Hyperglycemia, unspecified: Secondary | ICD-10-CM

## 2021-06-03 DIAGNOSIS — I10 Essential (primary) hypertension: Secondary | ICD-10-CM

## 2021-06-03 DIAGNOSIS — Z0001 Encounter for general adult medical examination with abnormal findings: Secondary | ICD-10-CM | POA: Diagnosis not present

## 2021-06-03 DIAGNOSIS — Z8673 Personal history of transient ischemic attack (TIA), and cerebral infarction without residual deficits: Secondary | ICD-10-CM

## 2021-06-03 DIAGNOSIS — R7989 Other specified abnormal findings of blood chemistry: Secondary | ICD-10-CM

## 2021-06-03 DIAGNOSIS — R58 Hemorrhage, not elsewhere classified: Secondary | ICD-10-CM

## 2021-06-03 DIAGNOSIS — M353 Polymyalgia rheumatica: Secondary | ICD-10-CM | POA: Diagnosis not present

## 2021-06-03 DIAGNOSIS — E538 Deficiency of other specified B group vitamins: Secondary | ICD-10-CM | POA: Diagnosis not present

## 2021-06-03 LAB — CBC WITH DIFFERENTIAL/PLATELET
Basophils Absolute: 0 10*3/uL (ref 0.0–0.1)
Basophils Relative: 1.2 % (ref 0.0–3.0)
Eosinophils Absolute: 0.1 10*3/uL (ref 0.0–0.7)
Eosinophils Relative: 3.8 % (ref 0.0–5.0)
HCT: 37.6 % — ABNORMAL LOW (ref 39.0–52.0)
Hemoglobin: 12.2 g/dL — ABNORMAL LOW (ref 13.0–17.0)
Lymphocytes Relative: 32.8 % (ref 12.0–46.0)
Lymphs Abs: 1 10*3/uL (ref 0.7–4.0)
MCHC: 32.5 g/dL (ref 30.0–36.0)
MCV: 88 fl (ref 78.0–100.0)
Monocytes Absolute: 0.5 10*3/uL (ref 0.1–1.0)
Monocytes Relative: 15.6 % — ABNORMAL HIGH (ref 3.0–12.0)
Neutro Abs: 1.4 10*3/uL (ref 1.4–7.7)
Neutrophils Relative %: 46.6 % (ref 43.0–77.0)
Platelets: 190 10*3/uL (ref 150.0–400.0)
RBC: 4.27 Mil/uL (ref 4.22–5.81)
RDW: 12.4 % (ref 11.5–15.5)
WBC: 2.9 10*3/uL — ABNORMAL LOW (ref 4.0–10.5)

## 2021-06-03 LAB — FERRITIN: Ferritin: 125.1 ng/mL (ref 22.0–322.0)

## 2021-06-03 LAB — SEDIMENTATION RATE: Sed Rate: 26 mm/hr — ABNORMAL HIGH (ref 0–20)

## 2021-06-03 LAB — TSH: TSH: 0.96 u[IU]/mL (ref 0.35–5.50)

## 2021-06-03 LAB — PROTIME-INR
INR: 1.1 ratio — ABNORMAL HIGH (ref 0.8–1.0)
Prothrombin Time: 11.9 s (ref 9.6–13.1)

## 2021-06-03 LAB — PSA: PSA: 3.83 ng/mL (ref 0.10–4.00)

## 2021-06-03 LAB — VITAMIN D 25 HYDROXY (VIT D DEFICIENCY, FRACTURES): VITD: 45.65 ng/mL (ref 30.00–100.00)

## 2021-06-03 LAB — VITAMIN B12: Vitamin B-12: 1190 pg/mL — ABNORMAL HIGH (ref 211–911)

## 2021-06-03 LAB — HEMOGLOBIN A1C: Hgb A1c MFr Bld: 5.7 % (ref 4.6–6.5)

## 2021-06-03 NOTE — Patient Instructions (Addendum)
Please continue all other medications as before, and refills have been done if requested.  Please have the pharmacy call with any other refills you may need.  Please continue your efforts at being more active, low cholesterol diet, and weight control.  You are otherwise up to date with prevention measures today.  Please keep your appointments with your specialists as you may have planned  You will be contacted regarding the referral for: Home Health  Please go to the XRAY Department in the first floor for the x-ray testing  Please go to the LAB at the blood drawing area for the tests to be done  You will be contacted by phone if any changes need to be made immediately.  Otherwise, you will receive a letter about your results with an explanation, but please check with MyChart first.  Please remember to sign up for MyChart if you have not done so, as this will be important to you in the future with finding out test results, communicating by private email, and scheduling acute appointments online when needed.  Please make an Appointment to return in 3 months, or sooner if needed, also with Lab Appointment for testing done 3-5 days before at the [facility] (so this is for TWO appointments - please see the scheduling desk as you leave)  Due to the ongoing Covid 19 pandemic, our lab now requires an appointment for any labs done at our office.  If you need labs done and do not have an appointment, please call our office ahead of time to schedule before presenting to the lab for your testing.

## 2021-06-03 NOTE — Progress Notes (Signed)
Patient ID: [name] Good, male   DOB: March 31, 1941, 81 y.o.   MRN: [id number]         Chief Complaint:: wellness exam and Follow-up (Possible rectal or genital bleeding )  , drebility, wt loss, ? Rectal bleeding       HPI:  [name] Good is a 81 y.o. male here for wellness exam; decliens covid boostser, flu shot, shingrix, o/w up to date                        Also here with wife who menitons finding a small area of BRB on the floor 1 wk ago then later found BRB in the underwear but not clear if rectal or urological as sometimes he wears underwear backwards.  Denies worsening reflux, abd pain, dysphagia, n/v, bowel change or blood.  Pt denies chest pain, increased sob or doe, wheezing, orthopnea, PND, increased LE swelling, palpitations, dizziness or syncope.  But does have worsening generalized weakness and frailty it seems in the past 2-3 months.   Pt denies polydipsia, polyuria, or new focal neuro s/s.   Pt denies fever, wt loss, night sweats, loss of appetite, or other constitutional symptoms      Wt Readings from Last 3 Encounters:  06/03/21 145 lb 8 oz (66 kg)  01/03/21 143 lb (64.9 kg)  09/20/20 143 lb (64.9 kg)   BP Readings from Last 3 Encounters:  06/03/21 110/60  04/04/21 (!) 146/76  01/03/21 132/76   Immunization History  Administered Date(s) Administered   Influenza Split 03/03/2011, 02/09/2012   Influenza Whole 04/06/2007, 01/31/2008, 02/26/2009, 03/21/2010   Influenza, High Dose Seasonal PF 02/14/2013, 01/09/2014, 01/12/2017, 01/27/2018   Influenza,inj,Quad PF,6+ Mos 12/10/2015   Influenza-Unspecified 01/05/2015   PFIZER(Purple Top)SARS-COV-2 Vaccination 06/16/2019, 07/12/2019, 02/03/2020   Pneumococcal Conjugate-13 11/29/2013   Pneumococcal Polysaccharide-23 09/02/2009, 03/03/2011   Td 12/19/1997, 02/26/2009   Tdap 05/23/2019   Zoster Recombinat (Shingrix) 08/28/2016   Zoster, Live 11/28/2014  There are no preventive care reminders to display for this  patient.    Past Medical History:  Diagnosis Date   Balanitis    hx of   Cataract    Glaucoma    Hx of adenomatous polyp of colon 03/09/2014   Hyperlipidemia    Hypertension    Osteoarthritis    diffuse   Stroke [facility])    pt states ? when had   Past Surgical History:  Procedure Laterality Date   CATARACT EXTRACTION     COLONOSCOPY  06-05-2003   hems only   TONSILLECTOMY      reports that he quit smoking about 44 years ago. His smoking use included cigarettes. He has a 20.00 pack-year smoking history. He has never used smokeless tobacco. He reports that he does not drink alcohol and does not use drugs. family history includes Cancer in his father; Diabetes in his brother; Other (age of onset: 83) in his mother. Allergies  Allergen Reactions   Gemfibrozil     REACTION: unspecified   Lipitor [Atorvastatin]     Uneasy feeling   Niacin     REACTION: unspecified   Penicillins     REACTION: unspecified   Statins Other (See Comments)    myalgias   Valacyclovir Hcl     REACTION: unspecified   Current Outpatient Medications on File Prior to Visit  Medication Sig Dispense Refill   latanoprost (XALATAN) 0.005 % ophthalmic solution   0   lisinopril (ZESTRIL) 20 MG tablet TAKE  1 TABLET(20 MG) BY MOUTH DAILY 90 tablet 3   multivitamin (THERAGRAN) tablet Take 1 tablet by mouth daily.     pantoprazole (PROTONIX) 40 MG tablet Take 1 tablet (40 mg total) by mouth daily. 90 tablet 3   solifenacin (VESICARE) 5 MG tablet Take 1 tablet (5 mg total) by mouth daily. 90 tablet 3   benzonatate (TESSALON PERLES) 100 MG capsule 1 tab by mouth every 6 hrs as needed for cough (Patient not taking: Reported on 06/03/2021) 40 capsule 2   cephALEXin (KEFLEX) 500 MG capsule Take 1 capsule (500 mg total) by mouth 3 (three) times daily. (Patient not taking: Reported on 06/03/2021) 30 capsule 0   citalopram (CELEXA) 10 MG tablet Take 1 tablet (10 mg total) by mouth daily. (Patient not taking: Reported on  06/03/2021) 90 tablet 3   dorzolamide-timolol (COSOPT) 22.3-6.8 MG/ML ophthalmic solution  (Patient not taking: Reported on 06/03/2021)     No current facility-administered medications on file prior to visit.        ROS:  All others reviewed and negative.  Objective        PE:  BP 110/60    Pulse 88    Temp 97.9 F (36.6 C) (Oral)    Ht 6' (1.829 m)    Wt 145 lb 8 oz (66 kg)    SpO2 99%    BMI 19.73 kg/m                 Constitutional: Pt appears in NAD               HENT: Head: NCAT.                Right Ear: External ear normal.                 Left Ear: External ear normal.                Eyes: . Pupils are equal, round, and reactive to light. Conjunctivae and EOM are normal               Nose: without d/c or deformity               Neck: Neck supple. Gross normal ROM               Cardiovascular: Normal rate and regular rhythm.                 Pulmonary/Chest: Effort normal and breath sounds without rales or wheezing.                Abd:  Soft, NT, ND, + BS, no organomegaly               Neurological: Pt is alert. At baseline orientation, motor grossly intact               Skin: Skin is warm. No rashes, no other new lesions, LE edema - none               Psychiatric: Pt behavior is normal without agitation   Micro: none  Cardiac tracings I have personally interpreted today:  none  Pertinent Radiological findings (summarize): none   Lab Results  Component Value Date   WBC 2.9 (L) 06/03/2021   HGB 12.2 (L) 06/03/2021   HCT 37.6 (L) 06/03/2021   PLT 190.0 06/03/2021   GLUCOSE 86 06/03/2021   CHOL 245 (H) 06/03/2021   TRIG 64.0 06/03/2021   HDL  83.00 06/03/2021   LDLDIRECT 123.0 01/12/2017   LDLCALC 150 (H) 06/03/2021   ALT 23 06/03/2021   AST 22 06/03/2021   NA 140 06/03/2021   K 4.3 06/03/2021   CL 102 06/03/2021   CREATININE 1.18 06/03/2021   BUN 24 (H) 06/03/2021   CO2 27 06/03/2021   TSH 0.96 06/03/2021   PSA 3.83 06/03/2021   INR 1.1 (H) 06/03/2021   HGBA1C  5.7 06/03/2021   Assessment/Plan:  [name] Good is a 81 y.o. Black or African American [2] male with  has a past medical history of Balanitis, Cataract, Glaucoma, adenomatous polyp of colon (03/09/2014), Hyperlipidemia, Hypertension, Osteoarthritis, and Stroke ([place]).  Encounter for well adult exam with abnormal findings Age and sex appropriate education and counseling updated with regular exercise and diet Referrals for preventative services - none needed Immunizations addressed - declines covid booster, flu shot, shingrix Smoking counseling  - none needed Evidence for depression or other mood disorder - none significant Most recent labs reviewed. I have personally reviewed and have noted: 1) the patient's medical and social history 2) The patient's current medications and supplements 3) The patient's height, weight, and BMI have been recorded in the chart   Blood loss Etiology unclear, and none for past wk, exam benign today, for cbc with labs and FOBT  Essential hypertension BP Readings from Last 3 Encounters:  06/03/21 110/60  04/04/21 (!) 146/76  01/03/21 132/76   Stable, pt to continue medical treatment lisinopril   Generalized weakness For labs and cxr as ordered today, and refer [place] with PT  Hyperglycemia Lab Results  Component Value Date   HGBA1C 5.7 06/03/2021   Stable, pt to continue current medical treatment  - diet   Low vitamin D level Last vitamin D Lab Results  Component Value Date   VD25OH 45.65 06/03/2021   Stable, cont oral replacement   Polymyalgia rheumatica ([place]) Also for esr with labs  PSA elevation Also for f/u psa  Followup: Return in about 3 months (around 08/31/2021).  Cathlean Cower, MD 06/07/2021 9:32 PM [facility]

## 2021-06-04 ENCOUNTER — Encounter: Payer: Self-pay | Admitting: Internal Medicine

## 2021-06-04 LAB — URINALYSIS, ROUTINE W REFLEX MICROSCOPIC
Bilirubin Urine: NEGATIVE
Hgb urine dipstick: NEGATIVE
Ketones, ur: NEGATIVE
Nitrite: NEGATIVE
RBC / HPF: NONE SEEN (ref 0–?)
Specific Gravity, Urine: 1.025 (ref 1.000–1.030)
Total Protein, Urine: NEGATIVE
Urine Glucose: NEGATIVE
Urobilinogen, UA: 0.2 (ref 0.0–1.0)
pH: 6 (ref 5.0–8.0)

## 2021-06-04 LAB — BASIC METABOLIC PANEL
BUN: 24 mg/dL — ABNORMAL HIGH (ref 6–23)
CO2: 27 mEq/L (ref 19–32)
Calcium: 9.8 mg/dL (ref 8.4–10.5)
Chloride: 102 mEq/L (ref 96–112)
Creatinine, Ser: 1.18 mg/dL (ref 0.40–1.50)
GFR: 57.97 mL/min — ABNORMAL LOW (ref >60.00)
Glucose, Bld: 86 mg/dL (ref 70–99)
Potassium: 4.3 mEq/L (ref 3.5–5.1)
Sodium: 140 mEq/L (ref 135–145)

## 2021-06-04 LAB — HEPATIC FUNCTION PANEL
ALT: 23 U/L (ref 0–53)
AST: 22 U/L (ref 0–37)
Albumin: 4.7 g/dL (ref 3.5–5.2)
Alkaline Phosphatase: 63 U/L (ref 39–117)
Bilirubin, Direct: 0.1 mg/dL (ref 0.0–0.3)
Total Bilirubin: 0.6 mg/dL (ref 0.2–1.2)
Total Protein: 7.8 g/dL (ref 6.0–8.3)

## 2021-06-04 LAB — IBC PANEL
Iron: 104 ug/dL (ref 42–165)
Saturation Ratios: 31.3 % (ref 20.0–50.0)
TIBC: 331.8 ug/dL (ref 250.0–450.0)
Transferrin: 237 mg/dL (ref 212.0–360.0)

## 2021-06-04 LAB — LIPID PANEL
Cholesterol: 245 mg/dL — ABNORMAL HIGH (ref 0–200)
HDL: 83 mg/dL (ref >39.00)
LDL Cholesterol: 150 mg/dL — ABNORMAL HIGH (ref 0–99)
NonHDL: 162.31
Total CHOL/HDL Ratio: 3
Triglycerides: 64 mg/dL (ref 0.0–149.0)
VLDL: 12.8 mg/dL (ref 0.0–40.0)

## 2021-06-04 LAB — CK: Total CK: 107 U/L (ref 7–232)

## 2021-06-07 ENCOUNTER — Encounter: Payer: Self-pay | Admitting: Internal Medicine

## 2021-06-07 NOTE — Assessment & Plan Note (Signed)
BP Readings from Last 3 Encounters:  06/03/21 110/60  04/04/21 (!) 146/76  01/03/21 132/76   Stable, pt to continue medical treatment lisinopril

## 2021-06-07 NOTE — Assessment & Plan Note (Signed)
Also for f/u psa 

## 2021-06-07 NOTE — Assessment & Plan Note (Signed)
Age and sex appropriate education and counseling updated with regular exercise and diet Referrals for preventative services - none needed Immunizations addressed - declines covid booster, flu shot, shingrix Smoking counseling  - none needed Evidence for depression or other mood disorder - none significant Most recent labs reviewed. I have personally reviewed and have noted: 1) the patient's medical and social history 2) The patient's current medications and supplements 3) The patient's height, weight, and BMI have been recorded in the chart  

## 2021-06-07 NOTE — Assessment & Plan Note (Signed)
Also for esr with labs °

## 2021-06-07 NOTE — Assessment & Plan Note (Signed)
Etiology unclear, and none for past wk, exam benign today, for cbc with labs and FOBT

## 2021-06-07 NOTE — Assessment & Plan Note (Addendum)
For labs and cxr as ordered today, and refer [place] with PT

## 2021-06-07 NOTE — Assessment & Plan Note (Signed)
Last vitamin D Lab Results  Component Value Date   VD25OH 45.65 06/03/2021   Stable, cont oral replacement  

## 2021-06-07 NOTE — Assessment & Plan Note (Signed)
Lab Results  °Component Value Date  ° HGBA1C 5.7 06/03/2021  ° °Stable, pt to continue current medical treatment  - diet ° °

## 2021-06-11 ENCOUNTER — Telehealth: Payer: Self-pay

## 2021-06-11 NOTE — Telephone Encounter (Signed)
Verbals given  

## 2021-06-11 NOTE — Telephone Encounter (Signed)
April is calling to request verbal orders for [facility] nursing for 1 time a week for 6 weeks.  Pt has a rash on buttock and April believes that's where some of the bleeding maybe coming from. Wanting a order for Zinc paste.  Please advise [phone]

## 2021-06-11 NOTE — Telephone Encounter (Signed)
Ok for verbals 

## 2021-07-03 ENCOUNTER — Telehealth: Payer: Self-pay | Admitting: Internal Medicine

## 2021-07-03 MED ORDER — BENZONATATE 100 MG PO CAPS: ORAL_CAPSULE | ORAL | 2 refills | Status: DC

## 2021-07-03 NOTE — Telephone Encounter (Signed)
Pts sister Vickii Chafe states pt is having a productive persistent cough since 3-13 ? ?Caller stated pt was suppose to receive a rx for cough medication at last ov ? ?Caller requesting a cb to discuss if rx can be sent in or otc recommendations  ? ?Caller is only listed on [facility] dpr, no pt information was released  ?

## 2021-07-03 NOTE — Telephone Encounter (Signed)
I think they might mean the tessalon perle - done erx ?

## 2021-07-04 NOTE — Telephone Encounter (Signed)
Unable to reach patient or leave message.

## 2021-07-08 ENCOUNTER — Telehealth: Payer: Self-pay

## 2021-07-08 DIAGNOSIS — R269 Unspecified abnormalities of gait and mobility: Secondary | ICD-10-CM

## 2021-07-08 DIAGNOSIS — R296 Repeated falls: Secondary | ICD-10-CM

## 2021-07-08 NOTE — Telephone Encounter (Signed)
Pt physical therapist advise that pt wife call to see if he can get a Neurologist to see if something is going on. Pt fall out of bed.  ? ?Pt takes a long time to get up at times wife describes it as he is thinking about how to get up without falling. ? ?Please advise Pt CB [phone] ?

## 2021-07-08 NOTE — Telephone Encounter (Signed)
Ok this is done 

## 2021-07-09 NOTE — Telephone Encounter (Signed)
Pt and Sister Jeani Hawking were contacted and was made aware of the referral process. ? ?FYI ?

## 2021-07-11 DIAGNOSIS — H401133 Primary open-angle glaucoma, bilateral, severe stage: Secondary | ICD-10-CM | POA: Diagnosis not present

## 2021-09-01 ENCOUNTER — Encounter: Payer: Self-pay | Admitting: Internal Medicine

## 2021-09-01 ENCOUNTER — Ambulatory Visit (INDEPENDENT_AMBULATORY_CARE_PROVIDER_SITE_OTHER): Payer: Medicare HMO | Admitting: Internal Medicine

## 2021-09-01 VITALS — BP 110/58 | HR 58 | Temp 98.1°F | Ht 72.0 in | Wt 142.0 lb

## 2021-09-01 DIAGNOSIS — R739 Hyperglycemia, unspecified: Secondary | ICD-10-CM

## 2021-09-01 DIAGNOSIS — E7800 Pure hypercholesterolemia, unspecified: Secondary | ICD-10-CM

## 2021-09-01 DIAGNOSIS — F32A Depression, unspecified: Secondary | ICD-10-CM | POA: Diagnosis not present

## 2021-09-01 DIAGNOSIS — R7989 Other specified abnormal findings of blood chemistry: Secondary | ICD-10-CM | POA: Diagnosis not present

## 2021-09-01 DIAGNOSIS — R531 Weakness: Secondary | ICD-10-CM

## 2021-09-01 DIAGNOSIS — R32 Unspecified urinary incontinence: Secondary | ICD-10-CM | POA: Diagnosis not present

## 2021-09-01 DIAGNOSIS — N3943 Post-void dribbling: Secondary | ICD-10-CM

## 2021-09-01 LAB — CBC WITH DIFFERENTIAL/PLATELET
Basophils Absolute: 0 10*3/uL (ref 0.0–0.1)
Basophils Relative: 1 % (ref 0.0–3.0)
Eosinophils Absolute: 0.2 10*3/uL (ref 0.0–0.7)
Eosinophils Relative: 4.8 % (ref 0.0–5.0)
HCT: 35.9 % — ABNORMAL LOW (ref 39.0–52.0)
Hemoglobin: 11.9 g/dL — ABNORMAL LOW (ref 13.0–17.0)
Lymphocytes Relative: 28.9 % (ref 12.0–46.0)
Lymphs Abs: 1.2 10*3/uL (ref 0.7–4.0)
MCHC: 33.1 g/dL (ref 30.0–36.0)
MCV: 87.7 fl (ref 78.0–100.0)
Monocytes Absolute: 0.6 10*3/uL (ref 0.1–1.0)
Monocytes Relative: 13.6 % — ABNORMAL HIGH (ref 3.0–12.0)
Neutro Abs: 2.2 10*3/uL (ref 1.4–7.7)
Neutrophils Relative %: 51.7 % (ref 43.0–77.0)
Platelets: 193 10*3/uL (ref 150.0–400.0)
RBC: 4.09 Mil/uL — ABNORMAL LOW (ref 4.22–5.81)
RDW: 12.6 % (ref 11.5–15.5)
WBC: 4.2 10*3/uL (ref 4.0–10.5)

## 2021-09-01 MED ORDER — CITALOPRAM HYDROBROMIDE 10 MG PO TABS: 10.0000 mg | ORAL_TABLET | Freq: Every day | ORAL | 3 refills | Status: DC

## 2021-09-01 NOTE — Patient Instructions (Addendum)
Please take all new medication as prescribed - the celexa 10 mg per day for low mood ? ?You will be contacted regarding the referral for: urology ? ?Please continue all other medications as before, and refills have been done if requested. ? ?Please have the pharmacy call with any other refills you may need. ? ?Please continue your efforts at being more active, low cholesterol diet, and weight control. ? ?Please keep your appointments with your specialists as you may have planned ? ?Please go to the LAB at the blood drawing area for the tests to be done ? ?You will be contacted by phone if any changes need to be made immediately.  Otherwise, you will receive a letter about your results with an explanation, but please check with MyChart first. ? ?Please remember to sign up for MyChart if you have not done so, as this will be important to you in the future with finding out test results, communicating by private email, and scheduling acute appointments online when needed. ? ?Please make an Appointment to return in 3 months, or sooner if needed ?

## 2021-09-01 NOTE — Assessment & Plan Note (Signed)
etiolgy unclear, exam o/w benign, for labs including cbc ?

## 2021-09-01 NOTE — Assessment & Plan Note (Signed)
Lab Results  °Component Value Date  ° HGBA1C 5.7 06/03/2021  ° °Stable, pt to continue current medical treatment  - diet ° °

## 2021-09-01 NOTE — Assessment & Plan Note (Signed)
Lab Results  ?Component Value Date  ? LDLCALC 150 (H) 06/03/2021  ? ?Severe uncontrolled, goal ldl < 70, pt to continue low chol diet  - declines statin ? ?

## 2021-09-01 NOTE — Progress Notes (Signed)
Patient ID: [name] Good, male   DOB: Mar 04, 1941, 81 y.o.   MRN: [id number] ? ? ? ?    Chief Complaint: follow up depression, fatigue, hyperglycemia, lowvit d, htn, urinary incontinence ? ?     HPI:  [name] Good is a 81 y.o. male here with wife quite frustrated with his apparent not wanting to further pariticipate in his care overall, and "justs want to go to the nursing hoome."  Pt with fatigue - Does c/o ongoing fatigue, but denies signficant daytime hypersomnolence.  Has had moderate worsening depressive symptoms, but  denies suicidal ideation, or panic; has gotten to the point he simply wont speak to the wife, but will speak and cooperate with others.  Has difficulty ambulating with her at her request, but seems to walk down the street with PT relatively ok.  Did fall out of bed one time but no other falls or injury.  Pt denies chest pain, increased sob or doe, wheezing, orthopnea, PND, increased LE swelling, palpitations, dizziness or syncope.   Pt denies polydipsia, polyuria, or new focal neuro s/s.   Pt denies fever, wt loss, night sweats, loss of appetite, or other constitutional symptoms  Denies urinary symptoms such as dysuria, frequency, urgency, flank pain, hematuria or n/v, fever, chills, but does have ongoing incontinence worsening with accidents despite the depends use ? ?Wt Readings from Last 3 Encounters:  ?09/01/21 142 lb (64.4 kg)  ?06/03/21 145 lb 8 oz (66 kg)  ?01/03/21 143 lb (64.9 kg)  ? ?BP Readings from Last 3 Encounters:  ?09/01/21 (!) 110/58  ?06/03/21 110/60  ?04/04/21 (!) 146/76  ? ?      ?Past Medical History:  ?Diagnosis Date  ? Balanitis   ? hx of  ? Cataract   ? Glaucoma   ? Hx of adenomatous polyp of colon 03/09/2014  ? Hyperlipidemia   ? Hypertension   ? Osteoarthritis   ? diffuse  ? Stroke [facility])   ? pt states ? when had  ? ?Past Surgical History:  ?Procedure Laterality Date  ? CATARACT EXTRACTION    ? COLONOSCOPY  06-05-2003  ? hems only  ? TONSILLECTOMY    ? ?  reports that he quit smoking about 44 years ago. His smoking use included cigarettes. He has a 20.00 pack-year smoking history. He has never used smokeless tobacco. He reports that he does not drink alcohol and does not use drugs. ?family history includes Cancer in his father; Diabetes in his brother; Other (age of onset: 34) in his mother. ?Allergies  ?Allergen Reactions  ? Gemfibrozil   ?  REACTION: unspecified  ? Lipitor [Atorvastatin]   ?  Uneasy feeling  ? Niacin   ?  REACTION: unspecified  ? Penicillins   ?  REACTION: unspecified  ? Statins Other (See Comments)  ?  myalgias  ? Valacyclovir Hcl   ?  REACTION: unspecified  ? ?Current Outpatient Medications on File Prior to Visit  ?Medication Sig Dispense Refill  ? benzonatate (TESSALON PERLES) 100 MG capsule 1 tab by mouth every 6 hrs as needed for cough 60 capsule 2  ? Cholecalciferol (VITAMIN D3) 50 MCG (2000 UT) TABS Take by mouth.    ? dorzolamide-timolol (COSOPT) 22.3-6.8 MG/ML ophthalmic solution     ? lisinopril (ZESTRIL) 20 MG tablet TAKE 1 TABLET(20 MG) BY MOUTH DAILY 90 tablet 3  ? multivitamin (THERAGRAN) tablet Take 1 tablet by mouth daily.    ? pantoprazole (PROTONIX) 40 MG tablet Take 1 tablet (  40 mg total) by mouth daily. 90 tablet 3  ? solifenacin (VESICARE) 5 MG tablet Take 1 tablet (5 mg total) by mouth daily. 90 tablet 3  ? TURMERIC CURCUMIN PO Take by mouth.    ? ?No current facility-administered medications on file prior to visit.  ? ?     ROS:  All others reviewed and negative. ? ?Objective  ? ?     PE:  BP (!) 110/58 (BP Location: Right Arm, Patient Position: Sitting, Cuff Size: Normal)   Pulse (!) 58   Temp 98.1 ?F (36.7 ?C) (Oral)   Ht 6' (1.829 m)   Wt 142 lb (64.4 kg)   SpO2 100%   BMI 19.26 kg/m?  ? ?              Constitutional: Pt appears in NAD ?              HENT: Head: NCAT.  ?              Right Ear: External ear normal.   ?              Left Ear: External ear normal.  ?              Eyes: . Pupils are equal, round, and  reactive to light. Conjunctivae and EOM are normal ?              Nose: without d/c or deformity ?              Neck: Neck supple. Gross normal ROM ?              Cardiovascular: Normal rate and regular rhythm.   ?              Pulmonary/Chest: Effort normal and breath sounds without rales or wheezing.  ?              Abd:  Soft, NT, ND, + BS, no organomegaly ?              Neurological: Pt is alert. At baseline orientation, motor grossly intact ?              Skin: Skin is warm. No rashes, no other new lesions, LE edema - none ?              Psychiatric: Pt behavior is normal without agitation , marked depressed affect ? ?Micro: none ? ?Cardiac tracings I have personally interpreted today:  none ? ?Pertinent Radiological findings (summarize): none  ? ?Lab Results  ?Component Value Date  ? WBC 2.9 (L) 06/03/2021  ? HGB 12.2 (L) 06/03/2021  ? HCT 37.6 (L) 06/03/2021  ? PLT 190.0 06/03/2021  ? GLUCOSE 86 06/03/2021  ? CHOL 245 (H) 06/03/2021  ? TRIG 64.0 06/03/2021  ? HDL 83.00 06/03/2021  ? LDLDIRECT 123.0 01/12/2017  ? LDLCALC 150 (H) 06/03/2021  ? ALT 23 06/03/2021  ? AST 22 06/03/2021  ? NA 140 06/03/2021  ? K 4.3 06/03/2021  ? CL 102 06/03/2021  ? CREATININE 1.18 06/03/2021  ? BUN 24 (H) 06/03/2021  ? CO2 27 06/03/2021  ? TSH 0.96 06/03/2021  ? PSA 3.83 06/03/2021  ? INR 1.1 (H) 06/03/2021  ? HGBA1C 5.7 06/03/2021  ? ?Assessment/Plan:  ?[name] Good is a 81 y.o. Black or African American [2] male with  has a past medical history of Balanitis, Cataract, Glaucoma, adenomatous polyp of colon (03/09/2014), Hyperlipidemia, Hypertension, Osteoarthritis, and Stroke (  [place]). ? ?Depression ?Please take all new medication as prescribed - celexa 10 qd, delcines need for counseling referral or couples counseling ? ?Urinary incontinence, post-void dribbling ?Also worsening recently with home accidents - for urology referral ? ?Generalized weakness ?etiolgy unclear, exam o/w benign, for labs including  cbc ? ?Hyperglycemia ?Lab Results  ?Component Value Date  ? HGBA1C 5.7 06/03/2021  ? ?Stable, pt to continue current medical treatment - diet ? ? ?Hyperlipidemia ?Lab Results  ?Component Value Date  ? LDLCALC 150 (H) 06/03/2021  ? ?Severe uncontrolled, goal ldl < 70, pt to continue low chol diet  - declines statin ? ? ?Low vitamin D level ?Last vitamin D ?Lab Results  ?Component Value Date  ? VD25OH 45.65 06/03/2021  ? ?Stable, cont oral replacement ? ?Followup: Return in about 3 months (around 12/02/2021). ? ?Cathlean Cower, MD 09/01/2021 8:31 PM ?[facility] ?

## 2021-09-01 NOTE — Assessment & Plan Note (Signed)
Also worsening recently with home accidents - for urology referral ?

## 2021-09-01 NOTE — Assessment & Plan Note (Signed)
Last vitamin D Lab Results  Component Value Date   VD25OH 45.65 06/03/2021   Stable, cont oral replacement  

## 2021-09-01 NOTE — Assessment & Plan Note (Signed)
Please take all new medication as prescribed - celexa 10 qd, delcines need for counseling referral or couples counseling ?

## 2021-09-02 ENCOUNTER — Encounter: Payer: Self-pay | Admitting: Internal Medicine

## 2021-09-02 LAB — HEPATIC FUNCTION PANEL
ALT: 16 U/L (ref 0–53)
AST: 17 U/L (ref 0–37)
Albumin: 4.2 g/dL (ref 3.5–5.2)
Alkaline Phosphatase: 66 U/L (ref 39–117)
Bilirubin, Direct: 0.1 mg/dL (ref 0.0–0.3)
Total Bilirubin: 0.3 mg/dL (ref 0.2–1.2)
Total Protein: 6.7 g/dL (ref 6.0–8.3)

## 2021-09-02 LAB — BASIC METABOLIC PANEL
BUN: 20 mg/dL (ref 6–23)
CO2: 28 mEq/L (ref 19–32)
Calcium: 9.3 mg/dL (ref 8.4–10.5)
Chloride: 106 mEq/L (ref 96–112)
Creatinine, Ser: 1.04 mg/dL (ref 0.40–1.50)
GFR: 67.34 mL/min (ref >60.00)
Glucose, Bld: 95 mg/dL (ref 70–99)
Potassium: 3.9 mEq/L (ref 3.5–5.1)
Sodium: 143 mEq/L (ref 135–145)

## 2021-09-02 LAB — LIPID PANEL
Cholesterol: 216 mg/dL — ABNORMAL HIGH (ref 0–200)
HDL: 67.3 mg/dL (ref >39.00)
LDL Cholesterol: 128 mg/dL — ABNORMAL HIGH (ref 0–99)
NonHDL: 148.71
Total CHOL/HDL Ratio: 3
Triglycerides: 106 mg/dL (ref 0.0–149.0)
VLDL: 21.2 mg/dL (ref 0.0–40.0)

## 2021-09-02 LAB — TSH: TSH: 0.76 u[IU]/mL (ref 0.35–5.50)

## 2021-09-02 LAB — HEMOGLOBIN A1C: Hgb A1c MFr Bld: 5.7 % (ref 4.6–6.5)

## 2021-09-08 ENCOUNTER — Telehealth: Payer: Self-pay | Admitting: Internal Medicine

## 2021-09-08 NOTE — Telephone Encounter (Signed)
Left message for patient to call back to schedule Medicare Annual Wellness Visit   Last AWV  01/12/17  Please schedule at anytime with [facility] if patient calls the office back.      Any questions, please call me at [phone]

## 2021-09-10 ENCOUNTER — Ambulatory Visit (INDEPENDENT_AMBULATORY_CARE_PROVIDER_SITE_OTHER): Payer: Medicare HMO

## 2021-09-10 DIAGNOSIS — Z0000 Encounter for general adult medical examination without abnormal findings: Secondary | ICD-10-CM

## 2021-09-10 NOTE — Progress Notes (Signed)
Subjective:   [name] Good is a 81 y.o. male who presents for Medicare Annual/Subsequent preventive examination.  Review of Systems           Objective:    There were no vitals filed for this visit. There is no height or weight on file to calculate BMI.     05/19/2017    9:35 PM 01/12/2017    1:39 PM 11/28/2014   10:41 AM 03/05/2014    9:31 AM 02/19/2014   10:01 AM  Advanced Directives  Does Patient Have a Medical Advance Directive? No No Yes No No  Copy of Healthcare Power of Attorney in Chart?   Yes    Would patient like information on creating a medical advance directive?  Yes (ED - Information included in AVS)  No - patient declined information     Current Medications (verified) Outpatient Encounter Medications as of 09/10/2021  Medication Sig   benzonatate (TESSALON PERLES) 100 MG capsule 1 tab by mouth every 6 hrs as needed for cough   lisinopril (ZESTRIL) 20 MG tablet TAKE 1 TABLET(20 MG) BY MOUTH DAILY   multivitamin (THERAGRAN) tablet Take 1 tablet by mouth daily.   pantoprazole (PROTONIX) 40 MG tablet Take 1 tablet (40 mg total) by mouth daily.   solifenacin (VESICARE) 5 MG tablet Take 1 tablet (5 mg total) by mouth daily.   Cholecalciferol (VITAMIN D3) 50 MCG (2000 UT) TABS Take by mouth. (Patient not taking: Reported on 09/10/2021)   citalopram (CELEXA) 10 MG tablet Take 1 tablet (10 mg total) by mouth daily. (Patient not taking: Reported on 09/10/2021)   dorzolamide-timolol (COSOPT) 22.3-6.8 MG/ML ophthalmic solution  (Patient not taking: Reported on 09/10/2021)   TURMERIC CURCUMIN PO Take by mouth. (Patient not taking: Reported on 09/10/2021)   No facility-administered encounter medications on file as of 09/10/2021.    Allergies (verified) Gemfibrozil, Lipitor [atorvastatin], Niacin, Penicillins, Statins, and Valacyclovir hcl   History: Past Medical History:  Diagnosis Date   Balanitis    hx of   Cataract    Glaucoma    Hx of adenomatous polyp of  colon 03/09/2014   Hyperlipidemia    Hypertension    Osteoarthritis    diffuse   Stroke [facility])    pt states ? when had   Past Surgical History:  Procedure Laterality Date   CATARACT EXTRACTION     COLONOSCOPY  06-05-2003   hems only   TONSILLECTOMY     Family History  Problem Relation Age of Onset   Other Mother 48       CVA   Cancer Father        lung   Diabetes Brother    Coronary artery disease Neg Hx    Colon cancer Neg Hx    Social History   Socioeconomic History   Marital status: Married    Spouse name: Not on file   Number of children: 0   Years of education: 18   Highest education level: Not on file  Occupational History   Occupation: PASTOR    Employer: [employer]  Tobacco Use   Smoking status: Former    Packs/day: 1.00    Years: 20.00    Pack years: 20.00    Types: Cigarettes    Quit date: 04/20/1977    Years since quitting: 44.4    Passive exposure: Never   Smokeless tobacco: Never  Vaping Use   Vaping Use: Never used  Substance and Sexual Activity   Alcohol  use: No    Alcohol/week: 0.0 standard drinks   Drug use: No   Sexual activity: Yes    Partners: Female  Other Topics Concern   Not on file  Social History Narrative   HSG, [employer], [place]. Married '62.    No children. 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 End of Life Care - provided packet (July '12)      Minister; Still pastor   Social Determinants of Health   Financial Resource Strain: Low Risk    Difficulty of Paying Living Expenses: Not hard at all  Food Insecurity: No Food Insecurity   Worried About Charity fundraiser in the Last Year: Never true   Arboriculturist in the Last Year: Never true  Transportation Needs: No Transportation Needs   Lack of Transportation (Medical): No   Lack of Transportation (Non-Medical): No  Physical Activity: Insufficiently Active   Days of Exercise per Week: 2 days   Minutes of Exercise per Session: 10 min   Stress: No Stress Concern Present   Feeling of Stress : Not at all  Social Connections: Socially Integrated   Frequency of Communication with Friends and Family: More than three times a week   Frequency of Social Gatherings with Friends and Family: Three times a week   Attends Religious Services: More than 4 times per year   Active Member of Clubs or Organizations: Yes   Attends Archivist Meetings: More than 4 times per year   Marital Status: Married    Tobacco Counseling Counseling given: Not Answered   Clinical Intake:                 Diabetic?No         Activities of Daily Living    09/20/2020    2:45 PM  In your present state of health, do you have any difficulty performing the following activities:  Hearing? 0  Vision? 0  Difficulty concentrating or making decisions? 0  Walking or climbing stairs? 0  Dressing or bathing? 0  Doing errands, shopping? 0    Patient Care Team: [name], MD as PCP - General (Internal Medicine) Marylynn Pearson, MD (Ophthalmology) Oliver Barre, MD as Referring Physician (Ophthalmology)  Indicate any recent Medical Services you may have received from other than [facility] providers in the past year (date may be approximate).     Assessment:   This is a routine wellness examination for [name] Good.  Hearing/Vision screen No results found.  Dietary issues and exercise activities discussed:     Goals Addressed   None   Depression Screen    09/10/2021    1:35 PM 09/01/2021    3:43 PM 06/03/2021    1:24 PM 09/20/2020    3:05 PM 11/06/2019    2:38 PM 05/23/2019    3:15 PM 05/23/2019    2:57 PM  PHQ 2/9 Scores  PHQ - 2 Score 0 6 0 '2 2 2 1  '$ PHQ- 9 Score 0 16   2      Fall Risk    09/01/2021    3:43 PM 06/03/2021    1:24 PM 09/20/2020    3:05 PM 11/06/2019    2:38 PM 05/23/2019    3:15 PM  [place] in the past year? 1 0 0 0 0  Number falls in past yr: 1  0 0   Injury with Fall? 0  0 0   Risk for fall  due to  :    No Fall Risks   Follow up    Falls evaluation completed     [place]:  Any stairs in or around the home? Yes  If so, are there any without handrails? No  Home free of loose throw rugs in walkways, pet beds, electrical cords, etc? Yes  Adequate lighting in your home to reduce risk of falls? Yes   ASSISTIVE DEVICES UTILIZED TO PREVENT FALLS:  Life alert? Yes  Use of a cane, walker or w/c? No  Grab bars in the bathroom? No  Shower chair or bench in shower? Yes  Elevated toilet seat or a handicapped toilet? No   TIMED UP AND GO:  Was the test performed? No .  Length of time to ambulate 10 feet:  N/A  Gait steady and fast with assistive device  Cognitive Function:    01/12/2017    1:54 PM 11/28/2014   10:47 AM  MMSE - Mini Mental State Exam  Not completed: Unable to complete Unable to complete        09/10/2021    1:38 PM  6CIT Screen  What Year? 0 points  What month? 0 points  What time? 3 points  Count back from 20 0 points  Months in reverse 0 points  Repeat phrase 10 points  Total Score 13 points    Immunizations Immunization History  Administered Date(s) Administered   Influenza Split 03/03/2011, 02/09/2012   Influenza Whole 04/06/2007, 01/31/2008, 02/26/2009, 03/21/2010   Influenza, High Dose Seasonal PF 02/14/2013, 01/09/2014, 01/12/2017, 01/27/2018   Influenza,inj,Quad PF,6+ Mos 12/10/2015   Influenza-Unspecified 01/05/2015   PFIZER(Purple Top)SARS-COV-2 Vaccination 06/16/2019, 07/12/2019, 02/03/2020   Pneumococcal Conjugate-13 11/29/2013   Pneumococcal Polysaccharide-23 09/02/2009, 03/03/2011   Td 12/19/1997, 02/26/2009   Tdap 05/23/2019   Zoster Recombinat (Shingrix) 08/28/2016   Zoster, Live 11/28/2014    TDAP status: Up to date  Flu Vaccine status: Due, Education has been provided regarding the importance of this vaccine. Advised may receive this vaccine at local pharmacy or Health Dept. Aware to provide a  copy of the vaccination record if obtained from local pharmacy or Health Dept. Verbalized acceptance and understanding.  Pneumococcal vaccine status: Due, Education has been provided regarding the importance of this vaccine. Advised may receive this vaccine at local pharmacy or Health Dept. Aware to provide a copy of the vaccination record if obtained from local pharmacy or Health Dept. Verbalized acceptance and understanding.  Covid-19 vaccine status: Completed vaccines  Qualifies for Shingles Vaccine? Yes   Zostavax completed Yes   Shingrix Completed?: Yes  Screening Tests Health Maintenance  Topic Date Due   Zoster Vaccines- Shingrix (2 of 2) 10/23/2016   COVID-19 Vaccine (4 - Booster for Pfizer series) 03/30/2020   INFLUENZA VACCINE  11/18/2021   TETANUS/TDAP  05/22/2029   Pneumonia Vaccine 58+ Years old  Completed   HPV VACCINES  Aged Out    Health Maintenance  Health Maintenance Due  Topic Date Due   Zoster Vaccines- Shingrix (2 of 2) 10/23/2016   COVID-19 Vaccine (4 - Booster for Pfizer series) 03/30/2020    Colorectal cancer screening: No longer required.   Lung Cancer Screening: (Low Dose CT Chest recommended if Age 6-80 years, 30 pack-year currently smoking OR have quit w/in 15years.) does not qualify.   Lung Cancer Screening Referral: Non smoker  Additional Screening:  Hepatitis C Screening: does not qualify; Completed N/A  Vision Screening: Recommended annual ophthalmology exams for  early detection of glaucoma and other disorders of the eye. Is the patient up to date with their annual eye exam?  Yes  Who is the provider or what is the name of the office in which the patient attends annual eye exams? Dr. Venetia Maxon If pt is not established with a provider, would they like to be referred to a provider to establish care? No .   Dental Screening: Recommended annual dental exams for proper oral hygiene  Community Resource Referral / Chronic Care Management: CRR  required this visit?  No   CCM required this visit?  No      Plan:     I have personally reviewed and noted the following in the patient's chart:   Medical and social history Use of alcohol, tobacco or illicit drugs  Current medications and supplements including opioid prescriptions. Patient is not currently taking opioid prescriptions. Functional ability and status Nutritional status Physical activity Advanced directives List of other physicians Hospitalizations, surgeries, and ER visits in previous 12 months Vitals Screenings to include cognitive, depression, and falls Referrals and appointments  In addition, I have reviewed and discussed with patient certain preventive protocols, quality metrics, and best practice recommendations. A written personalized care plan for preventive services as well as general preventive health recommendations were provided to patient.     Thomes Cake, [facility]   09/10/2021   Nurse Notes: Patient was pleasant during the visit. Denies any pain or concerns at present. Advised if anything changes to give our office a call. Office number was provided.

## 2021-10-17 ENCOUNTER — Telehealth: Payer: Self-pay | Admitting: Internal Medicine

## 2021-10-17 ENCOUNTER — Other Ambulatory Visit: Payer: Self-pay

## 2021-10-17 MED ORDER — LISINOPRIL 20 MG PO TABS: ORAL_TABLET | ORAL | 3 refills | Status: DC

## 2021-11-17 ENCOUNTER — Ambulatory Visit (INDEPENDENT_AMBULATORY_CARE_PROVIDER_SITE_OTHER): Payer: Medicare HMO | Admitting: Internal Medicine

## 2021-11-17 ENCOUNTER — Encounter: Payer: Self-pay | Admitting: Internal Medicine

## 2021-11-17 VITALS — BP 138/74 | HR 55 | Temp 98.5°F | Ht 72.0 in | Wt 139.4 lb

## 2021-11-17 DIAGNOSIS — K4090 Unilateral inguinal hernia, without obstruction or gangrene, not specified as recurrent: Secondary | ICD-10-CM

## 2021-11-17 DIAGNOSIS — R739 Hyperglycemia, unspecified: Secondary | ICD-10-CM

## 2021-11-17 DIAGNOSIS — R7989 Other specified abnormal findings of blood chemistry: Secondary | ICD-10-CM | POA: Diagnosis not present

## 2021-11-17 DIAGNOSIS — I10 Essential (primary) hypertension: Secondary | ICD-10-CM | POA: Diagnosis not present

## 2021-11-17 NOTE — Assessment & Plan Note (Signed)
BP Readings from Last 3 Encounters:  11/17/21 138/74  09/01/21 (!) 110/58  06/03/21 110/60   Stable, pt to continue medical treatment lisinopril 20 mg qd

## 2021-11-17 NOTE — Assessment & Plan Note (Signed)
Lab Results  Component Value Date   HGBA1C 5.7 09/01/2021   Stable, pt to continue current medical treatment  - diet, wt control, excercise

## 2021-11-17 NOTE — Assessment & Plan Note (Signed)
Last vitamin D Lab Results  Component Value Date   VD25OH 45.65 06/03/2021   Stable, cont oral replacement

## 2021-11-17 NOTE — Progress Notes (Signed)
Patient ID: [name] Good, male   DOB: [birth year], 81 y.o.   MRN: [id number]        Chief Complaint: follow up increased right inguinal hernia       HPI:  [name] Good is a 81 y.o. male here with c/o worsening size and discomfort off andon mild to right inguinal area with swelling, mild tender but reducible, wondering if ok for referral general surgury.  Denies urinary symptoms such as dysuria, frequency, urgency, flank pain, hematuria or n/v, fever, chills, and sees urology now for incontinence.  Pt denies chest pain, increased sob or doe, wheezing, orthopnea, PND, increased LE swelling, palpitations, dizziness or syncope.   Pt denies polydipsia, polyuria, or new focal neuro s/s.          Wt Readings from Last 3 Encounters:  11/17/21 139 lb 6.4 oz (63.2 kg)  09/01/21 142 lb (64.4 kg)  06/03/21 145 lb 8 oz (66 kg)   BP Readings from Last 3 Encounters:  11/17/21 138/74  09/01/21 (!) 110/58  06/03/21 110/60         Past Medical History:  Diagnosis Date   Balanitis    hx of   Cataract    Glaucoma    Hx of adenomatous polyp of colon 03/09/2014   Hyperlipidemia    Hypertension    Osteoarthritis    diffuse   Stroke [facility])    pt states ? when had   Past Surgical History:  Procedure Laterality Date   CATARACT EXTRACTION     COLONOSCOPY  06-05-2003   hems only   TONSILLECTOMY      reports that he quit smoking about 44 years ago. His smoking use included cigarettes. He has a 20.00 pack-year smoking history. He has never been exposed to tobacco smoke. He has never used smokeless tobacco. He reports that he does not drink alcohol and does not use drugs. family history includes Cancer in his father; Diabetes in his brother; Other (age of onset: 44) in his mother. Allergies  Allergen Reactions   Gemfibrozil     REACTION: unspecified   Lipitor [Atorvastatin]     Uneasy feeling   Niacin     REACTION: unspecified   Penicillins     REACTION: unspecified   Statins Other  (See Comments)    myalgias   Valacyclovir Hcl     REACTION: unspecified   Current Outpatient Medications on File Prior to Visit  Medication Sig Dispense Refill   benzonatate (TESSALON PERLES) 100 MG capsule 1 tab by mouth every 6 hrs as needed for cough 60 capsule 2   Cholecalciferol (VITAMIN D3) 50 MCG (2000 UT) TABS Take by mouth.     citalopram (CELEXA) 10 MG tablet Take 1 tablet (10 mg total) by mouth daily. 90 tablet 3   dorzolamide-timolol (COSOPT) 22.3-6.8 MG/ML ophthalmic solution      lisinopril (ZESTRIL) 20 MG tablet TAKE 1 TABLET(20 MG) BY MOUTH DAILY 90 tablet 3   multivitamin (THERAGRAN) tablet Take 1 tablet by mouth daily.     pantoprazole (PROTONIX) 40 MG tablet Take 1 tablet (40 mg total) by mouth daily. 90 tablet 3   solifenacin (VESICARE) 5 MG tablet Take 1 tablet (5 mg total) by mouth daily. 90 tablet 3   TURMERIC CURCUMIN PO Take by mouth.     No current facility-administered medications on file prior to visit.        ROS:  All others reviewed and negative.  Objective  PE:  BP 138/74 (BP Location: Left Arm, Patient Position: Sitting, Cuff Size: Normal)   Pulse (!) 55   Temp 98.5 F (36.9 C) (Oral)   Ht 6' (1.829 m)   Wt 139 lb 6.4 oz (63.2 kg)   SpO2 99%   BMI 18.91 kg/m                 Constitutional: Pt appears in NAD               HENT: Head: NCAT.                Right Ear: External ear normal.                 Left Ear: External ear normal.                Eyes: . Pupils are equal, round, and reactive to light. Conjunctivae and EOM are normal               Nose: without d/c or deformity               Neck: Neck supple. Gross normal ROM               Cardiovascular: Normal rate and regular rhythm.                 Pulmonary/Chest: Effort normal and breath sounds without rales or wheezing.                Abd:  Soft, NT, ND, + BS, no organomegaly; + moderate RIH swelling, minor tender with mostly able to reduce               Neurological: Pt is  alert. At baseline orientation, motor grossly intact               Skin: Skin is warm. No rashes, no other new lesions, LE edema - none               Psychiatric: Pt behavior is normal without agitation   Micro: none  Cardiac tracings I have personally interpreted today:  none  Pertinent Radiological findings (summarize): none   Lab Results  Component Value Date   WBC 4.2 09/01/2021   HGB 11.9 (L) 09/01/2021   HCT 35.9 (L) 09/01/2021   PLT 193.0 09/01/2021   GLUCOSE 95 09/01/2021   CHOL 216 (H) 09/01/2021   TRIG 106.0 09/01/2021   HDL 67.30 09/01/2021   LDLDIRECT 123.0 01/12/2017   LDLCALC 128 (H) 09/01/2021   ALT 16 09/01/2021   AST 17 09/01/2021   NA 143 09/01/2021   K 3.9 09/01/2021   CL 106 09/01/2021   CREATININE 1.04 09/01/2021   BUN 20 09/01/2021   CO2 28 09/01/2021   TSH 0.76 09/01/2021   PSA 3.83 06/03/2021   INR 1.1 (H) 06/03/2021   HGBA1C 5.7 09/01/2021   Assessment/Plan:  [name] Good is a 81 y.o. Black or African American [2] male with  has a past medical history of Balanitis, Cataract, Glaucoma, adenomatous polyp of colon (03/09/2014), Hyperlipidemia, Hypertension, Osteoarthritis, and Stroke ([place]).  Right inguinal hernia With mod worsening size and some discomfort, none current, ok for referral general surgury  Essential hypertension BP Readings from Last 3 Encounters:  11/17/21 138/74  09/01/21 (!) 110/58  06/03/21 110/60   Stable, pt to continue medical treatment lisinopril 20 mg qd   Hyperglycemia Lab Results  Component Value Date   HGBA1C 5.7 09/01/2021  Stable, pt to continue current medical treatment  - diet, wt control, excercise   Low vitamin D level Last vitamin D Lab Results  Component Value Date   VD25OH 45.65 06/03/2021   Stable, cont oral replacement  Followup: Return in about 3 months (around 02/17/2022).  Cathlean Cower, MD 11/17/2021 1:04 PM [facility]

## 2021-11-17 NOTE — Patient Instructions (Addendum)
Please continue all other medications as before, and refills have been done if requested.  Please have the pharmacy call with any other refills you may need.  Please continue your efforts at being more active, low cholesterol diet, and weight control.  Please keep your appointments with your specialists as you may have planned  You will be contacted regarding the referral for: general surgury  Please make an Appointment to return in 3 months, or sooner if needed

## 2021-11-17 NOTE — Assessment & Plan Note (Signed)
With mod worsening size and some discomfort, none current, ok for referral general surgury

## 2021-12-02 ENCOUNTER — Other Ambulatory Visit: Payer: Self-pay

## 2021-12-02 ENCOUNTER — Other Ambulatory Visit: Payer: Self-pay | Admitting: Internal Medicine

## 2021-12-02 MED ORDER — BENZONATATE 100 MG PO CAPS: ORAL_CAPSULE | ORAL | 2 refills | Status: DC

## 2021-12-04 ENCOUNTER — Other Ambulatory Visit: Payer: Self-pay

## 2021-12-04 MED ORDER — PANTOPRAZOLE SODIUM 40 MG PO TBEC: 40.0000 mg | DELAYED_RELEASE_TABLET | Freq: Every day | ORAL | 3 refills | Status: DC

## 2022-01-21 ENCOUNTER — Other Ambulatory Visit: Payer: Self-pay

## 2022-01-21 MED ORDER — SOLIFENACIN SUCCINATE 5 MG PO TABS: 5.0000 mg | ORAL_TABLET | Freq: Every day | ORAL | 3 refills | Status: AC

## 2022-02-05 ENCOUNTER — Telehealth: Payer: Self-pay | Admitting: Internal Medicine

## 2022-02-05 NOTE — Telephone Encounter (Signed)
PT and PT's sister call today in regards to onset of symptoms. PT is having issues with a hernia and is in pain. PT was wanting to know if there were any over the counter medications Dr.John would suggest to deal with this issue. PT was seen late-July for a similar issue it looks like.  CB: [phone] Alternate CB: [phone]

## 2022-02-05 NOTE — Telephone Encounter (Signed)
Patient's sister Vickii Chafe is calling in asking for an update.

## 2022-02-06 MED ORDER — TRAMADOL HCL 50 MG PO TABS: 50.0000 mg | ORAL_TABLET | Freq: Four times a day (QID) | ORAL | 0 refills | Status: DC | PRN

## 2022-02-06 NOTE — Telephone Encounter (Signed)
Patient has not received call from [facility] [place]. Was having pain and swelling, taken tylenol and is better today and there is no swelling. Is there anything you can send for pain and they will call [facility] [place] on Monday.

## 2022-02-06 NOTE — Telephone Encounter (Signed)
Ok for tramadol prn -   But pt should go to ED for any worsening pain or constant pain that cant be better with lying down or pushing the swelling back in

## 2022-02-10 NOTE — Telephone Encounter (Signed)
Patient called back and states that he is not currently in any pain. Also states that he has used some Tylenol Arthritis Strength and it seems to help him. Call back number is [phone].

## 2022-02-10 NOTE — Telephone Encounter (Signed)
Left message for a call back to get an update on pain management.

## 2022-02-17 ENCOUNTER — Encounter (HOSPITAL_COMMUNITY): Payer: Self-pay

## 2022-02-17 ENCOUNTER — Other Ambulatory Visit: Payer: Self-pay

## 2022-02-17 ENCOUNTER — Observation Stay (HOSPITAL_COMMUNITY)
Admission: EM | Admit: 2022-02-17 | Discharge: 2022-02-24 | Disposition: A | Discharge status: Skilled Nursing Facility | Payer: Medicare HMO | Attending: Internal Medicine | Admitting: Internal Medicine

## 2022-02-17 ENCOUNTER — Emergency Department (HOSPITAL_COMMUNITY): Payer: Medicare HMO

## 2022-02-17 DIAGNOSIS — Y92009 Unspecified place in unspecified non-institutional (private) residence as the place of occurrence of the external cause: Secondary | ICD-10-CM | POA: Diagnosis not present

## 2022-02-17 DIAGNOSIS — W19XXXA Unspecified fall, initial encounter: Secondary | ICD-10-CM | POA: Diagnosis not present

## 2022-02-17 DIAGNOSIS — R2681 Unsteadiness on feet: Secondary | ICD-10-CM | POA: Diagnosis not present

## 2022-02-17 DIAGNOSIS — I959 Hypotension, unspecified: Secondary | ICD-10-CM | POA: Diagnosis not present

## 2022-02-17 DIAGNOSIS — S199XXA Unspecified injury of neck, initial encounter: Secondary | ICD-10-CM | POA: Diagnosis not present

## 2022-02-17 DIAGNOSIS — S0083XA Contusion of other part of head, initial encounter: Secondary | ICD-10-CM | POA: Diagnosis not present

## 2022-02-17 DIAGNOSIS — W0110XA Fall on same level from slipping, tripping and stumbling with subsequent striking against unspecified object, initial encounter: Secondary | ICD-10-CM | POA: Diagnosis not present

## 2022-02-17 DIAGNOSIS — Z79899 Other long term (current) drug therapy: Secondary | ICD-10-CM | POA: Diagnosis not present

## 2022-02-17 DIAGNOSIS — E041 Nontoxic single thyroid nodule: Secondary | ICD-10-CM | POA: Diagnosis not present

## 2022-02-17 DIAGNOSIS — R55 Syncope and collapse: Secondary | ICD-10-CM | POA: Diagnosis not present

## 2022-02-17 DIAGNOSIS — K219 Gastro-esophageal reflux disease without esophagitis: Secondary | ICD-10-CM | POA: Diagnosis present

## 2022-02-17 DIAGNOSIS — R7989 Other specified abnormal findings of blood chemistry: Secondary | ICD-10-CM | POA: Diagnosis present

## 2022-02-17 DIAGNOSIS — Z87891 Personal history of nicotine dependence: Secondary | ICD-10-CM | POA: Insufficient documentation

## 2022-02-17 DIAGNOSIS — R609 Edema, unspecified: Secondary | ICD-10-CM | POA: Diagnosis not present

## 2022-02-17 DIAGNOSIS — D649 Anemia, unspecified: Secondary | ICD-10-CM | POA: Diagnosis present

## 2022-02-17 DIAGNOSIS — E785 Hyperlipidemia, unspecified: Secondary | ICD-10-CM | POA: Diagnosis present

## 2022-02-17 DIAGNOSIS — E559 Vitamin D deficiency, unspecified: Secondary | ICD-10-CM | POA: Insufficient documentation

## 2022-02-17 DIAGNOSIS — I10 Essential (primary) hypertension: Secondary | ICD-10-CM | POA: Diagnosis not present

## 2022-02-17 DIAGNOSIS — R531 Weakness: Secondary | ICD-10-CM | POA: Diagnosis not present

## 2022-02-17 DIAGNOSIS — I6529 Occlusion and stenosis of unspecified carotid artery: Secondary | ICD-10-CM | POA: Diagnosis not present

## 2022-02-17 DIAGNOSIS — I6381 Other cerebral infarction due to occlusion or stenosis of small artery: Secondary | ICD-10-CM | POA: Diagnosis not present

## 2022-02-17 DIAGNOSIS — R509 Fever, unspecified: Secondary | ICD-10-CM | POA: Insufficient documentation

## 2022-02-17 DIAGNOSIS — I4891 Unspecified atrial fibrillation: Secondary | ICD-10-CM | POA: Diagnosis not present

## 2022-02-17 DIAGNOSIS — R600 Localized edema: Secondary | ICD-10-CM | POA: Insufficient documentation

## 2022-02-17 DIAGNOSIS — Z8673 Personal history of transient ischemic attack (TIA), and cerebral infarction without residual deficits: Secondary | ICD-10-CM | POA: Insufficient documentation

## 2022-02-17 DIAGNOSIS — J984 Other disorders of lung: Secondary | ICD-10-CM | POA: Diagnosis not present

## 2022-02-17 DIAGNOSIS — Z1152 Encounter for screening for COVID-19: Secondary | ICD-10-CM | POA: Diagnosis not present

## 2022-02-17 DIAGNOSIS — F32A Depression, unspecified: Secondary | ICD-10-CM | POA: Diagnosis present

## 2022-02-17 DIAGNOSIS — E43 Unspecified severe protein-calorie malnutrition: Secondary | ICD-10-CM | POA: Diagnosis not present

## 2022-02-17 DIAGNOSIS — R42 Dizziness and giddiness: Secondary | ICD-10-CM | POA: Diagnosis not present

## 2022-02-17 DIAGNOSIS — I5189 Other ill-defined heart diseases: Secondary | ICD-10-CM

## 2022-02-17 LAB — CBC WITH DIFFERENTIAL/PLATELET
Abs Immature Granulocytes: 0.01 K/uL (ref 0.00–0.07)
Basophils Absolute: 0 K/uL (ref 0.0–0.1)
Basophils Relative: 1 %
Eosinophils Absolute: 0.2 K/uL (ref 0.0–0.5)
Eosinophils Relative: 5 %
HCT: 33.6 % — ABNORMAL LOW (ref 39.0–52.0)
Hemoglobin: 10.9 g/dL — ABNORMAL LOW (ref 13.0–17.0)
Immature Granulocytes: 0 %
Lymphocytes Relative: 43 %
Lymphs Abs: 1.5 K/uL (ref 0.7–4.0)
MCH: 29.5 pg (ref 26.0–34.0)
MCHC: 32.4 g/dL (ref 30.0–36.0)
MCV: 91.1 fL (ref 80.0–100.0)
Monocytes Absolute: 0.6 K/uL (ref 0.1–1.0)
Monocytes Relative: 16 %
Neutro Abs: 1.3 K/uL — ABNORMAL LOW (ref 1.7–7.7)
Neutrophils Relative %: 35 %
Platelets: 165 K/uL (ref 150–400)
RBC: 3.69 MIL/uL — ABNORMAL LOW (ref 4.22–5.81)
RDW: 12.4 % (ref 11.5–15.5)
WBC: 3.7 K/uL — ABNORMAL LOW (ref 4.0–10.5)
nRBC: 0 % (ref 0.0–0.2)

## 2022-02-17 LAB — BASIC METABOLIC PANEL WITH GFR
Anion gap: 6 (ref 5–15)
BUN: 23 mg/dL (ref 8–23)
CO2: 28 mmol/L (ref 22–32)
Calcium: 8.6 mg/dL — ABNORMAL LOW (ref 8.9–10.3)
Chloride: 104 mmol/L (ref 98–111)
Creatinine, Ser: 1.03 mg/dL (ref 0.61–1.24)
GFR, Estimated: >60 mL/min
Glucose, Bld: 97 mg/dL (ref 70–99)
Potassium: 3.9 mmol/L (ref 3.5–5.1)
Sodium: 138 mmol/L (ref 135–145)

## 2022-02-17 NOTE — ED Triage Notes (Signed)
Pt reports with increased weakness over the past week and had a fall today hitting his head. Pt has a bruise to his left forehead.

## 2022-02-17 NOTE — ED Provider Triage Note (Signed)
Emergency Medicine Provider Triage Evaluation Note  [name] Good , a 81 y.o. male  was evaluated in triage.  Pt complains of a fall due to weakness upon standing. Struck his head on the floor. No loss of consciousness. Denies chest pain, shortness of breath. No other injury from fall. Bilateral lower extremity edema   Review of Systems  Positive: Scalp hematoma Negative: Loss of consciousness, chest pain, shortness of breath, abdominal pain, extremity pain.  Physical Exam  BP (!) 168/99   Pulse (!) 49   Temp 97.7 F (36.5 C) (Oral)   Resp 13   SpO2 98%  Gen:   Awake, no distress   Resp:  Normal effort  MSK:   Moves extremities without difficulty  Other:  Scalp hematoma. No focal neurologic deficit.  Medical Decision Making  Medically screening exam initiated at 10:43 PM.  Appropriate orders placed.  [name] Good was informed that the remainder of the evaluation will be completed by another provider, this initial triage assessment does not replace that evaluation, and the importance of remaining in the ED until their evaluation is complete.     Etta Quill, NP 02/17/22 [id number]

## 2022-02-18 ENCOUNTER — Inpatient Hospital Stay (HOSPITAL_COMMUNITY): Payer: Medicare HMO

## 2022-02-18 ENCOUNTER — Emergency Department (HOSPITAL_COMMUNITY): Payer: Medicare HMO

## 2022-02-18 DIAGNOSIS — E041 Nontoxic single thyroid nodule: Secondary | ICD-10-CM | POA: Diagnosis not present

## 2022-02-18 DIAGNOSIS — I6529 Occlusion and stenosis of unspecified carotid artery: Secondary | ICD-10-CM | POA: Diagnosis not present

## 2022-02-18 DIAGNOSIS — D649 Anemia, unspecified: Secondary | ICD-10-CM | POA: Diagnosis present

## 2022-02-18 DIAGNOSIS — R0609 Other forms of dyspnea: Secondary | ICD-10-CM | POA: Diagnosis not present

## 2022-02-18 DIAGNOSIS — E049 Nontoxic goiter, unspecified: Secondary | ICD-10-CM | POA: Diagnosis not present

## 2022-02-18 DIAGNOSIS — I6523 Occlusion and stenosis of bilateral carotid arteries: Secondary | ICD-10-CM | POA: Diagnosis not present

## 2022-02-18 DIAGNOSIS — J984 Other disorders of lung: Secondary | ICD-10-CM | POA: Diagnosis not present

## 2022-02-18 DIAGNOSIS — S199XXA Unspecified injury of neck, initial encounter: Secondary | ICD-10-CM | POA: Diagnosis not present

## 2022-02-18 DIAGNOSIS — I5189 Other ill-defined heart diseases: Secondary | ICD-10-CM

## 2022-02-18 DIAGNOSIS — R531 Weakness: Secondary | ICD-10-CM | POA: Diagnosis not present

## 2022-02-18 LAB — HEPATIC FUNCTION PANEL
ALT: 14 U/L (ref 0–44)
AST: 17 U/L (ref 15–41)
Albumin: 3.7 g/dL (ref 3.5–5.0)
Alkaline Phosphatase: 56 U/L (ref 38–126)
Bilirubin, Direct: <0.1 mg/dL (ref 0.0–0.2)
Total Bilirubin: 0.6 mg/dL (ref 0.3–1.2)
Total Protein: 6.1 g/dL — ABNORMAL LOW (ref 6.5–8.1)

## 2022-02-18 LAB — URINALYSIS, ROUTINE W REFLEX MICROSCOPIC
Bilirubin Urine: NEGATIVE
Glucose, UA: NEGATIVE mg/dL
Hgb urine dipstick: NEGATIVE
Ketones, ur: NEGATIVE mg/dL
Leukocytes,Ua: NEGATIVE
Nitrite: NEGATIVE
Protein, ur: NEGATIVE mg/dL
Specific Gravity, Urine: 1.006 (ref 1.005–1.030)
pH: 7 (ref 5.0–8.0)

## 2022-02-18 LAB — ECHOCARDIOGRAM COMPLETE
AR max vel: 1.82 cm2
AV Area VTI: 2.04 cm2
AV Area mean vel: 1.67 cm2
AV Mean grad: 15 mmHg
AV Peak grad: 24.6 mmHg
Ao pk vel: 2.48 m/s
Area-P 1/2: 2.11 cm2
Calc EF: 64.7 %
MV M vel: 2.37 m/s
MV Peak grad: 22.5 mmHg
S' Lateral: 2.5 cm
Single Plane A2C EF: 66.3 %
Single Plane A4C EF: 61.8 %

## 2022-02-18 LAB — BRAIN NATRIURETIC PEPTIDE: B Natriuretic Peptide: 148 pg/mL — ABNORMAL HIGH (ref 0.0–100.0)

## 2022-02-18 LAB — TSH: TSH: 1.313 u[IU]/mL (ref 0.350–4.500)

## 2022-02-18 LAB — RESP PANEL BY RT-PCR (FLU A&B, COVID) ARPGX2
Influenza A by PCR: NEGATIVE
Influenza B by PCR: NEGATIVE
SARS Coronavirus 2 by RT PCR: NEGATIVE

## 2022-02-18 LAB — VITAMIN D 25 HYDROXY (VIT D DEFICIENCY, FRACTURES): Vit D, 25-Hydroxy: 39.34 ng/mL (ref 30–100)

## 2022-02-18 MED ORDER — VITAMIN D 25 MCG (1000 UNIT) PO TABS
5000.0000 [IU] | ORAL_TABLET | Freq: Every day | ORAL | Status: DC
  Administered 2022-02-18 – 2022-02-24 (×7): 5000 [IU] via ORAL
  Filled 2022-02-18 (×7): qty 5

## 2022-02-18 MED ORDER — FESOTERODINE FUMARATE ER 4 MG PO TB24
4.0000 mg | ORAL_TABLET | Freq: Every day | ORAL | Status: DC
  Administered 2022-02-18 – 2022-02-24 (×7): 4 mg via ORAL
  Filled 2022-02-18 (×7): qty 1

## 2022-02-18 MED ORDER — CITALOPRAM HYDROBROMIDE 20 MG PO TABS
10.0000 mg | ORAL_TABLET | Freq: Every day | ORAL | Status: DC
  Administered 2022-02-18 – 2022-02-24 (×7): 10 mg via ORAL
  Filled 2022-02-18 (×7): qty 1

## 2022-02-18 MED ORDER — LISINOPRIL 20 MG PO TABS
20.0000 mg | ORAL_TABLET | Freq: Every day | ORAL | Status: DC
  Administered 2022-02-18 – 2022-02-23 (×6): 20 mg via ORAL
  Filled 2022-02-18 (×4): qty 1
  Filled 2022-02-18: qty 2
  Filled 2022-02-18: qty 1

## 2022-02-18 MED ORDER — DORZOLAMIDE HCL-TIMOLOL MAL 2-0.5 % OP SOLN
1.0000 [drp] | Freq: Two times a day (BID) | OPHTHALMIC | Status: DC
  Administered 2022-02-18 – 2022-02-24 (×12): 1 [drp] via OPHTHALMIC
  Filled 2022-02-18: qty 10

## 2022-02-18 MED ORDER — SODIUM CHLORIDE (PF) 0.9 % IJ SOLN
INTRAMUSCULAR | Status: AC
  Filled 2022-02-18: qty 50

## 2022-02-18 MED ORDER — PANTOPRAZOLE SODIUM 40 MG PO TBEC
40.0000 mg | DELAYED_RELEASE_TABLET | Freq: Every day | ORAL | Status: DC
  Administered 2022-02-18 – 2022-02-24 (×7): 40 mg via ORAL
  Filled 2022-02-18 (×7): qty 1

## 2022-02-18 MED ORDER — INFLUENZA VAC A&B SA ADJ QUAD 0.5 ML IM PRSY
0.5000 mL | PREFILLED_SYRINGE | INTRAMUSCULAR | Status: DC
  Filled 2022-02-18: qty 0.5

## 2022-02-18 NOTE — Progress Notes (Signed)
Carotid artery duplex has been completed. Preliminary results can be found in CV Proc through chart review.   02/18/22 11:57 AM Carlos Levering RVT

## 2022-02-18 NOTE — Progress Notes (Signed)
  Echocardiogram 2D Echocardiogram has been performed.  Lana Fish 02/18/2022, 2:33 PM

## 2022-02-18 NOTE — ED Notes (Signed)
Pt is unable to ambulate. Both this ed tech and another ed tech struggled to help this pt stand. Pt fell forward into the bed and was unable to bare weight.

## 2022-02-18 NOTE — H&P (Signed)
History and Physical    Patient: [name] Good NAT:[id number] DOB: Oct 16, 1940 DOA: 02/17/2022 DOS: the patient was seen and examined on 02/18/2022 PCP: [name], MD  Patient coming from: Home  Chief Complaint:  Chief Complaint  Patient presents with   Weakness   Fall   HPI: [name] Good is a 81 y.o. male who lives at home alone with a past medical history significant of balanitis, cataract, glaucoma, colon polyps, hyperlipidemia, hypertension, diffuse osteoarthritis, history of other nonhemorrhagic lacunar CVA, hyperglycemia, heart murmur, polymyalgia rheumatica, GERD, depression vitamin D deficiency who is coming to the emergency department due to having generalized weakness for the past several weeks associated with a fal yesterday evening.  Per patient, he was sitting in the couch, then as he was trying to get up, he felt lightheaded and fell hitting his head.  There was no LOC.  He was on the floor for about 30 minutes. He denied fever, chills, rhinorrhea, sore throat, wheezing or hemoptysis.  No chest pain, palpitations, diaphoresis, PND, orthopnea or pitting edema of the lower extremities.  No abdominal pain, nausea, emesis, diarrhea, constipation, melena or hematochezia.  No flank pain, dysuria, frequency or hematuria.  No polyuria, polydipsia, polyphagia or blurred vision.   ED course: Initial vital signs were temperature 97.7 F, pulse 49, respirations 18, BP 168/99 mmHg and O2 sat 98% on room air.  Lab work: Urinalysis was normal.  Negative coronavirus and influenza PCR.  CBC showed a white count of 3.7, hemoglobin 10.9 g/dL and platelets 165.  BMP was normal after calcium level was corrected.  Total protein was 6.1 g/dL, the rest of the LFTs were normal.  BNP 148.0 pg/mL.  Imaging: Portable 2 view chest radiograph with no active cardiopulmonary disease.  CT head without contrast with no acute intracranial normality.  There is atrophy with chronic small vessel  ischemic disease and multiple scattered remote lacunar infarcts about bilateral basal ganglia and left cerebellum.  CT cervical spine with osteopenia and degenerative changes.  Retained secretions in the trachea versus aspirate.  Carotic arthrosclerosis.  There was a 2 cm right thyroid nodule with nonemergent follow-up ultrasound recommended.  There was carotic atherosclerosis bilaterally.   Review of Systems: As mentioned in the history of present illness. All other systems reviewed and are negative. Past Medical History:  Diagnosis Date   Balanitis    hx of   Cataract    Glaucoma    Hx of adenomatous polyp of colon 03/09/2014   Hyperlipidemia    Hypertension    Osteoarthritis    diffuse   Stroke [facility])    pt states ? when had   Past Surgical History:  Procedure Laterality Date   CATARACT EXTRACTION     COLONOSCOPY  06-05-2003   hems only   TONSILLECTOMY     Social History:  reports that he quit smoking about 44 years ago. His smoking use included cigarettes. He has a 20.00 pack-year smoking history. He has never been exposed to tobacco smoke. He has never used smokeless tobacco. He reports that he does not drink alcohol and does not use drugs.  Allergies  Allergen Reactions   Gemfibrozil     REACTION: unspecified   Lipitor [Atorvastatin]     Uneasy feeling   Niacin     REACTION: unspecified   Penicillins     REACTION: unspecified   Statins Other (See Comments)    myalgias   Valacyclovir Hcl     REACTION: unspecified  Family History  Problem Relation Age of Onset   Other Mother 58       CVA   Cancer Father        lung   Diabetes Brother    Coronary artery disease Neg Hx    Colon cancer Neg Hx     Prior to Admission medications   Medication Sig Start Date End Date Taking? Authorizing Provider  benzonatate (TESSALON PERLES) 100 MG capsule 1 tab by mouth every 6 hrs as needed for cough 12/02/21   [name], MD  Cholecalciferol (VITAMIN D3) 50 MCG (2000 UT)  TABS Take by mouth.    [provider]  citalopram (CELEXA) 10 MG tablet Take 1 tablet (10 mg total) by mouth daily. 09/01/21 09/01/22  [name], MD  dorzolamide-timolol (COSOPT) 22.3-6.8 MG/ML ophthalmic solution  05/30/19   [provider]  lisinopril (ZESTRIL) 20 MG tablet TAKE 1 TABLET(20 MG) BY MOUTH DAILY 10/17/21   [name], MD  multivitamin [facility]) tablet Take 1 tablet by mouth daily.    [provider]  pantoprazole (PROTONIX) 40 MG tablet Take 1 tablet (40 mg total) by mouth daily. 12/04/21   [name], MD  solifenacin (VESICARE) 5 MG tablet Take 1 tablet (5 mg total) by mouth daily. 01/21/22   [name], MD  traMADol (ULTRAM) 50 MG tablet Take 1 tablet (50 mg total) by mouth every 6 (six) hours as needed. 02/06/22   [name], MD  TURMERIC CURCUMIN PO Take by mouth.    [provider]    Physical Exam: Vitals:   02/18/22 0400 02/18/22 0445 02/18/22 0500 02/18/22 0545  BP: (!) 157/89 128/75  139/79  Pulse: 61 (!) 47  (!) 44  Resp: '18 19  15  '$ Temp:   97.7 F (36.5 C)   TempSrc:      SpO2: 100% 100%  100%   Physical Exam Vitals and nursing note reviewed.  Constitutional:      Appearance: Normal appearance.  HENT:     Head: Normocephalic.     Nose: No rhinorrhea.     Mouth/Throat:     Mouth: Mucous membranes are moist.  Eyes:     General: No scleral icterus.    Pupils: Pupils are equal, round, and reactive to light.  Cardiovascular:     Rate and Rhythm: Regular rhythm. Bradycardia present.  Pulmonary:     Effort: Pulmonary effort is normal.     Breath sounds: Normal breath sounds.  Abdominal:     General: Bowel sounds are normal. There is no distension.     Palpations: Abdomen is soft.     Tenderness: There is no abdominal tenderness. There is no guarding.  Musculoskeletal:     Cervical back: Neck supple.     Right lower leg: Edema present.     Left lower leg: Edema present.  Skin:    General: Skin is warm  and dry.  Neurological:     General: No focal deficit present.     Mental Status: He is alert and oriented to person, place, and time.  Psychiatric:        Mood and Affect: Mood normal.   Data Reviewed:  There are no new results to review at this time.  Echocardiogram today IMPRESSIONS:   1. Left ventricular ejection fraction, by estimation, is 60 to 65%. The  left ventricle has normal function. The left ventricle has no regional  wall motion abnormalities. There is  mild asymmetric left ventricular  hypertrophy of the basal-septal segment.  There is chordal but not valvular systolic anterior motion. Left  ventricular diastolic parameters are consistent with Grade I diastolic  dysfunction (impaired relaxation).   2. Right ventricular systolic function is normal. The right ventricular  size is normal. There is normal pulmonary artery systolic pressure.   3. The mitral valve is normal in structure. No evidence of mitral valve  regurgitation. No evidence of mitral stenosis.   4. The aortic valve is tricuspid. There is moderate calcification of the  aortic valve. Aortic valve regurgitation is not visualized. Mild aortic  valve stenosis. Aortic valve mean gradient measures 15.0 mmHg.   5. Aortic dilatation noted. There is mild dilatation of the aortic root,  measuring 39 mm.   6. The IVC was not visualized.   Assessment and Plan: Principal Problem:   Generalized weakness No clear etiology. Telemetry/inpatient. Consult PT and OT. Check carotid Doppler. Check echocardiogram. Supportive care.  Active Problems:   Grade I diastolic dysfunction No signs of decompensation. Continue lisinopril 20 mg p.o. daily.    Carotid atherosclerosis No significant flow obstruction on carotid Doppler.    Hyperlipidemia Currently not on medical therapy. Patient will discuss it with his primary doctor.    Essential hypertension Continue lisinopril 20 mg p.o. daily. Monitor blood pressure,  renal function electrolytes.    Low vitamin D level Vitamin D level barely normal. Continue/increase vitamin D supplementation.    Depression Continue citalopram 10 mg p.o. daily.    GERD (gastroesophageal reflux disease) Continue pantoprazole 40 mg p.o. daily.    Normocytic anemia Monitor hematocrit and hemoglobin. Transfuse as needed. Follow-up with primary as an outpatient.    Thyroid nodule Nonemergent follow-up US recommended.    Advance Care Planning:   Code Status: Not on file   Consults:   Family Communication:   Severity of Illness: The appropriate patient status for this patient is INPATIENT. Inpatient status is judged to be reasonable and necessary in order to provide the required intensity of service to ensure the patient's safety. The patient's presenting symptoms, physical exam findings, and initial radiographic and laboratory data in the context of their chronic comorbidities is felt to place them at high risk for further clinical deterioration. Furthermore, it is not anticipated that the patient will be medically stable for discharge from the hospital within 2 midnights of admission.   * I certify that at the point of admission it is my clinical judgment that the patient will require inpatient hospital care spanning beyond 2 midnights from the point of admission due to high intensity of service, high risk for further deterioration and high frequency of surveillance required.*  Author: Reubin Milan, MD 02/18/2022 7:09 AM  For on call review [email or web address].   This document was prepared using Dragon voice recognition software and may contain some unintended transcription errors.

## 2022-02-18 NOTE — ED Provider Notes (Signed)
[facility] DEPT Provider Note   CSN: [id number] Arrival date & time: 02/17/22  2208     History  Chief Complaint  Patient presents with   Weakness   Fall    [name] KLOOS is a 81 y.o. male.  The history is provided by the patient, medical records and a relative.  Weakness Fall  [name] Good is a 81 y.o. male who presents to the Emergency Department complaining of fall.  He presents to the ED accompanied by family for evaluation following a fall that happened around 6pm.  He got dizzy and fell.  He lives alone at home and was getting up off the couch and got dizzy and fell, striking his head.  He was on the ground for about 30 minutes before someone was able to assist him.  He does have frequent help at home but is there on his own the majority of the time.  He does not use any assistive devices for ambulation.  He has a history of hypertension.  Family is concerned due to considerable generalized weakness.  No reports of fever, chest pain, shortness of breath.  He does have lower extremity edema that has been present for the last couple of weeks to months.       Home Medications Prior to Admission medications   Medication Sig Start Date End Date Taking? Authorizing Provider  benzonatate (TESSALON PERLES) 100 MG capsule 1 tab by mouth every 6 hrs as needed for cough 12/02/21   [name], MD  Cholecalciferol (VITAMIN D3) 50 MCG (2000 UT) TABS Take by mouth.    [provider]  citalopram (CELEXA) 10 MG tablet Take 1 tablet (10 mg total) by mouth daily. 09/01/21 09/01/22  [name], MD  dorzolamide-timolol (COSOPT) 22.3-6.8 MG/ML ophthalmic solution  05/30/19   [provider]  lisinopril (ZESTRIL) 20 MG tablet TAKE 1 TABLET(20 MG) BY MOUTH DAILY 10/17/21   [name], MD  multivitamin [facility]) tablet Take 1 tablet by mouth daily.    [provider]  pantoprazole (PROTONIX) 40 MG tablet Take 1 tablet (40  mg total) by mouth daily. 12/04/21   [name], MD  solifenacin (VESICARE) 5 MG tablet Take 1 tablet (5 mg total) by mouth daily. 01/21/22   [name], MD  traMADol (ULTRAM) 50 MG tablet Take 1 tablet (50 mg total) by mouth every 6 (six) hours as needed. 02/06/22   [name], MD  TURMERIC CURCUMIN PO Take by mouth.    [provider]      Allergies    Gemfibrozil, Lipitor [atorvastatin], Niacin, Penicillins, Statins, and Valacyclovir hcl    Review of Systems   Review of Systems  Neurological:  Positive for weakness.  All other systems reviewed and are negative.   Physical Exam Updated Vital Signs BP (!) 142/82   Pulse 69   Temp 97.8 F (36.6 C)   Resp 16   SpO2 100%  Physical Exam Vitals and nursing note reviewed.  Constitutional:      Appearance: He is well-developed.  HENT:     Head: Normocephalic and atraumatic.  Cardiovascular:     Rate and Rhythm: Regular rhythm. Bradycardia present.     Heart sounds: Murmur heard.  Pulmonary:     Effort: Pulmonary effort is normal. No respiratory distress.     Breath sounds: Normal breath sounds.  Abdominal:     Palpations: Abdomen is soft.     Tenderness:  There is no abdominal tenderness. There is no guarding or rebound.  Musculoskeletal:        General: No tenderness.     Comments: 2+ pitting edema to BLE  Skin:    General: Skin is warm and dry.  Neurological:     Mental Status: He is alert and oriented to person, place, and time.     Comments: Quiet voice.  Generalized weakness.  Psychiatric:        Behavior: Behavior normal.     ED Results / Procedures / Treatments   Labs (all labs ordered are listed, but only abnormal results are displayed) Labs Reviewed  CBC WITH DIFFERENTIAL/PLATELET - Abnormal; Notable for the following components:      Result Value   WBC 3.7 (*)    RBC 3.69 (*)    Hemoglobin 10.9 (*)    HCT 33.6 (*)    Neutro Abs 1.3 (*)    All other components within normal limits   BASIC METABOLIC PANEL - Abnormal; Notable for the following components:   Calcium 8.6 (*)    All other components within normal limits  URINALYSIS, ROUTINE W REFLEX MICROSCOPIC - Abnormal; Notable for the following components:   Color, Urine STRAW (*)    All other components within normal limits  BRAIN NATRIURETIC PEPTIDE - Abnormal; Notable for the following components:   B Natriuretic Peptide 148.0 (*)    All other components within normal limits  HEPATIC FUNCTION PANEL - Abnormal; Notable for the following components:   Total Protein 6.1 (*)    All other components within normal limits  RESP PANEL BY RT-PCR (FLU A&B, COVID) [id number]  TSH    EKG EKG Interpretation  Date/Time:  Wednesday February 18 2022 03:27:17 EDT Ventricular Rate:  75 PR Interval:    QRS Duration: 98 QT Interval:  453 QTC Calculation: 401 R Axis:   53 Text Interpretation: duplicate Confirmed by Quintella Reichert [phone]) on 02/18/2022 4:03:57 AM  Radiology CT Cervical Spine Wo Contrast  Result Date: 02/18/2022 CLINICAL DATA:  Neck trauma.  Fell yesterday. EXAM: CT CERVICAL SPINE WITHOUT CONTRAST TECHNIQUE: Multidetector CT imaging of the cervical spine was performed without intravenous contrast. Multiplanar CT image reconstructions were also generated. RADIATION DOSE REDUCTION: This exam was performed according to the departmental dose-optimization program which includes automated exposure control, adjustment of the mA and/or kV according to patient size and/or use of iterative reconstruction technique. COMPARISON:  CT cervical spine 05/20/2017 FINDINGS: Alignment: Normal. Skull base and vertebrae: Osteopenia. There is no evidence of fractures or focal bone lesion. Bony ankylosis is seen across the anterior atlantodental joint. There are multiple nuchal ligament calcific bodies scattered between C5 and T2. This was seen previously. Soft tissues and spinal canal: No prevertebral fluid or swelling. No visible canal  hematoma. There are moderate calcifications on the right-greater-than-left in the proximal cervical ICAs. There is a 2 cm heterogeneous nodule inferiorly in the right lobe of the thyroid gland. This was present previously but may be slightly larger today. Disc levels: There is preservation of the normal cervical disc heights apart for slight disc space loss at C3-4 C5-6. Bidirectional osteophytes are present from C3-4 through C6-7, with anterior osteophyte bridging at C3-4, C4-5 and C5-6. Posterior disc osteophyte complexes at C3-4 and C4-5 again encroach on the ventral cord surface somewhat. There are multilevel facet joint spurs and uncinate spurring. Acquired foraminal stenosis is moderate to severe on the left and moderate on the right at C3-4, bilaterally mild-to-moderate C4-5, severe  on the left and moderate to severe on the right C5-6, and mild on the left at C6-7 Upper chest: There are retained secretions in the trachea, moderate amount. Corona reticulated scarring both lung apices. Other: None. IMPRESSION: 1. Osteopenia and degenerative change without evidence of fractures. 2. Retained secretions in the trachea versus aspirate. 3. 2 cm right thyroid nodule. Nonemergent follow-up ultrasound recommended. 4. Carotid atherosclerosis. Electronically Signed   By: Telford Nab M.D.   On: 02/18/2022 05:43   DG Chest 2 View  Result Date: 02/18/2022 CLINICAL DATA:  Weakness, fall EXAM: CHEST - 2 VIEW COMPARISON:  06/03/2021 FINDINGS: Heart and mediastinal contours are within normal limits. No focal opacities or effusions. No acute bony abnormality. IMPRESSION: No active cardiopulmonary disease. Electronically Signed   By: Rolm Baptise M.D.   On: 02/18/2022 02:29   CT Head Wo Contrast  Result Date: 02/18/2022 CLINICAL DATA:  Initial evaluation for acute trauma, fall. EXAM: CT HEAD WITHOUT CONTRAST TECHNIQUE: Contiguous axial images were obtained from the base of the skull through the vertex without  intravenous contrast. RADIATION DOSE REDUCTION: This exam was performed according to the departmental dose-optimization program which includes automated exposure control, adjustment of the mA and/or kV according to patient size and/or use of iterative reconstruction technique. COMPARISON:  Prior CT from 05/20/2017. FINDINGS: Brain: Age-related cerebral atrophy with chronic small vessel ischemic disease. Multiple scattered remote lacunar infarcts present about the bilateral basal ganglia. Few small remote left cerebellar infarcts noted. No acute intracranial hemorrhage. No acute large vessel territory infarct. No mass lesion, mass effect or midline shift. No hydrocephalus or extra-axial fluid collection. Vascular: No abnormal hyperdense vessel. Calcified atherosclerosis present at skull base. Skull: No visible scalp soft tissue abnormality.  Calvarium intact. Sinuses/Orbits: Globes orbital soft tissues demonstrate no acute finding. Paranasal sinuses and mastoid air cells are largely clear. Other: None. IMPRESSION: 1. No acute intracranial abnormality. 2. Age-related cerebral atrophy with chronic small vessel ischemic disease, with multiple scattered remote lacunar infarcts about the bilateral basal ganglia and left cerebellum. Electronically Signed   By: Jeannine Boga M.D.   On: 02/18/2022 00:07    Procedures Procedures    Medications Ordered in ED Medications - No data to display  ED Course/ Medical Decision Making/ A&P                           Medical Decision Making Amount and/or Complexity of Data Reviewed Labs: ordered. Radiology: ordered.  Risk Decision regarding hospitalization.   Patient with history of hypertension here for evaluation of generalized weakness, unable to ambulate following a fall at home.  He has generalized weakness on examination with lower extremity edema.  Labs significant for mild anemia-no reports of bleeding at home.  UA not consistent with UTI.  CT head  demonstrates remote infarcts, no acute abnormality or evidence of trauma.  EKG with sinus bradycardia.  BNP is mildly elevated at 148.  Staff attempted to stand patient and patient is unable to stand with two-person assist.  Given new inability to ambulate, dizziness and fall recommend admission for observation and further work-up.  Patient updated of findings of studies and he is in agreement with treatment plan.       Final Clinical Impression(s) / ED Diagnoses Final diagnoses:  Generalized weakness  Fall, initial encounter  Contusion of other part of head, initial encounter    Rx / DC Orders ED Discharge Orders     None  Quintella Reichert, MD 02/18/22 [phone]

## 2022-02-19 DIAGNOSIS — R531 Weakness: Secondary | ICD-10-CM | POA: Diagnosis not present

## 2022-02-19 DIAGNOSIS — E43 Unspecified severe protein-calorie malnutrition: Secondary | ICD-10-CM | POA: Insufficient documentation

## 2022-02-19 LAB — CBC
HCT: 38.4 % — ABNORMAL LOW (ref 39.0–52.0)
Hemoglobin: 12.7 g/dL — ABNORMAL LOW (ref 13.0–17.0)
MCH: 29.2 pg (ref 26.0–34.0)
MCHC: 33.1 g/dL (ref 30.0–36.0)
MCV: 88.3 fL (ref 80.0–100.0)
Platelets: 192 10*3/uL (ref 150–400)
RBC: 4.35 MIL/uL (ref 4.22–5.81)
RDW: 11.9 % (ref 11.5–15.5)
WBC: 3.4 10*3/uL — ABNORMAL LOW (ref 4.0–10.5)
nRBC: 0 % (ref 0.0–0.2)

## 2022-02-19 LAB — COMPREHENSIVE METABOLIC PANEL
ALT: 14 U/L (ref 0–44)
AST: 20 U/L (ref 15–41)
Albumin: 4 g/dL (ref 3.5–5.0)
Alkaline Phosphatase: 55 U/L (ref 38–126)
Anion gap: 9 (ref 5–15)
BUN: 15 mg/dL (ref 8–23)
CO2: 27 mmol/L (ref 22–32)
Calcium: 9.3 mg/dL (ref 8.9–10.3)
Chloride: 101 mmol/L (ref 98–111)
Creatinine, Ser: 0.85 mg/dL (ref 0.61–1.24)
GFR, Estimated: >60 mL/min (ref >60)
Glucose, Bld: 98 mg/dL (ref 70–99)
Potassium: 3.5 mmol/L (ref 3.5–5.1)
Sodium: 137 mmol/L (ref 135–145)
Total Bilirubin: 1 mg/dL (ref 0.3–1.2)
Total Protein: 6.6 g/dL (ref 6.5–8.1)

## 2022-02-19 LAB — TSH: TSH: 1.018 u[IU]/mL (ref 0.350–4.500)

## 2022-02-19 LAB — AMMONIA: Ammonia: 22 umol/L (ref 9–35)

## 2022-02-19 LAB — VITAMIN B12: Vitamin B-12: 1200 pg/mL — ABNORMAL HIGH (ref 180–914)

## 2022-02-19 MED ORDER — BOOST PLUS PO LIQD
237.0000 mL | Freq: Three times a day (TID) | ORAL | Status: DC
  Administered 2022-02-19 – 2022-02-24 (×15): 237 mL via ORAL
  Filled 2022-02-19 (×16): qty 237

## 2022-02-19 MED ORDER — HYDRALAZINE HCL 25 MG PO TABS: 25.0000 mg | ORAL_TABLET | Freq: Four times a day (QID) | ORAL | Status: DC | PRN

## 2022-02-19 MED ORDER — ADULT MULTIVITAMIN W/MINERALS CH
1.0000 | ORAL_TABLET | Freq: Every day | ORAL | Status: DC
  Administered 2022-02-19 – 2022-02-24 (×6): 1 via ORAL
  Filled 2022-02-19 (×6): qty 1

## 2022-02-19 MED ORDER — INFLUENZA VAC A&B SA ADJ QUAD 0.5 ML IM PRSY: 0.5000 mL | PREFILLED_SYRINGE | INTRAMUSCULAR | Status: DC | PRN

## 2022-02-19 NOTE — Care Management Obs Status (Signed)
[facility] NOTIFICATION   Patient Details  Name: ORENTHAL DEBSKI MRN: [id number] Date of Birth: 1940/07/29   Medicare Observation Status Notification Given:  yes    Rodney Booze, LCSW 02/19/2022, 5:24 PM

## 2022-02-19 NOTE — Care Management CC44 (Signed)
Condition Code 44 Documentation Completed  Patient Details  Name: [name] Good MRN: [id number] Date of Birth: 05/29/1940   Condition Code 44 given:  Yes Patient signature on Condition Code 44 notice:  Yes Documentation of 2 MD's agreement:  Yes Code 44 added to claim:  Yes    Rodney Booze, LCSW 02/19/2022, 5:25 PM

## 2022-02-19 NOTE — Progress Notes (Signed)
Initial Nutrition Assessment  DOCUMENTATION CODES:   Severe malnutrition in context of chronic illness, Underweight  INTERVENTION:   -Boost Plus TID- Each supplement provides 360kcal and 14g protein.     -Multivitamin with minerals daily  -Liberalize diet to regular given severe malnutrition  NUTRITION DIAGNOSIS:   Severe Malnutrition related to chronic illness as evidenced by severe fat depletion, severe muscle depletion, percent weight loss.  GOAL:   Patient will meet greater than or equal to 90% of their needs  MONITOR:   PO intake, Supplement acceptance, Labs, Weight trends, I & O's  REASON FOR ASSESSMENT:   Consult, Malnutrition Screening Tool Assessment of nutrition requirement/status  ASSESSMENT:   81 year old male with history of hyperlipidemia hypertension osteoarthritis lacunar stroke hyperglycemia polymyalgia rheumatica, GERD and depression who lives at home alone admitted with working diagnosis of generalized weakness.  Patient in room, no family at bedside. Pt able to provide history, stutters at baseline. Pt's son called room to talk to patient. Pt and pt's son both report pt has a good appetite at home and eats 3 meals a day. Pt reports he even will have sn afternoon snack most days. For breakfast he will eat 2 boiled eggs with toast or oatmeal. Lunch is provided by [employer]. Dinner consists of spaghetti and meatballs with some vegetables on the side. Pt denies any issues with swallowing or chewing.  Given malnutrition will liberalize diet to regular. Pt prefers Boost to Ensure so will order Boost Plus-chocolate.   Per weight records, pt has lost 13 lbs since 7/31 (9% wt loss  x 3.5 months, significant for time frame).  Medications: Vitamin D  Labs reviewed.  NUTRITION - FOCUSED PHYSICAL EXAM:  Flowsheet Row Most Recent Value  Orbital Region Moderate depletion  Upper Arm Region Severe depletion  Thoracic and Lumbar Region Moderate depletion   Buccal Region Severe depletion  Temple Region Moderate depletion  Clavicle Bone Region Severe depletion  Clavicle and Acromion Bone Region Severe depletion  Scapular Bone Region Severe depletion  Dorsal Hand Severe depletion  Patellar Region Severe depletion  Anterior Thigh Region Severe depletion  Posterior Calf Region Severe depletion  Edema (RD Assessment) None  Hair Reviewed  Eyes Unable to assess  [kept eyes closed, could barely open 1 eye]  Mouth Reviewed  Skin Reviewed  Nails Reviewed       Diet Order:   Diet Order             Diet regular Room service appropriate? No; Fluid consistency: Thin  Diet effective now                   EDUCATION NEEDS:   No education needs have been identified at this time  Skin:  Skin Assessment: Reviewed RN Assessment  Last BM:  PTA  Height:   Ht Readings from Last 1 Encounters:  02/18/22 6' (1.829 m)    Weight:   Wt Readings from Last 1 Encounters:  02/18/22 57.4 kg    BMI:  Body mass index is 17.16 kg/m.  Estimated Nutritional Needs:   Kcal:  2035-5974  Protein:  85-95g  Fluid:  1.9L/day  [name] Bibles, MS, RD, LDN Inpatient Clinical Dietitian Contact information available via Am[employer]

## 2022-02-19 NOTE — TOC Initial Note (Signed)
Transition of Care [facility]) - Initial/Assessment Note    Patient Details  Name: [name] Good MRN: [id number] Date of Birth: [birth year]  Transition of Care [facility]) CM/SW Contact:    Dessa Phi, RN Phone Number: 02/19/2022, 10:52 AM  Clinical Narrative:  PT cons-await recc.                 Expected Discharge Plan: [employer] Barriers to Discharge: Continued Medical Work up   Patient Goals and CMS Choice Patient states their goals for this hospitalization and ongoing recovery are::  (Home) CMS Medicare.gov Compare Post Acute Care list provided to:: Patient Represenative (must comment) Choice offered to / list presented to : Sibling  Expected Discharge Plan and Services Expected Discharge Plan: [place]   Discharge Planning Services: CM Consult Post Acute Care Choice: [place] arrangements for the past 2 months: Single Family Home                                      Prior Living Arrangements/Services Living arrangements for the past 2 months: Single Family Home Lives with:: Self Patient language and need for interpreter reviewed:: Yes Do you feel safe going back to the place where you live?: Yes        Care giver support system in place?: Yes (comment)   Criminal Activity/Legal Involvement Pertinent to Current Situation/Hospitalization: No - Comment as needed  Activities of Daily Living Home Assistive Devices/Equipment: Walker (specify type) ADL Screening (condition at time of admission) Patient's cognitive ability adequate to safely complete daily activities?: No Is the patient deaf or have difficulty hearing?: Yes Does the patient have difficulty seeing, even when wearing glasses/contacts?: No Does the patient have difficulty concentrating, remembering, or making decisions?: Yes Patient able to express need for assistance with ADLs?: Yes Does the patient have difficulty dressing or bathing?:  Yes Independently performs ADLs?: No Communication: Independent Dressing (OT): Independent Is this a change from baseline?: Pre-admission baseline Grooming: Needs assistance Is this a change from baseline?: Pre-admission baseline Feeding: Independent Is this a change from baseline?: Pre-admission baseline Bathing: Needs assistance Is this a change from baseline?: Pre-admission baseline Toileting: Needs assistance Is this a change from baseline?: Pre-admission baseline In/Out Bed: Needs assistance Is this a change from baseline?: Pre-admission baseline Walks in Home: Independent Does the patient have difficulty walking or climbing stairs?: Yes Weakness of Legs: Both Weakness of Arms/Hands: Both  Permission Sought/Granted Permission sought to share information with : Case Manager Permission granted to share information with : Yes, Verbal Permission Granted  Share Information with NAME:  (Case manager)           Emotional Assessment Appearance:: Appears stated age Attitude/Demeanor/Rapport: Gracious Affect (typically observed): Accepting Orientation: : Oriented to Self, Oriented to Place, Oriented to  Time, Oriented to Situation Alcohol / Substance Use: Not Applicable Psych Involvement: No (comment)  Admission diagnosis:  Generalized weakness [R53.1] Fall, initial encounter [W19.XXXA] Contusion of other part of head, initial encounter [S00.83XA] Patient Active Problem List   Diagnosis Date Noted   Grade I diastolic dysfunction 58/12/9831   Normocytic anemia 02/18/2022   Thyroid nodule 02/18/2022   Carotid atherosclerosis 02/18/2022   Blood loss 06/03/2021   Generalized weakness 04/04/2021   Cough 04/04/2021   Nocturia more than twice per night 01/11/2021   Dry skin 09/21/2020   Constipation 01/31/2020   Urinary incontinence, post-void  dribbling 11/06/2019   GERD (gastroesophageal reflux disease) 11/06/2019   Dizziness 06/29/2019   Right inguinal hernia 05/23/2019    Abnormal LFTs 12/30/2018   Low vitamin D level 12/30/2018   Depression 12/30/2018   Weight loss 09/28/2018   Bilateral hearing loss 09/28/2018   Nail problem 06/08/2018   Hyperglycemia 02/25/2018   Heart murmur 08/16/2017   Degenerative cervical spinal stenosis 02/04/2017   Left shoulder pain 01/12/2017   Arthritis of foot, degenerative 07/10/2015   Plantar fasciitis, bilateral 07/03/2015   Hx of adenomatous polyp of colon 03/09/2014   PSA elevation 11/29/2013   Piriformis syndrome of left side 11/28/2013   Primary localized osteoarthrosis, lower leg 11/28/2013   Bladder neck obstruction 11/12/2013   Hammertoe 11/09/2013   Encounter for well adult exam with abnormal findings 10/26/2010   Polymyalgia rheumatica ([place]) 02/26/2009   LACUNAR INFARCTION 11/30/2008   UNSPECIFIED MYALGIA AND MYOSITIS 11/30/2008   Unspecified visual loss 07/19/2008   SKIN LESIONS, MULTIPLE 07/19/2008   Osteoarthritis 07/08/2007   Hyperlipidemia 05/16/2007   Essential hypertension 05/16/2007   PCP:  [name], MD Pharmacy:   [facility] [address], [place] - [place] AT [place] [address] [place] [address] Phone: [phone] Fax: [address] [place] [address] Phone: [phone] Fax: [phone]     Social Determinants of Health (SDOH) Interventions    Readmission Risk Interventions     No data to display

## 2022-02-19 NOTE — Hospital Course (Addendum)
81 year old male with history of hyperlipidemia hypertension osteoarthritis lacunar stroke hyperglycemia polymyalgia rheumatica, GERD and depression who lives at home alone admitted with working diagnosis of generalized weakness  He presented to the ED with complaint of generalized weakness past several weeks, also felt lightheaded and fell hitting his head 10/31 while trying to get up without loss of consciousness. In the ED afebrile, sinus bradycardia 49.BUN normal negative for COVID CBC CMP with mild anemia thrombocytopeniaCXR-no active cardiopulmonary disease. CT head -Age-related cerebral atrophy with chronic small vessel ischemic disease, with multiple scattered remote lacunar infarcts about the bilateral basal ganglia and left cerebellum.CT cervical spine with osteopenia and degenerative changes.  Retained secretions in the trachea versus aspirate.  Carotic arthrosclerosis.  There was a 2 cm right thyroid nodule with nonemergent follow-up ultrasound recommended. chocardiogram carotid duplex -carotid atherosclerosis bilaterally.  Likely multifactorial etiology w/ generalized weakness deconditioning m significant weight loss, grieving his wife's loss.PT OT has worked and advised skilled nursing facility upon discharge.He had episode of hypotension 11/6 needing IV fluid bolus> lisinopril discontinued, renal function checked 11/7 and remained stable.  Awaiting for insurance approval for discharge

## 2022-02-19 NOTE — Progress Notes (Signed)
PROGRESS NOTE CHARELS Good  EUM:[id number] DOB: 1941-02-18 DOA: 02/17/2022 PCP: [name], MD   Brief Narrative/Hospital Course: 81 year old male with history of hyperlipidemia hypertension osteoarthritis lacunar stroke hyperglycemia polymyalgia rheumatica, GERD and depression who lives at home alone admitted with working diagnosis of generalized weakness  He presented to the ED with complaint of generalized weakness past several weeks, also felt lightheaded and fell hitting his head 10/31 while trying to get up without loss of consciousness. In the ED afebrile, bradycardia 49 BUN normal negative for COVID CBC CMP with mild anemia thrombocytopenia CXR-no active cardiopulmonary disease. CT head 2/O-no acute intracranial abnormality.chronic small vessel ischemic disease and multiple scattered remote lacunar infarcts CT cervical spine with osteopenia and degenerative changes.  Retained secretions in the trachea versus aspirate.  Carotic arthrosclerosis.  There was a 2 cm right thyroid nodule with nonemergent follow-up ultrasound recommended.  There was carotic atherosclerosis bilaterally.  Patient was admitted for further work-up     Subjective: Seen and examined patient appears alert awake interactive somewhat slow to respond, appears to have generalized weakness has no specific complaint   Assessment and Plan: Principal Problem:   Generalized weakness Active Problems:   Hyperlipidemia   Essential hypertension   Low vitamin D level   Depression   GERD (gastroesophageal reflux disease)   Grade I diastolic dysfunction   Normocytic anemia   Thyroid nodule   Carotid atherosclerosis   Generalized weakness Fall at home: Suspect multifactorial, deconditioning related.  Currently no evidence of infection-UA unremarkable, chest x-ray 11/1 no acute finding. No focal weakness.  Underwent carotid Doppler echocardiogram.  Echo with EF 60 to 65% G1 DD no RWMA mitral valve normal aortic  valve tricuspid moderate calcification mild dilatation of aortic root.  Carotid duplex unremarkable 1-39% stenosis.Ordered orthostatic vitals, PT OT consulted.  Replacing vitamin D, tsh normal, check B12 level   History of lacunar stroke: Has global weakness neuro nonfocal G1 DD history-stable BNP/euvolemic Carotid atherosclerosis per imaging cardiac duplex unremarkable Hyperlipidemia not on statin-advised up with PCP to resume but not to start given patient is allergic- Hypertension on lisinopril 20 mg-continue and monitor blood pressure Vitamin D insufficiency-started on supplement Depression on Celexa GERD on PPI Thyroid nodule will need follow-up with ultrasound- Low BMI dietitian consulted-likely moderate malnutrition Body mass index is 17.16 kg/m.   DVT prophylaxis: SCDs Start: 02/18/22 0857 Code Status:   Code Status: Full Code Family Communication: plan of care discussed with patient at bedside. Patient status is: Inpatient because of fall generalized weakness and deconditioning Level of care: Telemetry   Dispo: The patient is from: home lives alone reports his sister is the POA            Anticipated disposition: Anticipate skilled nursing facility  Mobility Assessment (last 72 hours)     Mobility Assessment     Row Name 02/19/22 0001 02/18/22 2000 02/18/22 1841       Does patient have an order for bedrest or is patient medically unstable No - Continue assessment No - Continue assessment No - Continue assessment     What is the highest level of mobility based on the progressive mobility assessment? Level 1 (Bedfast) - Unable to balance while sitting on edge of bed Level 1 (Bedfast) - Unable to balance while sitting on edge of bed Level 1 (Bedfast) - Unable to balance while sitting on edge of bed     Is the above level different from baseline mobility prior to current illness? Yes -  Recommend PT order Yes - Recommend PT order Yes - Recommend PT order              Objective: Vitals last 24 hrs: Vitals:   02/19/22 0231 02/19/22 0507 02/19/22 0852 02/19/22 0856  BP: (!) 179/88 (!) 177/115 (!) 156/96 (!) 167/113  Pulse: (!) 48 71 64 71  Resp: 18 18    Temp: 98.6 F (37 C) 98.1 F (36.7 C)    TempSrc:  Oral    SpO2: 100% 100% 100% 100%  Weight:      Height:       Weight change:  Physical Examination: General exam: alert awake, older than stated age HEENT:Oral mucosa moist, Ear/Nose WNL grossly Respiratory system: bilaterally clear BS, no use of accessory muscle Cardiovascular system: S1 & S2 +, No JVD. Gastrointestinal system: Abdomen soft,NT,ND, BS+ Nervous System:Alert, awake, moving extremities with global weakness. Extremities: LE edema neg,distal peripheral pulses palpable.  Skin: No rashes,no icterus. MSK: Normal muscle bulk,tone, power  Medications reviewed:  Scheduled Meds:  cholecalciferol  5,000 Units Oral Daily   citalopram  10 mg Oral Daily   dorzolamide-timolol  1 drop Both Eyes BID   fesoterodine  4 mg Oral Daily   lactose free nutrition  237 mL Oral TID WC   lisinopril  20 mg Oral Daily   multivitamin with minerals  1 tablet Oral Daily   pantoprazole  40 mg Oral Daily   Continuous Infusions:    Diet Order             Diet regular Room service appropriate? No; Fluid consistency: Thin  Diet effective now                   Intake/Output Summary (Last 24 hours) at 02/19/2022 1255 Last data filed at 02/19/2022 1517 Gross per 24 hour  Intake 240 ml  Output 2200 ml  Net -1960 ml   Net IO Since Admission: -1,960 mL [02/19/22 1255]  Wt Readings from Last 3 Encounters:  02/18/22 57.4 kg  11/17/21 63.2 kg  09/01/21 64.4 kg     Unresulted Labs (From admission, onward)    None     Data Reviewed: I have personally reviewed following labs and imaging studies CBC: Recent Labs  Lab 02/17/22 2333 02/19/22 0505  WBC 3.7* 3.4*  NEUTROABS 1.3*  --   HGB 10.9* 12.7*  HCT 33.6* 38.4*  MCV 91.1 88.3  PLT 165  616   Basic Metabolic Panel: Recent Labs  Lab 02/17/22 2333 02/19/22 0505  NA 138 137  K 3.9 3.5  CL 104 101  CO2 28 27  GLUCOSE 97 98  BUN 23 15  CREATININE 1.03 0.85  CALCIUM 8.6* 9.3   GFR: Estimated Creatinine Clearance: 55.3 mL/min (by C-G formula based on SCr of 0.85 mg/dL). Liver Function Tests: Recent Labs  Lab 02/18/22 0215 02/19/22 0505  AST 17 20  ALT 14 14  ALKPHOS 56 55  BILITOT 0.6 1.0  PROT 6.1* 6.6  ALBUMIN 3.7 4.0   Recent Labs    02/18/22 0215  TSH 1.313  Culture/Microbiology No results found for: "SDES", "SPECREQUEST", "CULT", "REPTSTATUS"  Other culture-see note  Radiology Studies: US THYROID  Result Date: 02/19/2022 CLINICAL DATA:  Incidental on CT. EXAM: THYROID ULTRASOUND TECHNIQUE: Ultrasound examination of the thyroid gland and adjacent soft tissues was performed. COMPARISON:  None Available. FINDINGS: Parenchymal Echotexture: Mildly heterogenous Isthmus: 0.4 cm Right lobe: 5.6 x 2.1 x 2.1 cm Left lobe: 5.3 x 2.1 x  1.8 cm _________________________________________________________ Estimated total number of nodules >/= 1 cm: 0 Number of spongiform nodules >/=  2 cm not described below (TR1): 0 Number of mixed cystic and solid nodules >/= 1.5 cm not described below ([place]): 0 _________________________________________________________ No discrete nodules are seen within the thyroid gland. IMPRESSION: Mildly heterogeneous and enlarged thyroid gland. No thyroid nodule identified. Area identified on recent CT imaging likely represented a pseudo nodule. The above is in keeping with the ACR TI-RADS recommendations - J Am Coll Radiol 2017;14:587-595. Electronically Signed   By: Jacqulynn Cadet M.D.   On: 02/19/2022 07:24   ECHOCARDIOGRAM COMPLETE  Result Date: 02/18/2022    ECHOCARDIOGRAM REPORT   Patient Name:   [name] Good [facility] Date of Exam: 02/18/2022 Medical Rec #:  [id number]           Height:       72.0 in Accession #:    [id number]          Weight:        139.4 lb Date of Birth:  June 18, 1940          BSA:          1.828 m Patient Age:    48 years            BP:           167/129 mmHg Patient Gender: M                   HR:           45 bpm. Exam Location:  Inpatient Procedure: 2D Echo Indications:    Dyspnea  History:        Patient has prior history of Echocardiogram examinations, most                 recent 08/27/2017. Risk Factors:Dyslipidemia and Hypertension.  Sonographer:    Harvie Junior Referring Phys: [id number] [place]  Sonographer Comments: Suboptimal parasternal window and no subcostal window. Image acquisition challenging due to respiratory motion. IMPRESSIONS  1. Left ventricular ejection fraction, by estimation, is 60 to 65%. The left ventricle has normal function. The left ventricle has no regional wall motion abnormalities. There is mild asymmetric left ventricular hypertrophy of the basal-septal segment. There is chordal but not valvular systolic anterior motion. Left ventricular diastolic parameters are consistent with Grade I diastolic dysfunction (impaired relaxation).  2. Right ventricular systolic function is normal. The right ventricular size is normal. There is normal pulmonary artery systolic pressure.  3. The mitral valve is normal in structure. No evidence of mitral valve regurgitation. No evidence of mitral stenosis.  4. The aortic valve is tricuspid. There is moderate calcification of the aortic valve. Aortic valve regurgitation is not visualized. Mild aortic valve stenosis. Aortic valve mean gradient measures 15.0 mmHg.  5. Aortic dilatation noted. There is mild dilatation of the aortic root, measuring 39 mm.  6. The IVC was not visualized. FINDINGS  Left Ventricle: Left ventricular ejection fraction, by estimation, is 60 to 65%. The left ventricle has normal function. The left ventricle has no regional wall motion abnormalities. The left ventricular internal cavity size was normal in size. There is  mild asymmetric left  ventricular hypertrophy of the basal-septal segment. Left ventricular diastolic parameters are consistent with Grade I diastolic dysfunction (impaired relaxation). Right Ventricle: The right ventricular size is normal. No increase in right ventricular wall thickness. Right ventricular systolic function is normal. There is normal pulmonary artery systolic pressure. The  tricuspid regurgitant velocity is 2.06 m/s, and  with an assumed right atrial pressure of 3 mmHg, the estimated right ventricular systolic pressure is 71.2 mmHg. Left Atrium: Left atrial size was normal in size. Right Atrium: Right atrial size was normal in size. Pericardium: There is no evidence of pericardial effusion. Mitral Valve: The mitral valve is normal in structure. There is mild calcification of the mitral valve leaflet(s). No evidence of mitral valve regurgitation. No evidence of mitral valve stenosis. Tricuspid Valve: The tricuspid valve is normal in structure. Tricuspid valve regurgitation is trivial. Aortic Valve: The aortic valve is tricuspid. There is moderate calcification of the aortic valve. Aortic valve regurgitation is not visualized. Mild aortic stenosis is present. Aortic valve mean gradient measures 15.0 mmHg. Aortic valve peak gradient measures 24.6 mmHg. Aortic valve area, by VTI measures 2.04 cm. Pulmonic Valve: The pulmonic valve was normal in structure. Pulmonic valve regurgitation is not visualized. Aorta: Aortic dilatation noted. There is mild dilatation of the aortic root, measuring 39 mm. Venous: The inferior vena cava was not well visualized. IAS/Shunts: No atrial level shunt detected by color flow Doppler.  LEFT VENTRICLE PLAX 2D LVIDd:         4.00 cm      Diastology LVIDs:         2.50 cm      LV e' medial:    5.98 cm/s LV PW:         1.10 cm      LV E/e' medial:  13.4 LV IVS:        1.20 cm      LV e' lateral:   6.53 cm/s LVOT diam:     1.90 cm      LV E/e' lateral: 12.3 LV SV:         118 LV SV Index:   64 LVOT  Area:     2.84 cm  LV Volumes (MOD) LV vol d, MOD A2C: 103.0 ml LV vol d, MOD A4C: 99.0 ml LV vol s, MOD A2C: 34.7 ml LV vol s, MOD A4C: 37.8 ml LV SV MOD A2C:     68.3 ml LV SV MOD A4C:     99.0 ml LV SV MOD BP:      66.1 ml RIGHT VENTRICLE RV Basal diam:  3.30 cm RV Mid diam:    3.30 cm TAPSE (M-mode): 2.7 cm LEFT ATRIUM             Index        RIGHT ATRIUM           Index LA diam:        3.20 cm 1.75 cm/m   RA Area:     14.90 cm LA Vol (A2C):   41.0 ml 22.43 ml/m  RA Volume:   36.30 ml  19.86 ml/m LA Vol (A4C):   24.6 ml 13.46 ml/m LA Biplane Vol: 32.2 ml 17.62 ml/m  AORTIC VALVE                     PULMONIC VALVE AV Area (Vmax):    1.82 cm      PV Vmax:       1.21 m/s AV Area (Vmean):   1.67 cm      PV Peak grad:  5.9 mmHg AV Area (VTI):     2.04 cm AV Vmax:           247.75 cm/s AV Vmean:  171.333 cm/s AV VTI:            0.578 m AV Peak Grad:      24.6 mmHg AV Mean Grad:      15.0 mmHg LVOT Vmax:         159.00 cm/s LVOT Vmean:        101.000 cm/s LVOT VTI:          0.415 m LVOT/AV VTI ratio: 0.72  AORTA Ao Root diam: 3.90 cm Ao Asc diam:  3.30 cm MITRAL VALVE                TRICUSPID VALVE MV Area (PHT): 2.11 cm     TR Peak grad:   17.0 mmHg MV Decel Time: 359 msec     TR Vmax:        206.00 cm/s MR Peak grad: 22.5 mmHg MR Vmax:      237.00 cm/s   SHUNTS MV E velocity: 80.10 cm/s   Systemic VTI:  0.42 m MV A velocity: 103.00 cm/s  Systemic Diam: 1.90 cm MV E/A ratio:  0.78 Dalton McleanMD Electronically signed by Franki Monte Signature Date/Time: 02/18/2022/3:00:19 PM    Final    VAS US CAROTID  Result Date: 02/18/2022 Carotid Arterial Duplex Study Patient Name:  KAYDE WAREHIME  Date of Exam:   02/18/2022 Medical Rec #: [id number]            Accession #:    [id number] Date of Birth: 1940-10-01           Patient Gender: M Patient Age:   77 years Exam Location:  [facility] Procedure:      VAS US CAROTID Referring Phys: DAVID ORTIZ  --------------------------------------------------------------------------------  Indications:       Stenosis. Risk Factors:      Hypertension, hyperlipidemia. Comparison Study:  No prior studies. Performing Technologist: Oliver Hum RVT  Examination Guidelines: A complete evaluation includes B-mode imaging, spectral Doppler, color Doppler, and power Doppler as needed of all accessible portions of each vessel. Bilateral testing is considered an integral part of a complete examination. Limited examinations for reoccurring indications may be performed as noted.  Right Carotid Findings: +----------+--------+--------+--------+-----------------------+--------+           PSV cm/sEDV cm/sStenosisPlaque Description     Comments +----------+--------+--------+--------+-----------------------+--------+ CCA Prox  82      15              smooth and heterogenoustortuous +----------+--------+--------+--------+-----------------------+--------+ CCA Distal63      15              smooth and heterogenous         +----------+--------+--------+--------+-----------------------+--------+ ICA Prox  54      12              smooth and heterogenous         +----------+--------+--------+--------+-----------------------+--------+ ICA Distal57      19                                     tortuous +----------+--------+--------+--------+-----------------------+--------+ ECA       76      8                                               +----------+--------+--------+--------+-----------------------+--------+ +----------+--------+-------+--------+-------------------+  PSV cm/sEDV cmsDescribeArm Pressure (mmHG) +----------+--------+-------+--------+-------------------+ [id number]                                         +----------+--------+-------+--------+-------------------+ +---------+--------+--+--------+-+---------+ VertebralPSV cm/s26EDV cm/s9Antegrade  +---------+--------+--+--------+-+---------+  Left Carotid Findings: +----------+--------+--------+--------+-----------------------+--------+           PSV cm/sEDV cm/sStenosisPlaque Description     Comments +----------+--------+--------+--------+-----------------------+--------+ CCA Prox  77      13              smooth and heterogenous         +----------+--------+--------+--------+-----------------------+--------+ CCA Distal86      15              smooth and heterogenous         +----------+--------+--------+--------+-----------------------+--------+ ICA Prox  51      15              smooth and heterogenous         +----------+--------+--------+--------+-----------------------+--------+ ICA Distal50      16                                     tortuous +----------+--------+--------+--------+-----------------------+--------+ ECA       53      9                                               +----------+--------+--------+--------+-----------------------+--------+ +----------+--------+--------+--------+-------------------+           PSV cm/sEDV cm/sDescribeArm Pressure (mmHG) +----------+--------+--------+--------+-------------------+ [id number]                                         +----------+--------+--------+--------+-------------------+ +---------+--------+--+--------+-+---------+ VertebralPSV cm/s32EDV cm/s7Antegrade +---------+--------+--+--------+-+---------+   Summary: Right Carotid: Velocities in the right ICA are consistent with a 1-39% stenosis. Left Carotid: Velocities in the left ICA are consistent with a 1-39% stenosis. Vertebrals: Bilateral vertebral arteries demonstrate antegrade flow. *See table(s) above for measurements and observations.  Electronically signed by Monica Martinez MD on 02/18/2022 at 12:09:12 PM.    Final    CT Cervical Spine Wo Contrast  Result Date: 02/18/2022 CLINICAL DATA:  Neck trauma.  Fell yesterday. EXAM: CT  CERVICAL SPINE WITHOUT CONTRAST TECHNIQUE: Multidetector CT imaging of the cervical spine was performed without intravenous contrast. Multiplanar CT image reconstructions were also generated. RADIATION DOSE REDUCTION: This exam was performed according to the departmental dose-optimization program which includes automated exposure control, adjustment of the mA and/or kV according to patient size and/or use of iterative reconstruction technique. COMPARISON:  CT cervical spine 05/20/2017 FINDINGS: Alignment: Normal. Skull base and vertebrae: Osteopenia. There is no evidence of fractures or focal bone lesion. Bony ankylosis is seen across the anterior atlantodental joint. There are multiple nuchal ligament calcific bodies scattered between C5 and T2. This was seen previously. Soft tissues and spinal canal: No prevertebral fluid or swelling. No visible canal hematoma. There are moderate calcifications on the right-greater-than-left in the proximal cervical ICAs. There is a 2 cm heterogeneous nodule inferiorly in the right lobe of the thyroid gland. This was present previously but may be slightly larger today. Disc levels: There is preservation of  the normal cervical disc heights apart for slight disc space loss at C3-4 C5-6. Bidirectional osteophytes are present from C3-4 through C6-7, with anterior osteophyte bridging at C3-4, C4-5 and C5-6. Posterior disc osteophyte complexes at C3-4 and C4-5 again encroach on the ventral cord surface somewhat. There are multilevel facet joint spurs and uncinate spurring. Acquired foraminal stenosis is moderate to severe on the left and moderate on the right at C3-4, bilaterally mild-to-moderate C4-5, severe on the left and moderate to severe on the right C5-6, and mild on the left at C6-7 Upper chest: There are retained secretions in the trachea, moderate amount. [place]ona reticulated scarring both lung apices. Other: None. IMPRESSION: 1. Osteopenia and degenerative change without  evidence of fractures. 2. Retained secretions in the trachea versus aspirate. 3. 2 cm right thyroid nodule. Nonemergent follow-up ultrasound recommended. 4. Carotid atherosclerosis. Electronically Signed   By: Telford Nab M.D.   On: 02/18/2022 05:43   DG Chest 2 View  Result Date: 02/18/2022 CLINICAL DATA:  Weakness, fall EXAM: CHEST - 2 VIEW COMPARISON:  06/03/2021 FINDINGS: Heart and mediastinal contours are within normal limits. No focal opacities or effusions. No acute bony abnormality. IMPRESSION: No active cardiopulmonary disease. Electronically Signed   By: Rolm Baptise M.D.   On: 02/18/2022 02:29   CT Head Wo Contrast  Result Date: 02/18/2022 CLINICAL DATA:  Initial evaluation for acute trauma, fall. EXAM: CT HEAD WITHOUT CONTRAST TECHNIQUE: Contiguous axial images were obtained from the base of the skull through the vertex without intravenous contrast. RADIATION DOSE REDUCTION: This exam was performed according to the departmental dose-optimization program which includes automated exposure control, adjustment of the mA and/or kV according to patient size and/or use of iterative reconstruction technique. COMPARISON:  Prior CT from 05/20/2017. FINDINGS: Brain: Age-related cerebral atrophy with chronic small vessel ischemic disease. Multiple scattered remote lacunar infarcts present about the bilateral basal ganglia. Few small remote left cerebellar infarcts noted. No acute intracranial hemorrhage. No acute large vessel territory infarct. No mass lesion, mass effect or midline shift. No hydrocephalus or extra-axial fluid collection. Vascular: No abnormal hyperdense vessel. Calcified atherosclerosis present at skull base. Skull: No visible scalp soft tissue abnormality.  Calvarium intact. Sinuses/Orbits: Globes orbital soft tissues demonstrate no acute finding. Paranasal sinuses and mastoid air cells are largely clear. Other: None. IMPRESSION: 1. No acute intracranial abnormality. 2. Age-related  cerebral atrophy with chronic small vessel ischemic disease, with multiple scattered remote lacunar infarcts about the bilateral basal ganglia and left cerebellum. Electronically Signed   By: Jeannine Boga M.D.   On: 02/18/2022 00:07     LOS: 1 day   Antonieta Pert, MD [facility]  02/19/2022, 12:55 PM

## 2022-02-19 NOTE — Care Management Important Message (Signed)
Important Message  Patient Details  Name: [name] Good MRN: [id number] Date of Birth: 01/31/1941   Medicare Important Message Given:   yes     Rodney Booze, LCSW 02/19/2022, 5:24 PM

## 2022-02-20 ENCOUNTER — Telehealth: Payer: Self-pay | Admitting: Internal Medicine

## 2022-02-20 DIAGNOSIS — R531 Weakness: Secondary | ICD-10-CM | POA: Diagnosis not present

## 2022-02-20 MED ORDER — MIRTAZAPINE 7.5 MG PO TABS: 7.5000 mg | ORAL_TABLET | Freq: Every day | ORAL | Status: DC

## 2022-02-20 MED ORDER — BOOST PLUS PO LIQD: 237.0000 mL | Freq: Three times a day (TID) | ORAL | 0 refills | Status: AC

## 2022-02-20 MED ORDER — MIRTAZAPINE 15 MG PO TABS
7.5000 mg | ORAL_TABLET | Freq: Every day | ORAL | Status: DC
  Administered 2022-02-20 – 2022-02-21 (×2): 7.5 mg via ORAL
  Filled 2022-02-20 (×2): qty 1

## 2022-02-20 NOTE — Progress Notes (Signed)
Made on call provider aware that patient's BP was 94/74, which was a big drop from 180/125 from the last set of vitals. Patient was in the bed resting when the BP of 94/74 was taken. Patient was alert and oriented with delayed responses and stuttering, but nothing different from previous shift.

## 2022-02-20 NOTE — Evaluation (Signed)
Physical Therapy Evaluation Patient Details Name: MOHIT ZIRBES MRN: [id number] DOB: [birth year] Today's Date: 02/20/2022  History of Present Illness  81 year old male with history of hyperlipidemia hypertension osteoarthritis lacunar stroke hyperglycemia polymyalgia rheumatica, GERD and depression who lives at home alone admitted with working diagnosis of generalized weakness and a fall  Clinical Impression  Pt admitted with above diagnosis. Pt required mod assist for supine to sit and for sit to stand. In standing he had a significant posterior lean requiring mod to min assist. He was able to take a few pivotal steps with RW from bed to recliner. SNF recommended.  Pt currently with functional limitations due to the deficits listed below (see PT Problem List). Pt will benefit from skilled PT to increase their independence and safety with mobility to allow discharge to the venue listed below.          Recommendations for follow up therapy are one component of a multi-disciplinary discharge planning process, led by the attending physician.  Recommendations may be updated based on patient status, additional functional criteria and insurance authorization.  Follow Up Recommendations Skilled nursing-short term rehab (<3 hours/day) Can patient physically be transported by private vehicle: No    Assistance Recommended at Discharge Frequent or constant Supervision/Assistance  Patient can return home with the following  A lot of help with walking and/or transfers;A lot of help with bathing/dressing/bathroom;Assistance with cooking/housework;Assist for transportation;Help with stairs or ramp for entrance    Equipment Recommendations None recommended by PT  Recommendations for Other Services       Functional Status Assessment Patient has had a recent decline in their functional status and demonstrates the ability to make significant improvements in function in a reasonable and predictable  amount of time.     Precautions / Restrictions Precautions Precautions: Fall Restrictions Weight Bearing Restrictions: No      Mobility  Bed Mobility Overal bed mobility: Needs Assistance Bed Mobility: Supine to Sit     Supine to sit: Mod assist     General bed mobility comments: assist to raise trunk    Transfers Overall transfer level: Needs assistance Equipment used: Rolling walker (2 wheels) Transfers: Sit to/from Stand, Bed to chair/wheelchair/BSC Sit to Stand: Mod assist   Step pivot transfers: Min assist       General transfer comment: assist to power up, min A due to posterior lean (pt able to partially but not fully correct this with verbal/manual cues), pt took a few pivotal steps to the recliner    Ambulation/Gait                  Stairs            Wheelchair Mobility    Modified Rankin (Stroke Patients Only)       Balance Overall balance assessment: Needs assistance Sitting-balance support: Feet supported, No upper extremity supported Sitting balance-Leahy Scale: Fair     Standing balance support: Bilateral upper extremity supported Standing balance-Leahy Scale: Poor Standing balance comment: posterior lean in standing requiring min to mod A, able to partially but not fully correct this with verbal/manual/visual cues                             Pertinent Vitals/Pain Pain Assessment Pain Assessment: No/denies pain    Home Living Family/patient expects to be discharged to:: Private residence Living Arrangements: Alone   Type of Home: House Home Access: Stairs to enter Entrance Stairs-Rails: None  Entrance Stairs-Number of Steps: 2   Home Layout: One level Home Equipment: Advice worker (2 wheels);Cane - single point      Prior Function Prior Level of Function : Needs assist             Mobility Comments: walks without AD, 2 falls in past 6 months ADLs Comments: sister assists with ADLs,  cooking, hires someone to clean     Hand Dominance   Dominant Hand: Right    Extremity/Trunk Assessment   Upper Extremity Assessment Upper Extremity Assessment: Defer to OT evaluation    Lower Extremity Assessment Lower Extremity Assessment: Overall WFL for tasks assessed    Cervical / Trunk Assessment Cervical / Trunk Assessment: Normal  Communication   Communication: No difficulties  Cognition Arousal/Alertness: Awake/alert Behavior During Therapy: Flat affect, WFL for tasks assessed/performed Overall Cognitive Status: Within Functional Limits for tasks assessed                                 General Comments: Oriented x4, able to follow 1-2 step commands with increased processing time        General Comments      Exercises     Assessment/Plan    PT Assessment Patient needs continued PT services  PT Problem List Decreased activity tolerance;Decreased mobility;Decreased balance       PT Treatment Interventions Gait training;Therapeutic exercise;Patient/family education;DME instruction;Balance training;Functional mobility training;Therapeutic activities    PT Goals (Current goals can be found in the Care Plan section)  Acute Rehab PT Goals Patient Stated Goal: to get stronger PT Goal Formulation: With patient Time For Goal Achievement: 03/06/22 Potential to Achieve Goals: Good    Frequency Min 2X/week     Co-evaluation PT/OT/SLP Co-Evaluation/Treatment: Yes Reason for Co-Treatment: Complexity of the patient's impairments (multi-system involvement);For patient/therapist safety;To address functional/ADL transfers;Necessary to address cognition/behavior during functional activity PT goals addressed during session: Mobility/safety with mobility;Balance;Proper use of DME OT goals addressed during session: Strengthening/ROM;ADL's and self-care       AM-PAC PT "6 Clicks" Mobility  Outcome Measure Help needed turning from your back to your side  while in a flat bed without using bedrails?: A Little Help needed moving from lying on your back to sitting on the side of a flat bed without using bedrails?: A Lot Help needed moving to and from a bed to a chair (including a wheelchair)?: A Little Help needed standing up from a chair using your arms (e.g., wheelchair or bedside chair)?: A Lot Help needed to walk in hospital room?: A Lot Help needed climbing 3-5 steps with a railing? : Total 6 Click Score: 13    End of Session Equipment Utilized During Treatment: Gait belt Activity Tolerance: Patient tolerated treatment well;Patient limited by fatigue Patient left: in chair;with chair alarm set;with call bell/phone within reach Nurse Communication: Mobility status PT Visit Diagnosis: Unsteadiness on feet (R26.81);History of falling (Z91.81);Difficulty in walking, not elsewhere classified (R26.2)    Time: 7341-9379 PT Time Calculation (min) (ACUTE ONLY): 18 min   Charges:   PT Evaluation $PT Eval Moderate Complexity: 1 Mod          Philomena Doheny PT 02/20/2022  Acute Rehabilitation Services  Office [phone]

## 2022-02-20 NOTE — Evaluation (Signed)
Occupational Therapy Evaluation Patient Details Name: [name] Good MRN: [id number] DOB: September 19, 1940 Today's Date: 02/20/2022   History of Present Illness 81 year old male with history of hyperlipidemia hypertension osteoarthritis lacunar stroke hyperglycemia polymyalgia rheumatica, GERD and depression who lives at home alone admitted with working diagnosis of generalized weakness and a fall   Clinical Impression   The patient required mod assist for supine to sit, max assist for lower body dressing, and mod assist to stand using a RW. He presented with posterior lean in standing, requiring cues and min-mod assist to correct. He performed a stand-step transfer to the bedside chair using a RW, requiring steadying assist and cues for general transfer technique, including RW advancement & reaching back prior to sitting. Without further OT services, he is at high risk for falls and restricted ADL participation.       Recommendations for follow up therapy are one component of a multi-disciplinary discharge planning process, led by the attending physician.  Recommendations may be updated based on patient status, additional functional criteria and insurance authorization.   Follow Up Recommendations  Skilled nursing-short term rehab (<3 hours/day)    Assistance Recommended at Discharge Frequent or constant Supervision/Assistance  Patient can return home with the following A lot of help with bathing/dressing/bathroom;Direct supervision/assist for medications management;A lot of help with walking and/or transfers;Assistance with cooking/housework;Assist for transportation;Help with stairs or ramp for entrance    Functional Status Assessment  Patient has had a recent decline in their functional status and demonstrates the ability to make significant improvements in function in a reasonable and predictable amount of time.  Equipment Recommendations  None recommended by OT       Precautions /  Restrictions Precautions Precautions: Fall Restrictions Weight Bearing Restrictions: No      Mobility Bed Mobility Overal bed mobility: Needs Assistance Bed Mobility: Supine to Sit     Supine to sit: Mod assist     General bed mobility comments: assist to raise trunk    Transfers Overall transfer level: Needs assistance Equipment used: Rolling walker (2 wheels) Transfers: Sit to/from Stand, Bed to chair/wheelchair/BSC Sit to Stand: Mod assist           General transfer comment: assist to power up, min A due to posterior lean (pt able to partially but not fully correct this with verbal/manual cues), pt took a few pivotal steps to the recliner      Balance Overall balance assessment: Needs assistance, History of Falls         ADL either performed or assessed with clinical judgement   ADL Overall ADL's : Needs assistance/impaired Eating/Feeding: Set up;Sitting Eating/Feeding Details (indicate cue type and reason): based on clinical judgement Grooming: Sitting;Set up Grooming Details (indicate cue type and reason): simulated at chair level         Upper Body Dressing : Minimal assistance   Lower Body Dressing: Maximal assistance       Toileting- Clothing Manipulation and Hygiene: Maximal assistance Toileting - Clothing Manipulation Details (indicate cue type and reason): at bedside commode level, based on clinical judgement              Pertinent Vitals/Pain Pain Assessment Pain Assessment: No/denies pain     Hand Dominance Right   Extremity/Trunk Assessment Upper Extremity Assessment Upper Extremity Assessment: B UE AROM WFL. B UE grip strength 4-/5   Lower Extremity Assessment Lower Extremity Assessment: Overall WFL for tasks assessed       Communication Communication Communication: No difficulties  Cognition   Behavior During Therapy: Flat affect, WFL for tasks assessed/performed          General Comments: Oriented x4, able to  follow 1-2 step commands with increased time                Home Living Family/patient expects to be discharged to:: Private residence Living Arrangements: Alone   Type of Home: House Home Access: Stairs to enter [employer] of Steps: 2 Entrance Stairs-Rails: None Home Layout: One level     Bathroom Shower/Tub: Tub/shower unit;Walk-in shower   Bathroom Toilet: Handicapped height     Home Equipment: Advice worker (2 wheels);Cane - single point          Prior Functioning/Environment Prior Level of Function : Needs assist             Mobility Comments: walks without AD, 2 falls in past 6 months ADLs Comments: sister assists with ADLs, cooking, hires someone to clean        OT Problem List: Decreased strength;Impaired balance (sitting and/or standing);Decreased knowledge of use of DME or AE      OT Treatment/Interventions: Self-care/ADL training;Therapeutic exercise;Therapeutic activities;Neuromuscular education;Energy conservation;DME and/or AE instruction;Visual/perceptual remediation/compensation;Patient/family education;Balance training    OT Goals(Current goals can be found in the care plan section) Acute Rehab OT Goals Patient Stated Goal: he did not specifically state OT Goal Formulation: With patient Time For Goal Achievement: 03/06/22 Potential to Achieve Goals: Good  OT Frequency: Min 2X/week    Co-evaluation PT/OT/SLP Co-Evaluation/Treatment: Yes Reason for Co-Treatment: Complexity of the patient's impairments (multi-system involvement);For patient/therapist safety;To address functional/ADL transfers;Necessary to address cognition/behavior during functional activity PT goals addressed during session: Mobility/safety with mobility;Balance;Proper use of DME OT goals addressed during session: Strengthening/ROM;ADL's and self-care      AM-PAC OT "6 Clicks" Daily Activity     Outcome Measure Help from another person eating  meals?: None Help from another person taking care of personal grooming?: A Little Help from another person toileting, which includes using toliet, bedpan, or urinal?: A Lot Help from another person bathing (including washing, rinsing, drying)?: A Lot Help from another person to put on and taking off regular upper body clothing?: A Little Help from another person to put on and taking off regular lower body clothing?: A Lot 6 Click Score: 16   End of Session Equipment Utilized During Treatment: Rolling walker (2 wheels);Gait belt Nurse Communication: Mobility status  Activity Tolerance: Patient tolerated treatment well Patient left: in chair;with call bell/phone within reach;with chair alarm set  OT Visit Diagnosis: Unsteadiness on feet (R26.81);Muscle weakness (generalized) (M62.81)                Time: 1100-1117 OT Time Calculation (min): 17 min Charges:  OT General Charges $OT Visit: 1 Visit OT Evaluation $OT Eval Moderate Complexity: 1 Mod    Kizer Nobbe L Dicky Boer, OTR/L 02/20/2022, 11:45 AM

## 2022-02-20 NOTE — Plan of Care (Signed)

## 2022-02-20 NOTE — Telephone Encounter (Signed)
PT's sister calls today in regards to PT's recent admission to the hospital. PT had fallen and hit his head this past weekend. PT's sister had called for EMS and they had looked over PT. They believed PT to be fine outside of the swelling in his feet, they then advised for PT to be seen at hospital. PT was admitted on 10/31 and was able to get a room that following day. PT is still in the hospital now.   PT's sister is wanting advice from [place] on how PT and PT's family should proceed.  PT is still dealing with issues walking and exercising. PT's mental state has been down as he is still in the grieving stages of his wife passing. They were wanting to try and go the route of rehabilitation and possible assisted living.  CB: [phone]

## 2022-02-20 NOTE — TOC Progression Note (Signed)
Transition of Care [facility]) - Progression Note    Patient Details  Name: [name] Good MRN: [id number] Date of Birth: January 02, 1941  [facility] CM/SW Contact  Ercel Pepitone, Juliann Pulse, RN Phone Number: 02/20/2022, 3:27 PM  Clinical Narrative: Faxed out await bed offers prior auth.      Expected Discharge Plan: [place] Barriers to Discharge: Continued Medical Work up  Expected Discharge Plan and Services Expected Discharge Plan: [place]   Discharge Planning Services: CM Consult Post Acute Care Choice: [place] arrangements for the past 2 months: Single Family Home Expected Discharge Date: 02/20/22                                     Social Determinants of Health (SDOH) Interventions    Readmission Risk Interventions     No data to display

## 2022-02-20 NOTE — NC FL2 (Signed)
Axis LEVEL OF CARE SCREENING TOOL     IDENTIFICATION  Patient Name: [name] Good Birthdate: 1940-06-20 Sex: male Admission Date (Current Location): 02/17/2022  [facility] [place] Number:  Herbalist and Address:  [facility],  [address], [place]      Provider Number: [id number]  Attending Physician Name and Address:  Antonieta Pert, MD  Relative Name and Phone Number:   Vickii Chafe Smith(sister)[phone])    Current Level of Care: Hospital Recommended Level of Care: [place] Prior Approval Number:    Date Approved/Denied:   PASRR Number:  [id number])  Discharge Plan: SNF    Current Diagnoses: Patient Active Problem List   Diagnosis Date Noted   Protein-calorie malnutrition, severe 02/19/2022   Grade I diastolic dysfunction 66/09/3014   Normocytic anemia 02/18/2022   Thyroid nodule 02/18/2022   Carotid atherosclerosis 02/18/2022   Blood loss 06/03/2021   Generalized weakness 04/04/2021   Cough 04/04/2021   Nocturia more than twice per night 01/11/2021   Dry skin 09/21/2020   Constipation 01/31/2020   Urinary incontinence, post-void dribbling 11/06/2019   GERD (gastroesophageal reflux disease) 11/06/2019   Dizziness 06/29/2019   Right inguinal hernia 05/23/2019   Abnormal LFTs 12/30/2018   Low vitamin D level 12/30/2018   Depression 12/30/2018   Weight loss 09/28/2018   Bilateral hearing loss 09/28/2018   Nail problem 06/08/2018   Hyperglycemia 02/25/2018   Heart murmur 08/16/2017   Degenerative cervical spinal stenosis 02/04/2017   Left shoulder pain 01/12/2017   Arthritis of foot, degenerative 07/10/2015   Plantar fasciitis, bilateral 07/03/2015   Hx of adenomatous polyp of colon 03/09/2014   PSA elevation 11/29/2013   Piriformis syndrome of left side 11/28/2013   Primary localized osteoarthrosis, lower leg 11/28/2013   Bladder neck obstruction 11/12/2013   Hammertoe  11/09/2013   Encounter for well adult exam with abnormal findings 10/26/2010   Polymyalgia rheumatica ([place]) 02/26/2009   LACUNAR INFARCTION 11/30/2008   UNSPECIFIED MYALGIA AND MYOSITIS 11/30/2008   Unspecified visual loss 07/19/2008   SKIN LESIONS, MULTIPLE 07/19/2008   Osteoarthritis 07/08/2007   Hyperlipidemia 05/16/2007   Essential hypertension 05/16/2007    Orientation RESPIRATION BLADDER Height & Weight     Self, Time, Situation, Place  Normal Continent Weight: 57.4 kg Height:  6' (182.9 cm)  BEHAVIORAL SYMPTOMS/MOOD NEUROLOGICAL BOWEL NUTRITION STATUS      Continent Diet (Regular)  AMBULATORY STATUS COMMUNICATION OF NEEDS Skin   Limited Assist Verbally Normal                       Personal Care Assistance Level of Assistance  Bathing, Feeding, Dressing Bathing Assistance: Limited assistance Feeding assistance: Limited assistance Dressing Assistance: Limited assistance     Functional Limitations Info  Sight, Hearing, Speech Sight Info: Adequate Hearing Info: Adequate Speech Info: Adequate    SPECIAL CARE FACTORS FREQUENCY  PT (By licensed PT), OT (By licensed OT)     PT Frequency:  (5x week) OT Frequency:  (5x week)            Contractures Contractures Info: Not present    Additional Factors Info  Code Status, Allergies Code Status Info:  (Full) Allergies Info:  (Lipitor (Atorvastatin), Lopid (Gemfibrozil), Penicillins, Statins, Valtrex (Valacyclovir), Vitamin B3 (Niacin))           Current Medications (02/20/2022):  This is the current hospital active medication list Current Facility-Administered Medications  Medication Dose Route Frequency  Provider Last Rate Last Admin   cholecalciferol (VITAMIN D3) 25 MCG (1000 UNIT) tablet 5,000 Units  5,000 Units Oral Daily Reubin Milan, MD   5,000 Units at 02/20/22 0924   citalopram (CELEXA) tablet 10 mg  10 mg Oral Daily Reubin Milan, MD   10 mg at 02/20/22 0924   dorzolamide-timolol  (COSOPT) 2-0.5 % ophthalmic solution 1 drop  1 drop Both Eyes BID Reubin Milan, MD   1 drop at 02/20/22 4097   fesoterodine (TOVIAZ) tablet 4 mg  4 mg Oral Daily Reubin Milan, MD   4 mg at 02/20/22 3532   hydrALAZINE (APRESOLINE) tablet 25 mg  25 mg Oral Q6H PRN Antonieta Pert, MD       influenza vaccine adjuvanted (FLUAD) injection 0.5 mL  0.5 mL Intramuscular Prior to discharge Kc, Maren Beach, MD       lactose free nutrition (BOOST PLUS) liquid 237 mL  237 mL Oral TID WC Kc, Ramesh, MD   237 mL at 02/20/22 1212   lisinopril (ZESTRIL) tablet 20 mg  20 mg Oral Daily Reubin Milan, MD   20 mg at 02/20/22 9924   mirtazapine (REMERON) tablet 7.5 mg  7.5 mg Oral QHS Kc, Maren Beach, MD       multivitamin with minerals tablet 1 tablet  1 tablet Oral Daily Antonieta Pert, MD   1 tablet at 02/20/22 0924   pantoprazole (PROTONIX) EC tablet 40 mg  40 mg Oral Daily Reubin Milan, MD   40 mg at 02/20/22 2683     Discharge Medications: Please see discharge summary for a list of discharge medications.  Relevant Imaging Results:  Relevant Lab Results:   Additional Information  [phone])  Kamayah Pillay, Juliann Pulse, RN

## 2022-02-20 NOTE — Telephone Encounter (Signed)
Yes, this plan is quite good, and the exact process of this depends on how much he is able to get around even with the Physical Therapy now in the hospital.   He may need counseling or other treatment for grief as well, so keep this in mind, and maybe ask the current attending provider to arrange this.

## 2022-02-20 NOTE — Discharge Summary (Incomplete)
Physician Discharge Summary  MICA [name] Good:[id number] DOB: 11-30-1940 DOA: 02/17/2022  PCP: [name], MD  Admit date: 02/17/2022 Discharge date: 02/23/2022 Recommendations for Outpatient Follow-up:  Follow up with PCP in 1 weeks-call for appointment Please obtain BMP/CBC in one week You have a thyroid nodule that need further follow-up with primary care doctor in 2 to 3 weeks  Discharge Dispo: SNF Discharge Condition: Stable Code Status:   Code Status: Full Code Diet recommendation:  Diet Order             Diet regular Room service appropriate? No; Fluid consistency: Thin  Diet effective now                    Brief/Interim Summary: 81 year old male with history of hyperlipidemia hypertension osteoarthritis lacunar stroke hyperglycemia polymyalgia rheumatica, GERD and depression who lives at home alone admitted with working diagnosis of generalized weakness  He presented to the ED with complaint of generalized weakness past several weeks, also felt lightheaded and fell hitting his head 10/31 while trying to get up without loss of consciousness. In the ED afebrile, bradycardia 49 BUN normal negative for COVID CBC CMP with mild anemia thrombocytopenia CXR-no active cardiopulmonary disease. CT head 2/O-no acute intracranial abnormality.chronic small vessel ischemic disease and multiple scattered remote lacunar infarcts CT cervical spine with osteopenia and degenerative changes.  Retained secretions in the trachea versus aspirate.  Carotic arthrosclerosis.  There was a 2 cm right thyroid nodule with nonemergent follow-up ultrasound recommended.  There was carotic atherosclerosis bilaterally.  Patient was admitted for further work-up Work-up has been unremarkable likely multifactorial etiology with poor oral intake generalized weakness deconditioning grieving his wife's loss.PT OT has worked and advised skilled nursing facility upon discharge  Waiting on  placement. Discharge Diagnoses:  Principal Problem:   Generalized weakness Active Problems:   Hyperlipidemia   Essential hypertension   Low vitamin D level   Depression   GERD (gastroesophageal reflux disease)   Grade I diastolic dysfunction   Normocytic anemia   Thyroid nodule   Carotid atherosclerosis   Protein-calorie malnutrition, severe  Generalized weakness Fall at home: Suspect multifactorial, deconditioning and from passing of his wife and patient is still grieving so far work-up unremarkable, no focal weakness CT head no acute finding echocardiogram carotid duplex unremarkable.  PT OT eval appreciated and recommending skilled nursing facility.  We checked his B12 TSH and vitamin D.  Asked RN to check orthostatic and negative.   History of lacunar stroke: Has global weakness and is nonfocal G1 DD history-stable BNP/euvolemic. Carotid atherosclerosis per imaging cardiac duplex unremarkable Hyperlipidemia not on statin-advised up with PCP to resume but not to start given patient is allergic- Hypertension on lisinopril 20 mg-uncontrolled on diet.  Continue as needed meds.   Vitamin D insufficiency-started on supplement Depression on Celexa, will add low-dose Remeron at bedtime to estimate appetite and help with depression and also grief from his loss. GERD on PPI Thyroid nodule will need follow-up with ultrasound-I added that in the discharge instruction/recommendation Mild low-grade fever no recurrence chest x-ray UA unremarkable.  Nutrition Status: with poor oral intake, encourage and augment diet as below Nutrition Problem: Severe Malnutrition Etiology: chronic illness Signs/Symptoms: severe fat depletion, severe muscle depletion, percent weight loss Interventions: Liberalize Diet, MVI, Boost Plus Consults: ptot Subjective:  Discharge Exam: Vitals:   02/22/22 2036 02/23/22 0427  BP: (!) 154/95 109/76  Pulse: 79 (!) 58  Resp: 17 (!) 21  Temp: 97.8 F (36.6 C)  48  F (36.7 C)  SpO2: 100% 97%   General: Pt is alert, awake, not in acute distress Cardiovascular: RRR, S1/S2 +, no rubs, no gallops Respiratory: CTA bilaterally, no wheezing, no rhonchi Abdominal: Soft, NT, ND, bowel sounds + Extremities: no edema, no cyanosis  Discharge Instructions  Discharge Instructions     Discharge instructions   Complete by: As directed    Please call call MD or return to ER for similar or worsening recurring problem that brought you to hospital or if any fever,nausea/vomiting,abdominal pain, uncontrolled pain, chest pain,  shortness of breath or any other alarming symptoms.  Please follow-up your doctor as instructed in a week time and call the office for appointment.  Please avoid alcohol, smoking, or any other illicit substance and maintain healthy habits including taking your regular medications as prescribed.  You were cared for by a hospitalist during your hospital stay. If you have any questions about your discharge medications or the care you received while you were in the hospital after you are discharged, you can call the unit and ask to speak with the hospitalist on call if the hospitalist that took care of you is not available.  Once you are discharged, your primary care physician will handle any further medical issues. Please note that NO REFILLS for any discharge medications will be authorized once you are discharged, as it is imperative that you return to your primary care physician (or establish a relationship with a primary care physician if you do not have one) for your aftercare needs so that they can reassess your need for medications and monitor your lab values   Increase activity slowly   Complete by: As directed       Allergies as of 02/23/2022       Reactions   Lipitor [atorvastatin] Other (See Comments)   Uneasy feeling Myalgias    Lopid [gemfibrozil]    Unknown reaction   Penicillins Other (See Comments)   Unknown reaction    Statins Other (See Comments)   Myalgias    Valtrex [valacyclovir] Other (See Comments)   Unknown reaction   Vitamin B3 [niacin] Other (See Comments)   Unknown reaction        Medication List     STOP taking these medications    traMADol 50 MG tablet Commonly known as: ULTRAM       TAKE these medications    benzonatate 100 MG capsule Commonly known as: Tessalon Perles 1 tab by mouth every 6 hrs as needed for cough What changed:  how much to take how to take this when to take this reasons to take this additional instructions   Centrum Silver Adult 50+ Tabs Take 1 tablet by mouth daily.   citalopram 10 MG tablet Commonly known as: CeleXA Take 1 tablet (10 mg total) by mouth daily.   dorzolamide-timolol 2-0.5 % ophthalmic solution Commonly known as: COSOPT Place 1 drop into both eyes 2 (two) times daily.   lactose free nutrition Liqd Take 237 mLs by mouth 3 (three) times daily with meals.   lisinopril 20 MG tablet Commonly known as: ZESTRIL TAKE 1 TABLET(20 MG) BY MOUTH DAILY What changed:  how much to take how to take this when to take this additional instructions   pantoprazole 40 MG tablet Commonly known as: PROTONIX Take 1 tablet (40 mg total) by mouth daily.   solifenacin 5 MG tablet Commonly known as: VESIcare Take 1 tablet (5 mg total) by mouth daily.   TURMERIC  CURCUMIN PO Take 1 tablet by mouth daily.   TYLENOL PO Take 1-2 tablets by mouth daily as needed (pain, headache, fever).   VITAMIN D-3 PO Take 1 capsule by mouth daily.        Follow-up Information     [name], MD Follow up in 1 week(s).   Specialties: Internal Medicine, Radiology Contact information: [place] [address] [phone]                Allergies  Allergen Reactions   Lipitor [Atorvastatin] Other (See Comments)    Uneasy feeling Myalgias    Lopid [Gemfibrozil]     Unknown reaction   Penicillins Other (See Comments)     Unknown reaction   Statins Other (See Comments)    Myalgias    Valtrex [Valacyclovir] Other (See Comments)    Unknown reaction   Vitamin B3 [Niacin] Other (See Comments)    Unknown reaction    The results of significant diagnostics from this hospitalization (including imaging, microbiology, ancillary and laboratory) are listed below for reference.    Microbiology: Recent Results (from the past 240 hour(s))  Resp Panel by RT-PCR (Flu A&B, Covid) Anterior Nasal Swab     Status: None   Collection Time: 02/18/22  3:42 AM   Specimen: Anterior Nasal Swab  Result Value Ref Range Status   SARS Coronavirus 2 by RT PCR NEGATIVE NEGATIVE Final    Comment: (NOTE) SARS-CoV-2 target nucleic acids are NOT DETECTED.  The SARS-CoV-2 RNA is generally detectable in upper respiratory specimens during the acute phase of infection. The lowest concentration of SARS-CoV-2 viral copies this assay can detect is 138 copies/mL. A negative result does not preclude SARS-Cov-2 infection and should not be used as the sole basis for treatment or other patient management decisions. A negative result may occur with  improper specimen collection/handling, submission of specimen other than nasopharyngeal swab, presence of viral mutation(s) within the areas targeted by this assay, and inadequate number of viral copies(<138 copies/mL). A negative result must be combined with clinical observations, patient history, and epidemiological information. The expected result is Negative.  Fact Sheet for Patients:  EntrepreneurPulse.com.au  Fact Sheet for Healthcare Providers:  IncredibleEmployment.be  This test is no t yet approved or cleared by the [place] FDA and  has been authorized for detection and/or diagnosis of SARS-CoV-2 by FDA under an Emergency Use Authorization (EUA). This EUA will remain  in effect (meaning this test can be used) for the duration of the COVID-19  declaration under Section 564(b)(1) of the Act, 21 U.S.C.section 360bbb-3(b)(1), unless the authorization is terminated  or revoked sooner.       Influenza A by PCR NEGATIVE NEGATIVE Final   Influenza B by PCR NEGATIVE NEGATIVE Final    Comment: (NOTE) The Xpert Xpress SARS-CoV-2/FLU/RSV plus assay is intended as an aid in the diagnosis of influenza from Nasopharyngeal swab specimens and should not be used as a sole basis for treatment. Nasal washings and aspirates are unacceptable for Xpert Xpress SARS-CoV-2/FLU/RSV testing.  Fact Sheet for Patients: EntrepreneurPulse.com.au  Fact Sheet for Healthcare Providers: IncredibleEmployment.be  This test is not yet approved or cleared by the [place] FDA and has been authorized for detection and/or diagnosis of SARS-CoV-2 by FDA under an Emergency Use Authorization (EUA). This EUA will remain in effect (meaning this test can be used) for the duration of the COVID-19 declaration under Section 564(b)(1) of the Act, 21 U.S.C. section 360bbb-3(b)(1), unless the authorization is  terminated or revoked.  Performed at [facility], [place] [address], [place], [place] [address]     Procedures/Studies: DG Chest Port 1 View  Result Date: 02/23/2022 CLINICAL DATA:  Fever. EXAM: PORTABLE CHEST 1 VIEW COMPARISON:  Chest radiographs 02/18/2022 FINDINGS: The cardiomediastinal silhouette is unchanged with normal heart size. Minimal scarring or atelectasis is noted in the left lung base. No segmental airspace consolidation, edema, sizable pleural effusion, or pneumothorax is identified. No acute osseous abnormality is seen. IMPRESSION: No active disease. Electronically Signed   By: Logan Bores M.D.   On: 02/23/2022 08:36   US THYROID  Result Date: 02/19/2022 CLINICAL DATA:  Incidental on CT. EXAM: THYROID ULTRASOUND TECHNIQUE: Ultrasound examination of the thyroid gland and adjacent soft tissues  was performed. COMPARISON:  None Available. FINDINGS: Parenchymal Echotexture: Mildly heterogenous Isthmus: 0.4 cm Right lobe: 5.6 x 2.1 x 2.1 cm Left lobe: 5.3 x 2.1 x 1.8 cm _________________________________________________________ Estimated total number of nodules >/= 1 cm: 0 Number of spongiform nodules >/=  2 cm not described below (TR1): 0 Number of mixed cystic and solid nodules >/= 1.5 cm not described below (TR2): 0 _________________________________________________________ No discrete nodules are seen within the thyroid gland. IMPRESSION: Mildly heterogeneous and enlarged thyroid gland. No thyroid nodule identified. Area identified on recent CT imaging likely represented a pseudo nodule. The above is in keeping with the ACR TI-RADS recommendations - J Am Coll Radiol 2017;14:587-595. Electronically Signed   By: Jacqulynn Cadet M.D.   On: 02/19/2022 07:24   ECHOCARDIOGRAM COMPLETE  Result Date: 02/18/2022    ECHOCARDIOGRAM REPORT   Patient Name:   [name] Good [facility] Date of Exam: 02/18/2022 Medical Rec #:  [id number]           Height:       72.0 in Accession #:    [id number]          Weight:       139.4 lb Date of Birth:  1940/05/03          BSA:          1.828 m Patient Age:    81 years            BP:           167/129 mmHg Patient Gender: M                   HR:           45 bpm. Exam Location:  Inpatient Procedure: 2D Echo Indications:    Dyspnea  History:        Patient has prior history of Echocardiogram examinations, most                 recent 08/27/2017. Risk Factors:Dyslipidemia and Hypertension.  Sonographer:    Harvie Junior Referring Phys: [id number] Cerulean  Sonographer Comments: Suboptimal parasternal window and no subcostal window. Image acquisition challenging due to respiratory motion. IMPRESSIONS  1. Left ventricular ejection fraction, by estimation, is 60 to 65%. The left ventricle has normal function. The left ventricle has no regional wall motion abnormalities. There is mild  asymmetric left ventricular hypertrophy of the basal-septal segment. There is chordal but not valvular systolic anterior motion. Left ventricular diastolic parameters are consistent with Grade I diastolic dysfunction (impaired relaxation).  2. Right ventricular systolic function is normal. The right ventricular size is normal. There is normal pulmonary artery systolic pressure.  3. The mitral valve is normal in structure. No  evidence of mitral valve regurgitation. No evidence of mitral stenosis.  4. The aortic valve is tricuspid. There is moderate calcification of the aortic valve. Aortic valve regurgitation is not visualized. Mild aortic valve stenosis. Aortic valve mean gradient measures 15.0 mmHg.  5. Aortic dilatation noted. There is mild dilatation of the aortic root, measuring 39 mm.  6. The IVC was not visualized. FINDINGS  Left Ventricle: Left ventricular ejection fraction, by estimation, is 60 to 65%. The left ventricle has normal function. The left ventricle has no regional wall motion abnormalities. The left ventricular internal cavity size was normal in size. There is  mild asymmetric left ventricular hypertrophy of the basal-septal segment. Left ventricular diastolic parameters are consistent with Grade I diastolic dysfunction (impaired relaxation). Right Ventricle: The right ventricular size is normal. No increase in right ventricular wall thickness. Right ventricular systolic function is normal. There is normal pulmonary artery systolic pressure. The tricuspid regurgitant velocity is 2.06 m/s, and  with an assumed right atrial pressure of 3 mmHg, the estimated right ventricular systolic pressure is 54.6 mmHg. Left Atrium: Left atrial size was normal in size. Right Atrium: Right atrial size was normal in size. Pericardium: There is no evidence of pericardial effusion. Mitral Valve: The mitral valve is normal in structure. There is mild calcification of the mitral valve leaflet(s). No evidence of mitral  valve regurgitation. No evidence of mitral valve stenosis. Tricuspid Valve: The tricuspid valve is normal in structure. Tricuspid valve regurgitation is trivial. Aortic Valve: The aortic valve is tricuspid. There is moderate calcification of the aortic valve. Aortic valve regurgitation is not visualized. Mild aortic stenosis is present. Aortic valve mean gradient measures 15.0 mmHg. Aortic valve peak gradient measures 24.6 mmHg. Aortic valve area, by VTI measures 2.04 cm. Pulmonic Valve: The pulmonic valve was normal in structure. Pulmonic valve regurgitation is not visualized. Aorta: Aortic dilatation noted. There is mild dilatation of the aortic root, measuring 39 mm. Venous: The inferior vena cava was not well visualized. IAS/Shunts: No atrial level shunt detected by color flow Doppler.  LEFT VENTRICLE PLAX 2D LVIDd:         4.00 cm      Diastology LVIDs:         2.50 cm      LV e' medial:    5.98 cm/s LV PW:         1.10 cm      LV E/e' medial:  13.4 LV IVS:        1.20 cm      LV e' lateral:   6.53 cm/s LVOT diam:     1.90 cm      LV E/e' lateral: 12.3 LV SV:         118 LV SV Index:   64 LVOT Area:     2.84 cm  LV Volumes (MOD) LV vol d, MOD A2C: 103.0 ml LV vol d, MOD A4C: 99.0 ml LV vol s, MOD A2C: 34.7 ml LV vol s, MOD A4C: 37.8 ml LV SV MOD A2C:     68.3 ml LV SV MOD A4C:     99.0 ml LV SV MOD BP:      66.1 ml RIGHT VENTRICLE RV Basal diam:  3.30 cm RV Mid diam:    3.30 cm TAPSE (M-mode): 2.7 cm LEFT ATRIUM             Index        RIGHT ATRIUM  Index LA diam:        3.20 cm 1.75 cm/m   RA Area:     14.90 cm LA Vol (A2C):   41.0 ml 22.43 ml/m  RA Volume:   36.30 ml  19.86 ml/m LA Vol (A4C):   24.6 ml 13.46 ml/m LA Biplane Vol: 32.2 ml 17.62 ml/m  AORTIC VALVE                     PULMONIC VALVE AV Area (Vmax):    1.82 cm      PV Vmax:       1.21 m/s AV Area (Vmean):   1.67 cm      PV Peak grad:  5.9 mmHg AV Area (VTI):     2.04 cm AV Vmax:           247.75 cm/s AV Vmean:          171.333  cm/s AV VTI:            0.578 m AV Peak Grad:      24.6 mmHg AV Mean Grad:      15.0 mmHg LVOT Vmax:         159.00 cm/s LVOT Vmean:        101.000 cm/s LVOT VTI:          0.415 m LVOT/AV VTI ratio: 0.72  AORTA Ao Root diam: 3.90 cm Ao Asc diam:  3.30 cm MITRAL VALVE                TRICUSPID VALVE MV Area (PHT): 2.11 cm     TR Peak grad:   17.0 mmHg MV Decel Time: 359 msec     TR Vmax:        206.00 cm/s MR Peak grad: 22.5 mmHg MR Vmax:      237.00 cm/s   SHUNTS MV E velocity: 80.10 cm/s   Systemic VTI:  0.42 m MV A velocity: 103.00 cm/s  Systemic Diam: 1.90 cm MV E/A ratio:  0.78 Dalton McleanMD Electronically signed by Franki Monte Signature Date/Time: 02/18/2022/3:00:19 PM    Final    VAS US CAROTID  Result Date: 02/18/2022 Carotid Arterial Duplex Study Patient Name:  ROD MAJERUS  Date of Exam:   02/18/2022 Medical Rec #: [id number]            Accession #:    [id number] Date of Birth: 1940/10/24           Patient Gender: M Patient Age:   50 years Exam Location:  [facility] Procedure:      VAS US CAROTID Referring Phys: DAVID ORTIZ --------------------------------------------------------------------------------  Indications:       Stenosis. Risk Factors:      Hypertension, hyperlipidemia. Comparison Study:  No prior studies. Performing Technologist: Oliver Hum RVT  Examination Guidelines: A complete evaluation includes B-mode imaging, spectral Doppler, color Doppler, and power Doppler as needed of all accessible portions of each vessel. Bilateral testing is considered an integral part of a complete examination. Limited examinations for reoccurring indications may be performed as noted.  Right Carotid Findings: +----------+--------+--------+--------+-----------------------+--------+           PSV cm/sEDV cm/sStenosisPlaque Description     Comments +----------+--------+--------+--------+-----------------------+--------+ CCA Prox  82      15              smooth and  heterogenoustortuous +----------+--------+--------+--------+-----------------------+--------+ CCA Distal63      15  smooth and heterogenous         +----------+--------+--------+--------+-----------------------+--------+ ICA Prox  54      12              smooth and heterogenous         +----------+--------+--------+--------+-----------------------+--------+ ICA Distal57      19                                     tortuous +----------+--------+--------+--------+-----------------------+--------+ ECA       76      8                                               +----------+--------+--------+--------+-----------------------+--------+ +----------+--------+-------+--------+-------------------+           PSV cm/sEDV cmsDescribeArm Pressure (mmHG) +----------+--------+-------+--------+-------------------+ [id number]                                         +----------+--------+-------+--------+-------------------+ +---------+--------+--+--------+-+---------+ VertebralPSV cm/s26EDV cm/s9Antegrade +---------+--------+--+--------+-+---------+  Left Carotid Findings: +----------+--------+--------+--------+-----------------------+--------+           PSV cm/sEDV cm/sStenosisPlaque Description     Comments +----------+--------+--------+--------+-----------------------+--------+ CCA Prox  77      13              smooth and heterogenous         +----------+--------+--------+--------+-----------------------+--------+ CCA Distal86      15              smooth and heterogenous         +----------+--------+--------+--------+-----------------------+--------+ ICA Prox  51      15              smooth and heterogenous         +----------+--------+--------+--------+-----------------------+--------+ ICA Distal50      16                                     tortuous +----------+--------+--------+--------+-----------------------+--------+ ECA        53      9                                               +----------+--------+--------+--------+-----------------------+--------+ +----------+--------+--------+--------+-------------------+           PSV cm/sEDV cm/sDescribeArm Pressure (mmHG) +----------+--------+--------+--------+-------------------+ [id number]                                         +----------+--------+--------+--------+-------------------+ +---------+--------+--+--------+-+---------+ VertebralPSV cm/s32EDV cm/s7Antegrade +---------+--------+--+--------+-+---------+   Summary: Right Carotid: Velocities in the right ICA are consistent with a 1-39% stenosis. Left Carotid: Velocities in the left ICA are consistent with a 1-39% stenosis. Vertebrals: Bilateral vertebral arteries demonstrate antegrade flow. *See table(s) above for measurements and observations.  Electronically signed by Monica Martinez MD on 02/18/2022 at 12:09:12 PM.    Final    CT Cervical Spine Wo Contrast  Result Date: 02/18/2022 CLINICAL DATA:  Neck trauma.  Fell yesterday. EXAM: CT CERVICAL  SPINE WITHOUT CONTRAST TECHNIQUE: Multidetector CT imaging of the cervical spine was performed without intravenous contrast. Multiplanar CT image reconstructions were also generated. RADIATION DOSE REDUCTION: This exam was performed according to the departmental dose-optimization program which includes automated exposure control, adjustment of the mA and/or kV according to patient size and/or use of iterative reconstruction technique. COMPARISON:  CT cervical spine 05/20/2017 FINDINGS: Alignment: Normal. Skull base and vertebrae: Osteopenia. There is no evidence of fractures or focal bone lesion. Bony ankylosis is seen across the anterior atlantodental joint. There are multiple nuchal ligament calcific bodies scattered between C5 and T2. This was seen previously. Soft tissues and spinal canal: No prevertebral fluid or swelling. No visible canal hematoma. There  are moderate calcifications on the right-greater-than-left in the proximal cervical ICAs. There is a 2 cm heterogeneous nodule inferiorly in the right lobe of the thyroid gland. This was present previously but may be slightly larger today. Disc levels: There is preservation of the normal cervical disc heights apart for slight disc space loss at C3-4 C5-6. Bidirectional osteophytes are present from C3-4 through C6-7, with anterior osteophyte bridging at C3-4, C4-5 and C5-6. Posterior disc osteophyte complexes at C3-4 and C4-5 again encroach on the ventral cord surface somewhat. There are multilevel facet joint spurs and uncinate spurring. Acquired foraminal stenosis is moderate to severe on the left and moderate on the right at C3-4, bilaterally mild-to-moderate C4-5, severe on the left and moderate to severe on the right C5-6, and mild on the left at C6-7 Upper chest: There are retained secretions in the trachea, moderate amount. Corona reticulated scarring both lung apices. Other: None. IMPRESSION: 1. Osteopenia and degenerative change without evidence of fractures. 2. Retained secretions in the trachea versus aspirate. 3. 2 cm right thyroid nodule. Nonemergent follow-up ultrasound recommended. 4. Carotid atherosclerosis. Electronically Signed   By: Telford Nab M.D.   On: 02/18/2022 05:43   DG Chest 2 View  Result Date: 02/18/2022 CLINICAL DATA:  Weakness, fall EXAM: CHEST - 2 VIEW COMPARISON:  06/03/2021 FINDINGS: Heart and mediastinal contours are within normal limits. No focal opacities or effusions. No acute bony abnormality. IMPRESSION: No active cardiopulmonary disease. Electronically Signed   By: Rolm Baptise M.D.   On: 02/18/2022 02:29   CT Head Wo Contrast  Result Date: 02/18/2022 CLINICAL DATA:  Initial evaluation for acute trauma, fall. EXAM: CT HEAD WITHOUT CONTRAST TECHNIQUE: Contiguous axial images were obtained from the base of the skull through the vertex without intravenous contrast.  RADIATION DOSE REDUCTION: This exam was performed according to the departmental dose-optimization program which includes automated exposure control, adjustment of the mA and/or kV according to patient size and/or use of iterative reconstruction technique. COMPARISON:  Prior CT from 05/20/2017. FINDINGS: Brain: Age-related cerebral atrophy with chronic small vessel ischemic disease. Multiple scattered remote lacunar infarcts present about the bilateral basal ganglia. Few small remote left cerebellar infarcts noted. No acute intracranial hemorrhage. No acute large vessel territory infarct. No mass lesion, mass effect or midline shift. No hydrocephalus or extra-axial fluid collection. Vascular: No abnormal hyperdense vessel. Calcified atherosclerosis present at skull base. Skull: No visible scalp soft tissue abnormality.  Calvarium intact. Sinuses/Orbits: Globes orbital soft tissues demonstrate no acute finding. Paranasal sinuses and mastoid air cells are largely clear. Other: None. IMPRESSION: 1. No acute intracranial abnormality. 2. Age-related cerebral atrophy with chronic small vessel ischemic disease, with multiple scattered remote lacunar infarcts about the bilateral basal ganglia and left cerebellum. Electronically Signed   By: Pincus Badder.D.  On: 02/18/2022 00:07    Labs: BNP (last 3 results) Recent Labs    02/18/22 0215  BNP 782.4*   Basic Metabolic Panel: Recent Labs  Lab 02/17/22 2333 02/19/22 0505  NA 138 137  K 3.9 3.5  CL 104 101  CO2 28 27  GLUCOSE 97 98  BUN 23 15  CREATININE 1.03 0.85  CALCIUM 8.6* 9.3   Liver Function Tests: Recent Labs  Lab 02/18/22 0215 02/19/22 0505  AST 17 20  ALT 14 14  ALKPHOS 56 55  BILITOT 0.6 1.0  PROT 6.1* 6.6  ALBUMIN 3.7 4.0   No results for input(s): "LIPASE", "AMYLASE" in the last 168 hours. Recent Labs  Lab 02/19/22 1357  AMMONIA 22   CBC: Recent Labs  Lab 02/17/22 2333 02/19/22 0505  WBC 3.7* 3.4*  NEUTROABS  1.3*  --   HGB 10.9* 12.7*  HCT 33.6* 38.4*  MCV 91.1 88.3  PLT 165 192  Urinalysis    Component Value Date/Time   COLORURINE STRAW (A) 02/23/2022 0742   APPEARANCEUR CLEAR 02/23/2022 0742   LABSPEC 1.009 02/23/2022 0742   PHURINE 6.0 02/23/2022 0742   GLUCOSEU NEGATIVE 02/23/2022 0742   GLUCOSEU NEGATIVE 06/03/2021 1448   HGBUR NEGATIVE 02/23/2022 0742   BILIRUBINUR NEGATIVE 02/23/2022 0742   KETONESUR NEGATIVE 02/23/2022 0742   PROTEINUR NEGATIVE 02/23/2022 0742   UROBILINOGEN 0.2 06/03/2021 1448   NITRITE NEGATIVE 02/23/2022 0742   LEUKOCYTESUR NEGATIVE 02/23/2022 0742  Sepsis Labs Recent Labs  Lab 02/17/22 2333 02/19/22 0505  WBC 3.7* 3.4*  Microbiology Recent Results (from the past 240 hour(s))  Resp Panel by RT-PCR (Flu A&B, Covid) Anterior Nasal Swab     Status: None   Collection Time: 02/18/22  3:42 AM   Specimen: Anterior Nasal Swab  Result Value Ref Range Status   SARS Coronavirus 2 by RT PCR NEGATIVE NEGATIVE Final    Comment: (NOTE) SARS-CoV-2 target nucleic acids are NOT DETECTED.  The SARS-CoV-2 RNA is generally detectable in upper respiratory specimens during the acute phase of infection. The lowest concentration of SARS-CoV-2 viral copies this assay can detect is 138 copies/mL. A negative result does not preclude SARS-Cov-2 infection and should not be used as the sole basis for treatment or other patient management decisions. A negative result may occur with  improper specimen collection/handling, submission of specimen other than nasopharyngeal swab, presence of viral mutation(s) within the areas targeted by this assay, and inadequate number of viral copies(<138 copies/mL). A negative result must be combined with clinical observations, patient history, and epidemiological information. The expected result is Negative.  Fact Sheet for Patients:  EntrepreneurPulse.com.au  Fact Sheet for Healthcare Providers:   IncredibleEmployment.be  This test is no t yet approved or cleared by the [place] FDA and  has been authorized for detection and/or diagnosis of SARS-CoV-2 by FDA under an Emergency Use Authorization (EUA). This EUA will remain  in effect (meaning this test can be used) for the duration of the COVID-19 declaration under Section 564(b)(1) of the Act, 21 U.S.C.section 360bbb-3(b)(1), unless the authorization is terminated  or revoked sooner.       Influenza A by PCR NEGATIVE NEGATIVE Final   Influenza B by PCR NEGATIVE NEGATIVE Final    Comment: (NOTE) The Xpert Xpress SARS-CoV-2/FLU/RSV plus assay is intended as an aid in the diagnosis of influenza from Nasopharyngeal swab specimens and should not be used as a sole basis for treatment. Nasal washings and aspirates are unacceptable for Xpert Xpress SARS-CoV-2/FLU/RSV testing.  Fact Sheet for Patients: EntrepreneurPulse.com.au  Fact Sheet for Healthcare Providers: IncredibleEmployment.be  This test is not yet approved or cleared by the [place] FDA and has been authorized for detection and/or diagnosis of SARS-CoV-2 by FDA under an Emergency Use Authorization (EUA). This EUA will remain in effect (meaning this test can be used) for the duration of the COVID-19 declaration under Section 564(b)(1) of the Act, 21 U.S.C. section 360bbb-3(b)(1), unless the authorization is terminated or revoked.  Performed at [facility], [place] [address], [place], [place] [address]   Time coordinating discharge: 25 minutes SIGNED: Antonieta Pert, MD  [facility] 02/23/2022, 11:30 AM  If 7PM-7AM, please contact night-coverage [email or web address]

## 2022-02-20 NOTE — Progress Notes (Addendum)
PROGRESS NOTE [name] Good  YKZ:[id number] DOB: [birth year] DOA: 02/17/2022 PCP: [name], MD   Brief Narrative/Hospital Course: 81 year old male with history of hyperlipidemia hypertension osteoarthritis lacunar stroke hyperglycemia polymyalgia rheumatica, GERD and depression who lives at home alone admitted with working diagnosis of generalized weakness  He presented to the ED with complaint of generalized weakness past several weeks, also felt lightheaded and fell hitting his head 10/31 while trying to get up without loss of consciousness. In the ED afebrile, bradycardia 49 BUN normal negative for COVID CBC CMP with mild anemia thrombocytopenia CXR-no active cardiopulmonary disease. CT head 2/O-no acute intracranial abnormality.chronic small vessel ischemic disease and multiple scattered remote lacunar infarcts CT cervical spine with osteopenia and degenerative changes.  Retained secretions in the trachea versus aspirate.  Carotic arthrosclerosis.  There was a 2 cm right thyroid nodule with nonemergent follow-up ultrasound recommended.  There was carotic atherosclerosis bilaterally.  Patient was admitted for further work-up Work-up has been unremarkable likely multifactorial etiology with poor oral intake generalized weakness deconditioning grieving his wife's loss.PT OT has worked and advised skilled nursing facility upon discharge   Subjective: Seen and examined this morning he is alert awake oriented x3 feels hungry and nursing staff feeding him.     Assessment and Plan: Principal Problem:   Generalized weakness Active Problems:   Hyperlipidemia   Essential hypertension   Low vitamin D level   Depression   GERD (gastroesophageal reflux disease)   Grade I diastolic dysfunction   Normocytic anemia   Thyroid nodule   Carotid atherosclerosis   Protein-calorie malnutrition, severe   Generalized weakness Fall at home: Suspect multifactorial, deconditioning and from  passing of his wife and patient is still grieving so far work-up unremarkable, no focal weakness CT head no acute finding echocardiogram carotid duplex unremarkable.  PT OT eval appreciated and recommending skilled nursing facility.  We checked his B12 TSH and vitamin D.  Asked RN to check orthostatic   History of lacunar stroke: Has global weakness and is nonfocal G1 DD history-stable BNP/euvolemic. Carotid atherosclerosis per imaging cardiac duplex unremarkable Hyperlipidemia not on statin-advised up with PCP to resume but not to start given patient is allergic- Hypertension on lisinopril 20 mg-uncontrolled on diet.  Continue as needed meds.   Vitamin D insufficiency-started on supplement Depression on Celexa, will add low-dose Remeron at bedtime to estimate appetite and help with depression and also grief from his loss. GERD on PPI Thyroid nodule will need follow-up with ultrasound- Nutrition Status: with poor oral intake, encourage and augment diet as below Nutrition Problem: Severe Malnutrition Etiology: chronic illness Signs/Symptoms: severe fat depletion, severe muscle depletion, percent weight loss Interventions: Liberalize Diet, MVI, Boost Plus   DVT prophylaxis: Place and maintain sequential compression device Start: 02/19/22 1640 SCDs Start: 02/18/22 0857 Code Status:   Code Status: Full Code Family Communication: plan of care discussed with patient at bedside. Patient status is: Remains observation status  Level of care: Telemetry  Dispo: The patient is from: home lives alone reports his sister is the POA            Anticipated disposition: Anticipate skilled nursing facility  Mobility Assessment (last 72 hours)     Mobility Assessment     Row Name 02/20/22 1136 02/20/22 1100 02/19/22 2100 02/19/22 0001 02/18/22 2000   Does patient have an order for bedrest or is patient medically unstable -- -- No - Continue assessment No - Continue assessment No - Continue assessment    What  is the highest level of mobility based on the progressive mobility assessment? Level 3 (Stands with assist) - Balance while standing  and cannot march in place Level 3 (Stands with assist) - Balance while standing  and cannot march in place Level 1 (Bedfast) - Unable to balance while sitting on edge of bed Level 1 (Bedfast) - Unable to balance while sitting on edge of bed Level 1 (Bedfast) - Unable to balance while sitting on edge of bed   Is the above level different from baseline mobility prior to current illness? -- -- Yes - Recommend PT order Yes - Recommend PT order Yes - Recommend PT order    Row Name 02/18/22 1841           Does patient have an order for bedrest or is patient medically unstable No - Continue assessment       What is the highest level of mobility based on the progressive mobility assessment? Level 1 (Bedfast) - Unable to balance while sitting on edge of bed       Is the above level different from baseline mobility prior to current illness? Yes - Recommend PT order               Objective: Vitals last 24 hrs: Vitals:   02/19/22 0856 02/19/22 1409 02/19/22 2026 02/20/22 0603  BP: (!) 167/113 (!) 180/125 94/74 126/66  Pulse: 71 73 65 (!) 56  Resp:  '18 18 17  '$ Temp:  98 F (36.7 C) 97.8 F (36.6 C) 98 F (36.7 C)  TempSrc:   Oral Oral  SpO2: 100% 100% 100% 100%  Weight:      Height:       Weight change:  Physical Examination: General exam: AAox3, weak,older appearing HEENT:Oral mucosa moist, Ear/Nose WNL grossly, dentition normal. Respiratory system: bilaterally clearBS, no use of accessory muscle Cardiovascular system: S1 & S2 +, regular rate. Gastrointestinal system: Abdomen soft, NT,ND,BS+ Nervous System:Alert, awake, moving extremities and grossly nonfocal Extremities: LE ankle edema improving, lower extremities warm Skin: No rashes,no icterus. MSK: Normal muscle bulk,tone, power   Medications reviewed:  Scheduled Meds:  cholecalciferol  5,000  Units Oral Daily   citalopram  10 mg Oral Daily   dorzolamide-timolol  1 drop Both Eyes BID   fesoterodine  4 mg Oral Daily   lactose free nutrition  237 mL Oral TID WC   lisinopril  20 mg Oral Daily   multivitamin with minerals  1 tablet Oral Daily   pantoprazole  40 mg Oral Daily   Continuous Infusions:    Diet Order             Diet regular Room service appropriate? No; Fluid consistency: Thin  Diet effective now                   Intake/Output Summary (Last 24 hours) at 02/20/2022 1145 Last data filed at 02/20/2022 1000 Gross per 24 hour  Intake 720 ml  Output 350 ml  Net 370 ml    Net IO Since Admission: -1,590 mL [02/20/22 1145]  Wt Readings from Last 3 Encounters:  02/18/22 57.4 kg  11/17/21 63.2 kg  09/01/21 64.4 kg     Unresulted Labs (From admission, onward)    None     Data Reviewed: I have personally reviewed following labs and imaging studies CBC: Recent Labs  Lab 02/17/22 2333 02/19/22 0505  WBC 3.7* 3.4*  NEUTROABS 1.3*  --   HGB 10.9* 12.7*  HCT 33.6*  38.4*  MCV 91.1 88.3  PLT 165 580    Basic Metabolic Panel: Recent Labs  Lab 02/17/22 2333 02/19/22 0505  NA 138 137  K 3.9 3.5  CL 104 101  CO2 28 27  GLUCOSE 97 98  BUN 23 15  CREATININE 1.03 0.85  CALCIUM 8.6* 9.3    GFR: Estimated Creatinine Clearance: 55.3 mL/min (by C-G formula based on SCr of 0.85 mg/dL). Liver Function Tests: Recent Labs  Lab 02/18/22 0215 02/19/22 0505  AST 17 20  ALT 14 14  ALKPHOS 56 55  BILITOT 0.6 1.0  PROT 6.1* 6.6  ALBUMIN 3.7 4.0    Recent Labs    02/19/22 1357  TSH 1.018   Culture/Microbiology No results found for: "SDES", "SPECREQUEST", "CULT", "REPTSTATUS"  Other culture-see note  Radiology Studies: US THYROID  Result Date: 02/19/2022 CLINICAL DATA:  Incidental on CT. EXAM: THYROID ULTRASOUND TECHNIQUE: Ultrasound examination of the thyroid gland and adjacent soft tissues was performed. COMPARISON:  None Available.  FINDINGS: Parenchymal Echotexture: Mildly heterogenous Isthmus: 0.4 cm Right lobe: 5.6 x 2.1 x 2.1 cm Left lobe: 5.3 x 2.1 x 1.8 cm _________________________________________________________ Estimated total number of nodules >/= 1 cm: 0 Number of spongiform nodules >/=  2 cm not described below (TR1): 0 Number of mixed cystic and solid nodules >/= 1.5 cm not described below (TR2): 0 _________________________________________________________ No discrete nodules are seen within the thyroid gland. IMPRESSION: Mildly heterogeneous and enlarged thyroid gland. No thyroid nodule identified. Area identified on recent CT imaging likely represented a pseudo nodule. The above is in keeping with the ACR TI-RADS recommendations - J Am Coll Radiol 2017;14:587-595. Electronically Signed   By: Jacqulynn Cadet M.D.   On: 02/19/2022 07:24   ECHOCARDIOGRAM COMPLETE  Result Date: 02/18/2022    ECHOCARDIOGRAM REPORT   Patient Name:   [name] Good [facility] Date of Exam: 02/18/2022 Medical Rec #:  [id number]           Height:       72.0 in Accession #:    [id number]          Weight:       139.4 lb Date of Birth:  Jun 25, 1940          BSA:          1.828 m Patient Age:    25 years            BP:           167/129 mmHg Patient Gender: M                   HR:           45 bpm. Exam Location:  Inpatient Procedure: 2D Echo Indications:    Dyspnea  History:        Patient has prior history of Echocardiogram examinations, most                 recent 08/27/2017. Risk Factors:Dyslipidemia and Hypertension.  Sonographer:    Harvie Junior Referring Phys: [id number] [place]  Sonographer Comments: Suboptimal parasternal window and no subcostal window. Image acquisition challenging due to respiratory motion. IMPRESSIONS  1. Left ventricular ejection fraction, by estimation, is 60 to 65%. The left ventricle has normal function. The left ventricle has no regional wall motion abnormalities. There is mild asymmetric left ventricular hypertrophy of  the basal-septal segment. There is chordal but not valvular systolic anterior motion. Left ventricular diastolic parameters are consistent with Grade I  diastolic dysfunction (impaired relaxation).  2. Right ventricular systolic function is normal. The right ventricular size is normal. There is normal pulmonary artery systolic pressure.  3. The mitral valve is normal in structure. No evidence of mitral valve regurgitation. No evidence of mitral stenosis.  4. The aortic valve is tricuspid. There is moderate calcification of the aortic valve. Aortic valve regurgitation is not visualized. Mild aortic valve stenosis. Aortic valve mean gradient measures 15.0 mmHg.  5. Aortic dilatation noted. There is mild dilatation of the aortic root, measuring 39 mm.  6. The IVC was not visualized. FINDINGS  Left Ventricle: Left ventricular ejection fraction, by estimation, is 60 to 65%. The left ventricle has normal function. The left ventricle has no regional wall motion abnormalities. The left ventricular internal cavity size was normal in size. There is  mild asymmetric left ventricular hypertrophy of the basal-septal segment. Left ventricular diastolic parameters are consistent with Grade I diastolic dysfunction (impaired relaxation). Right Ventricle: The right ventricular size is normal. No increase in right ventricular wall thickness. Right ventricular systolic function is normal. There is normal pulmonary artery systolic pressure. The tricuspid regurgitant velocity is 2.06 m/s, and  with an assumed right atrial pressure of 3 mmHg, the estimated right ventricular systolic pressure is 09.2 mmHg. Left Atrium: Left atrial size was normal in size. Right Atrium: Right atrial size was normal in size. Pericardium: There is no evidence of pericardial effusion. Mitral Valve: The mitral valve is normal in structure. There is mild calcification of the mitral valve leaflet(s). No evidence of mitral valve regurgitation. No evidence of mitral  valve stenosis. Tricuspid Valve: The tricuspid valve is normal in structure. Tricuspid valve regurgitation is trivial. Aortic Valve: The aortic valve is tricuspid. There is moderate calcification of the aortic valve. Aortic valve regurgitation is not visualized. Mild aortic stenosis is present. Aortic valve mean gradient measures 15.0 mmHg. Aortic valve peak gradient measures 24.6 mmHg. Aortic valve area, by VTI measures 2.04 cm. Pulmonic Valve: The pulmonic valve was normal in structure. Pulmonic valve regurgitation is not visualized. Aorta: Aortic dilatation noted. There is mild dilatation of the aortic root, measuring 39 mm. Venous: The inferior vena cava was not well visualized. IAS/Shunts: No atrial level shunt detected by color flow Doppler.  LEFT VENTRICLE PLAX 2D LVIDd:         4.00 cm      Diastology LVIDs:         2.50 cm      LV e' medial:    5.98 cm/s LV PW:         1.10 cm      LV E/e' medial:  13.4 LV IVS:        1.20 cm      LV e' lateral:   6.53 cm/s LVOT diam:     1.90 cm      LV E/e' lateral: 12.3 LV SV:         118 LV SV Index:   64 LVOT Area:     2.84 cm  LV Volumes (MOD) LV vol d, MOD A2C: 103.0 ml LV vol d, MOD A4C: 99.0 ml LV vol s, MOD A2C: 34.7 ml LV vol s, MOD A4C: 37.8 ml LV SV MOD A2C:     68.3 ml LV SV MOD A4C:     99.0 ml LV SV MOD BP:      66.1 ml RIGHT VENTRICLE RV Basal diam:  3.30 cm RV Mid diam:    3.30 cm TAPSE (  M-mode): 2.7 cm LEFT ATRIUM             Index        RIGHT ATRIUM           Index LA diam:        3.20 cm 1.75 cm/m   RA Area:     14.90 cm LA Vol (A2C):   41.0 ml 22.43 ml/m  RA Volume:   36.30 ml  19.86 ml/m LA Vol (A4C):   24.6 ml 13.46 ml/m LA Biplane Vol: 32.2 ml 17.62 ml/m  AORTIC VALVE                     PULMONIC VALVE AV Area (Vmax):    1.82 cm      PV Vmax:       1.21 m/s AV Area (Vmean):   1.67 cm      PV Peak grad:  5.9 mmHg AV Area (VTI):     2.04 cm AV Vmax:           247.75 cm/s AV Vmean:          171.333 cm/s AV VTI:            0.578 m AV Peak  Grad:      24.6 mmHg AV Mean Grad:      15.0 mmHg LVOT Vmax:         159.00 cm/s LVOT Vmean:        101.000 cm/s LVOT VTI:          0.415 m LVOT/AV VTI ratio: 0.72  AORTA Ao Root diam: 3.90 cm Ao Asc diam:  3.30 cm MITRAL VALVE                TRICUSPID VALVE MV Area (PHT): 2.11 cm     TR Peak grad:   17.0 mmHg MV Decel Time: 359 msec     TR Vmax:        206.00 cm/s MR Peak grad: 22.5 mmHg MR Vmax:      237.00 cm/s   SHUNTS MV E velocity: 80.10 cm/s   Systemic VTI:  0.42 m MV A velocity: 103.00 cm/s  Systemic Diam: 1.90 cm MV E/A ratio:  0.78 Dalton McleanMD Electronically signed by Franki Monte Signature Date/Time: 02/18/2022/3:00:19 PM    Final    VAS US CAROTID  Result Date: 02/18/2022 Carotid Arterial Duplex Study Patient Name:  KAREEN HITSMAN  Date of Exam:   02/18/2022 Medical Rec #: [id number]            Accession #:    [id number] Date of Birth: 03/29/1941           Patient Gender: M Patient Age:   30 years Exam Location:  [facility] Procedure:      VAS US CAROTID Referring Phys: DAVID ORTIZ --------------------------------------------------------------------------------  Indications:       Stenosis. Risk Factors:      Hypertension, hyperlipidemia. Comparison Study:  No prior studies. Performing Technologist: Oliver Hum RVT  Examination Guidelines: A complete evaluation includes B-mode imaging, spectral Doppler, color Doppler, and power Doppler as needed of all accessible portions of each vessel. Bilateral testing is considered an integral part of a complete examination. Limited examinations for reoccurring indications may be performed as noted.  Right Carotid Findings: +----------+--------+--------+--------+-----------------------+--------+           PSV cm/sEDV cm/sStenosisPlaque Description     Comments +----------+--------+--------+--------+-----------------------+--------+ CCA Prox  82  15              smooth and heterogenoustortuous  +----------+--------+--------+--------+-----------------------+--------+ CCA Distal63      15              smooth and heterogenous         +----------+--------+--------+--------+-----------------------+--------+ ICA Prox  54      12              smooth and heterogenous         +----------+--------+--------+--------+-----------------------+--------+ ICA Distal57      19                                     tortuous +----------+--------+--------+--------+-----------------------+--------+ ECA       76      8                                               +----------+--------+--------+--------+-----------------------+--------+ +----------+--------+-------+--------+-------------------+           PSV cm/sEDV cmsDescribeArm Pressure (mmHG) +----------+--------+-------+--------+-------------------+ [id number]                                         +----------+--------+-------+--------+-------------------+ +---------+--------+--+--------+-+---------+ VertebralPSV cm/s26EDV cm/s9Antegrade +---------+--------+--+--------+-+---------+  Left Carotid Findings: +----------+--------+--------+--------+-----------------------+--------+           PSV cm/sEDV cm/sStenosisPlaque Description     Comments +----------+--------+--------+--------+-----------------------+--------+ CCA Prox  77      13              smooth and heterogenous         +----------+--------+--------+--------+-----------------------+--------+ CCA Distal86      15              smooth and heterogenous         +----------+--------+--------+--------+-----------------------+--------+ ICA Prox  51      15              smooth and heterogenous         +----------+--------+--------+--------+-----------------------+--------+ ICA Distal50      16                                     tortuous +----------+--------+--------+--------+-----------------------+--------+ ECA       53      9                                                +----------+--------+--------+--------+-----------------------+--------+ +----------+--------+--------+--------+-------------------+           PSV cm/sEDV cm/sDescribeArm Pressure (mmHG) +----------+--------+--------+--------+-------------------+ [id number]                                         +----------+--------+--------+--------+-------------------+ +---------+--------+--+--------+-+---------+ VertebralPSV cm/s32EDV cm/s7Antegrade +---------+--------+--+--------+-+---------+   Summary: Right Carotid: Velocities in the right ICA are consistent with a 1-39% stenosis. Left Carotid: Velocities in the left ICA are consistent with a 1-39% stenosis. Vertebrals: Bilateral vertebral arteries demonstrate antegrade flow. *See table(s) above for measurements and  observations.  Electronically signed by Monica Martinez MD on 02/18/2022 at 12:09:12 PM.    Final      LOS: 1 day   Antonieta Pert, MD [facility]  02/20/2022, 11:45 AM

## 2022-02-21 DIAGNOSIS — R531 Weakness: Secondary | ICD-10-CM | POA: Diagnosis not present

## 2022-02-21 NOTE — Progress Notes (Signed)
   02/21/22 1900  Clinical Encounter Type  Visited With Family  Visit Type Initial;Spiritual support  Referral From Family  Consult/Referral To Chaplain  Spiritual Encounters  Spiritual Needs Sacred text;Prayer;Emotional  Stress Factors  Patient Stress Factors None identified  Family Stress Factors Family relationships;Loss of control;Major life changes   Chaplain met with family in lobby and asisted them to patients room while in elevator.  Son and Daughtr in Sports coach and two grandchildren were visiting [name] Good, who is a retired Theme park manager.  Son asked for prayer.  Prayed with family in hallway and provided emotional support.

## 2022-02-21 NOTE — Progress Notes (Signed)
PROGRESS NOTE ALIAS VILLAGRAN  EXB:[id number] DOB: [birth year] DOA: 02/17/2022 PCP: [name], MD   Brief Narrative/Hospital Course: 81 year old male with history of hyperlipidemia hypertension osteoarthritis lacunar stroke hyperglycemia polymyalgia rheumatica, GERD and depression who lives at home alone admitted with working diagnosis of generalized weakness  He presented to the ED with complaint of generalized weakness past several weeks, also felt lightheaded and fell hitting his head 10/31 while trying to get up without loss of consciousness. In the ED afebrile, bradycardia 49 BUN normal negative for COVID CBC CMP with mild anemia thrombocytopenia CXR-no active cardiopulmonary disease. CT head 2/O-no acute intracranial abnormality.chronic small vessel ischemic disease and multiple scattered remote lacunar infarcts CT cervical spine with osteopenia and degenerative changes.  Retained secretions in the trachea versus aspirate.  Carotic arthrosclerosis.  There was a 2 cm right thyroid nodule with nonemergent follow-up ultrasound recommended.  There was carotic atherosclerosis bilaterally.  Patient was admitted for further work-up Work-up has been unremarkable likely multifactorial etiology with poor oral intake generalized weakness deconditioning grieving his wife's loss.PT OT has worked and advised skilled nursing facility upon discharge   Subjective: Seen and examined, alert awake no new complaints Overnight no fever   Assessment and Plan: Principal Problem:   Generalized weakness Active Problems:   Hyperlipidemia   Essential hypertension   Low vitamin D level   Depression   GERD (gastroesophageal reflux disease)   Grade I diastolic dysfunction   Normocytic anemia   Thyroid nodule   Carotid atherosclerosis   Protein-calorie malnutrition, severe   Generalized weakness Fall at home In Grief: Suspect multifactorial, deconditioning and from passing of his wife and patient  is still grieving so far work-up unremarkable, no focal weakness CT head no acute finding echocardiogram carotid duplex unremarkable.  PT OT eval appreciated and recommending skilled nursing facility. B12 high, normalnh3, TSH and vitamin D.   History of lacunar stroke: Has global weakness and is nonfocal G1 DD history-stable BNP/euvolemic. Carotid atherosclerosis per imaging cardiac duplex unremarkable Hyperlipidemia not on statin-advised up with PCP to resume but not to start given patient is allergic- Hypertension on lisinopril 20 mg-currently controlled.  Continue same.   Vitamin D insufficiency-started on supplement Depression on Celexa, will add low-dose Remeron at bedtime to estimate appetite and help with depression and also grief from his loss. GERD on PPI Thyroid nodule will need follow-up with ultrasound-  Severe malnutrition augment diet as below  Nutrition Problem: Severe Malnutrition Etiology: chronic illness Signs/Symptoms: severe fat depletion, severe muscle depletion, percent weight loss Interventions: Liberalize Diet, MVI, Boost Plus  DVT prophylaxis: Place and maintain sequential compression device Start: 02/19/22 1640 SCDs Start: 02/18/22 0857 Code Status:   Code Status: Full Code Family Communication: plan of care discussed with patient at bedside.  Patient status is: Remains observation status  Level of care: Telemetry  Dispo: The patient is from: home lives alone reports his sister is the POA-was updated previously.            Anticipated disposition:SNF once available  Mobility Assessment (last 72 hours)     Mobility Assessment     Row Name 02/20/22 2300 02/20/22 1136 02/20/22 1100 02/19/22 2100 02/19/22 0001   Does patient have an order for bedrest or is patient medically unstable No - Continue assessment -- -- No - Continue assessment No - Continue assessment   What is the highest level of mobility based on the progressive mobility assessment? Level 3  (Stands with assist) - Balance while standing  and cannot march in place Level 3 (Stands with assist) - Balance while standing  and cannot march in place Level 3 (Stands with assist) - Balance while standing  and cannot march in place Level 1 (Bedfast) - Unable to balance while sitting on edge of bed Level 1 (Bedfast) - Unable to balance while sitting on edge of bed   Is the above level different from baseline mobility prior to current illness? Yes - Recommend PT order -- -- Yes - Recommend PT order Yes - Recommend PT order    [place] Name 02/18/22 2000 02/18/22 1841         Does patient have an order for bedrest or is patient medically unstable No - Continue assessment No - Continue assessment      What is the highest level of mobility based on the progressive mobility assessment? Level 1 (Bedfast) - Unable to balance while sitting on edge of bed Level 1 (Bedfast) - Unable to balance while sitting on edge of bed      Is the above level different from baseline mobility prior to current illness? Yes - Recommend PT order Yes - Recommend PT order              Objective: Vitals last 24 hrs: Vitals:   02/20/22 1337 02/20/22 2024 02/20/22 2137 02/21/22 0435  BP: 107/71 (!) 150/84 (!) 151/94 123/68  Pulse: 65 68 82 67  Resp: '20 17  15  '$ Temp: (!) 97.3 F (36.3 C) 97.9 F (36.6 C)  98.1 F (36.7 C)  TempSrc: Oral Oral  Oral  SpO2: 100% 100% 98% 100%  Weight:      Height:       Weight change:   Physical Examination: General exam: AA oriented appears to be comfortable, weak,older appearing HEENT:Oral mucosa moist, Ear/Nose WNL grossly, dentition normal. Respiratory system: bilaterally clear BS, no use of accessory muscle Cardiovascular system: S1 & S2 +, regular rate. Gastrointestinal system: Abdomen soft, NT,ND,BS+ Nervous System:Alert, awake, moving extremities and grossly nonfocal Extremities: LE ankle edema neg, lower extremities warm Skin: No rashes,no icterus. MSK: Normal muscle  bulk,tone, power   Medications reviewed:  Scheduled Meds:  cholecalciferol  5,000 Units Oral Daily   citalopram  10 mg Oral Daily   dorzolamide-timolol  1 drop Both Eyes BID   fesoterodine  4 mg Oral Daily   lactose free nutrition  237 mL Oral TID WC   lisinopril  20 mg Oral Daily   mirtazapine  7.5 mg Oral QHS   multivitamin with minerals  1 tablet Oral Daily   pantoprazole  40 mg Oral Daily   Continuous Infusions:    Diet Order             Diet regular Room service appropriate? No; Fluid consistency: Thin  Diet effective now                   Intake/Output Summary (Last 24 hours) at 02/21/2022 0748 Last data filed at 02/21/2022 0600 Gross per 24 hour  Intake 840 ml  Output 1050 ml  Net -210 ml    Net IO Since Admission: -2,520 mL [02/21/22 0748]  Wt Readings from Last 3 Encounters:  02/18/22 57.4 kg  11/17/21 63.2 kg  09/01/21 64.4 kg     Unresulted Labs (From admission, onward)    None     Data Reviewed: I have personally reviewed following labs and imaging studies CBC: Recent Labs  Lab 02/17/22 2333 02/19/22 0505  WBC 3.7*  3.4*  NEUTROABS 1.3*  --   HGB 10.9* 12.7*  HCT 33.6* 38.4*  MCV 91.1 88.3  PLT 165 561    Basic Metabolic Panel: Recent Labs  Lab 02/17/22 2333 02/19/22 0505  NA 138 137  K 3.9 3.5  CL 104 101  CO2 28 27  GLUCOSE 97 98  BUN 23 15  CREATININE 1.03 0.85  CALCIUM 8.6* 9.3    GFR: Estimated Creatinine Clearance: 55.3 mL/min (by C-G formula based on SCr of 0.85 mg/dL). Liver Function Tests: Recent Labs  Lab 02/18/22 0215 02/19/22 0505  AST 17 20  ALT 14 14  ALKPHOS 56 55  BILITOT 0.6 1.0  PROT 6.1* 6.6  ALBUMIN 3.7 4.0    Recent Labs    02/19/22 1357  TSH 1.018   Culture/Microbiology No results found for: "SDES", "SPECREQUEST", "CULT", "REPTSTATUS"  Other culture-see note  Radiology Studies: No results found.   LOS: 1 day   Antonieta Pert, MD [facility]  02/21/2022, 7:48 AM

## 2022-02-22 DIAGNOSIS — R531 Weakness: Secondary | ICD-10-CM | POA: Diagnosis not present

## 2022-02-22 NOTE — TOC Progression Note (Signed)
Transition of Care [facility]) - Progression Note    Patient Details  Name: [name] Good MRN: [id number] Date of Birth: 04-Jun-1940  Transition of Care [facility]) CM/SW Contact  Zein Helbing, Juliann Pulse, RN Phone Number: 02/22/2022, 3:26 PM  Clinical Narrative: Patient agreed to ST SNF;also spoke to [name](sister) aware of bed offers. Await choice, prior auth.    1. 1.5 mi [facility] [place], [place] [address] [phone] Overall rating Much below average 2. 1.9 mi [facility] [place] [address] [place], [place] [address] [phone] Overall rating Average 3. 2 mi [facility] [address] [place], [place] [address] [phone] Overall rating Much below average 4. 2.4 mi [facility] at the [place], [place] [address] [phone] Overall rating Above average 5. 2.6 mi [facility] [place], [place] [address] [phone] Overall rating Much below average 6. 3.4 mi [employer] [place], [place] [address] [phone] Overall rating Much below average 7. 3.7 mi [facility] [place], [place] [address] [phone] Overall rating Much above average 8. 3.9 mi [facility] [place], [place] [address] [phone] Overall rating Above average 9. [place], [place] [address] [phone] Overall rating Below average 10. 4.2 mi [place] [address] [place], [place] [address] [phone] Overall rating Below average 11. 4.6 mi [facility] [address] [place], [place] [address] [phone] Overall  rating Much below average 12. 6.3 mi [facility] [address] [place], [place] [address] [phone] Overall rating Above average 13. 9.2 mi [place], [place] [address] [phone] Overall rating Below average 14. 9.6 [place], [place] [address] [phone] Overall rating Much above average 15. 9.8 mi [facility] [address] [place], [place] [address] [phone] Overall rating Above average 16. 9.9 mi [facility] [address] [place], [place] [address] [phone] Overall rating Much above average 17. 10.7 mi [place] [facility] [address] [place], [place] [address] [phone] Overall rating Much above average 18. 13.3 [facility] [address] [place], [place] [address] [phone] Overall rating Much below average 19. 13.6 mi [facility] [address] [place], [place] [address] [phone] Overall rating Much below average 20. 14.2 mi [facility] [place], [place] [address] [phone] Overall rating Much above average 21. 14.4 [place] [address] [place], [place] [address] [phone] Overall rating Much below average 22. 15 mi [facility] [place], [place] [address] [phone] Overall rating Above average 23. 15.2 mi [facility] [address] [place], [place] [address] [phone] Overall rating Average 24. 15.3 mi [facility] [address] [place],  [address] [phone] Overall rating Average 25. 16.9 mi [facility] [place],  [address] [phone] Overall rating Above  average 26. 18.1 mi [place]  [place], [place] [address] [phone] Overall rating Average 27. 18.9 mi [facility] [address] [place], [place] [address] [phone] Overall rating Much below average 28. 19.7 mi [facility] [place], [place] [address] [phone] Overall rating Below average 29. 20.2 mi [address] at [facility] at [place], [place] [address] [phone] Overall rating Much above average 30. 20.4 mi [facility] and [facility] [address] [place], [place] [address] [phone] Overall rating Much below average 31. 20.4 mi [facility] [address] [place], [place] [address] [phone] Overall rating Much below average 32. 20.6 [place], [place] [address] [phone] Overall rating Much above average 33. 21.2 [address] [place], [place] [address] [phone] Overall rating Average 34. 22.7 mi [facility] [address] [place], [place] [address] [phone] Overall rating Below average 35. 22.8 mi [employer] [address] [place], [place] [address] [phone] Overall rating Much above average 36. 23.1 mi [place] [address] [place], [place] [address] [phone] Overall rating Average 37. 23.5 mi [facility] [address] [place], [place] [address] [phone] Overall rating Above average 38. 23.7 [place], [place] [address] [phone] Overall rating Not available18 39. [place], [place] [address] [phone] Overall rating Much  below average 40. 24.8 mi [place] [address] [place], [place] [address] [phone] Overall rating Above average To explore an  Expected Discharge Plan: [place] Barriers to Discharge: Continued Medical Work up  Expected Discharge Plan and Services Expected Discharge Plan: [place]   Discharge Planning Services: CM Consult Post Acute Care Choice: [employer] arrangements for the past 2 months: Single Family Home Expected Discharge Date: 02/20/22                                     Social Determinants of Health (SDOH) Interventions    Readmission Risk Interventions     No data to display

## 2022-02-22 NOTE — Progress Notes (Signed)
PROGRESS NOTE [name] Good  EXB:[id number] DOB: 1941-04-11 DOA: 02/17/2022 PCP: [name], MD   Brief Narrative/Hospital Course: 81 year old male with history of hyperlipidemia hypertension osteoarthritis lacunar stroke hyperglycemia polymyalgia rheumatica, GERD and depression who lives at home alone admitted with working diagnosis of generalized weakness  He presented to the ED with complaint of generalized weakness past several weeks, also felt lightheaded and fell hitting his head 10/31 while trying to get up without loss of consciousness. In the ED afebrile, bradycardia 49 BUN normal negative for COVID CBC CMP with mild anemia thrombocytopenia CXR-no active cardiopulmonary disease. CT head 2/O-no acute intracranial abnormality.chronic small vessel ischemic disease and multiple scattered remote lacunar infarcts CT cervical spine with osteopenia and degenerative changes.  Retained secretions in the trachea versus aspirate.  Carotic arthrosclerosis.  There was a 2 cm right thyroid nodule with nonemergent follow-up ultrasound recommended.  There was carotic atherosclerosis bilaterally.  Patient was admitted for further work-up Work-up has been unremarkable likely multifactorial etiology with poor oral intake generalized weakness deconditioning grieving his wife's loss.PT OT has worked and advised skilled nursing facility upon discharge   Subjective: Seen and examined this morning daughter at the bedside.  Patient is alert awake no complaints.  Appears weak and deconditioned.     Assessment and Plan: Principal Problem:   Generalized weakness Active Problems:   Hyperlipidemia   Essential hypertension   Low vitamin D level   Depression   GERD (gastroesophageal reflux disease)   Grade I diastolic dysfunction   Normocytic anemia   Thyroid nodule   Carotid atherosclerosis   Protein-calorie malnutrition, severe   Generalized weakness Fall at home In Grief-his wife passed away  03/03/22: Suspect multifactorial, deconditioning and from passing of his wife and patient is still grieving so far work-up unremarkable, no focal weakness CT head no acute finding echocardiogram carotid duplex unremarkable.  PT OT eval appreciated and recommending skilled nursing facility. B12 high, normalnh3, TSH and vitamin D.  Orthostatic vitals -11/4 although could not stand too long  History of lacunar stroke: Has global weakness and is nonfocal Chronic grade 1 diastolic dysfunction,G1 DD history-stable BNP/euvolemic. Carotid atherosclerosis per imaging cardiac duplex unremarkable Hyperlipidemia not on statin-advised up with PCP f/u- not started given patient's weakness Hypertension on lisinopril 20 mg-currently controlled.  Continue same.   Vitamin D insufficiency-started on supplement Depression on Celexa> continue the same. Dc Remeron-making somewhat weak. He is grieving his wife's loss recently GERD on PPI Thyroid nodule will need follow-up with ultrasound as OP Severe malnutrition augment diet as below  Nutrition Problem: Severe Malnutrition Etiology: chronic illness Signs/Symptoms: severe fat depletion, severe muscle depletion, percent weight loss Interventions: Liberalize Diet, MVI, Boost Plus  DVT prophylaxis: Place and maintain sequential compression device Start: 02/19/22 1640 SCDs Start: 02/18/22 0857 Code Status:   Code Status: Full Code Family Communication: plan of care discussed with patient /his daughter at bedside.  Patient status is: Remains observation status  Level of care: Telemetry  Dispo: The patient is from: home lives alone reports his sister is the POA-was updated previously.            Anticipated disposition:SNF once available  Mobility Assessment (last 72 hours)     Mobility Assessment     Row Name 02/21/22 2156 02/20/22 2300 02/20/22 1136 02/20/22 1100 02/19/22 2100   Does patient have an order for bedrest or is patient medically unstable No -  Continue assessment No - Continue assessment -- -- No - Continue assessment  What is the highest level of mobility based on the progressive mobility assessment? Level 3 (Stands with assist) - Balance while standing  and cannot march in place Level 3 (Stands with assist) - Balance while standing  and cannot march in place Level 3 (Stands with assist) - Balance while standing  and cannot march in place Level 3 (Stands with assist) - Balance while standing  and cannot march in place Level 1 (Bedfast) - Unable to balance while sitting on edge of bed   Is the above level different from baseline mobility prior to current illness? Yes - Recommend PT order Yes - Recommend PT order -- -- Yes - Recommend PT order           Objective: Vitals last 24 hrs: Vitals:   02/21/22 1442 02/21/22 1447 02/21/22 1451 02/21/22 2131  BP: 100/62 126/80 126/70 105/61  Pulse: 60 68 68 65  Resp: 14   18  Temp: 98.8 F (37.1 C)   97.7 F (36.5 C)  TempSrc: Oral   Oral  SpO2: 100% 100%  100%  Weight:      Height:       Weight change:   Physical Examination: General exam: AA oriented ill looking frail, weak,older appearing HEENT:Oral mucosa moist, Ear/Nose WNL grossly, dentition normal. Respiratory system: bilaterally clear BS, no use of accessory muscle Cardiovascular system: S1 & S2 +, regular rate. Gastrointestinal system: Abdomen soft,NT,ND,BS+ Nervous System:Alert, awake, moving extremities and grossly nonfocal Extremities: LE ankle edema NEG, lower extremities warm Skin: No rashes,no icterus. MSK: Normal muscle bulk,tone, power   Medications reviewed:  Scheduled Meds:  cholecalciferol  5,000 Units Oral Daily   citalopram  10 mg Oral Daily   dorzolamide-timolol  1 drop Both Eyes BID   fesoterodine  4 mg Oral Daily   lactose free nutrition  237 mL Oral TID WC   lisinopril  20 mg Oral Daily   mirtazapine  7.5 mg Oral QHS   multivitamin with minerals  1 tablet Oral Daily   pantoprazole  40 mg Oral  Daily   Continuous Infusions:    Diet Order             Diet regular Room service appropriate? No; Fluid consistency: Thin  Diet effective now                  Intake/Output Summary (Last 24 hours) at 02/22/2022 1058 Last data filed at 02/22/2022 0803 Gross per 24 hour  Intake 530 ml  Output 500 ml  Net 30 ml   Net IO Since Admission: -2,790 mL [02/22/22 1058]  Wt Readings from Last 3 Encounters:  02/18/22 57.4 kg  11/17/21 63.2 kg  09/01/21 64.4 kg    Unresulted Labs (From admission, onward)    None     Data Reviewed: I have personally reviewed following labs and imaging studies CBC: Recent Labs  Lab 02/17/22 2333 02/19/22 0505  WBC 3.7* 3.4*  NEUTROABS 1.3*  --   HGB 10.9* 12.7*  HCT 33.6* 38.4*  MCV 91.1 88.3  PLT 165 073    Basic Metabolic Panel: Recent Labs  Lab 02/17/22 2333 02/19/22 0505  NA 138 137  K 3.9 3.5  CL 104 101  CO2 28 27  GLUCOSE 97 98  BUN 23 15  CREATININE 1.03 0.85  CALCIUM 8.6* 9.3   GFR: Estimated Creatinine Clearance: 55.3 mL/min (by C-G formula based on SCr of 0.85 mg/dL). Liver Function Tests: Recent Labs  Lab 02/18/22 0215 02/19/22 0505  AST 17 20  ALT 14 14  ALKPHOS 56 55  BILITOT 0.6 1.0  PROT 6.1* 6.6  ALBUMIN 3.7 4.0    Recent Labs    02/19/22 1357  TSH 1.018   Culture/Microbiology No results found for: "SDES", "SPECREQUEST", "CULT", "REPTSTATUS"  Other culture-see note  Radiology Studies: No results found.   LOS: 1 day   Antonieta Pert, MD [facility]  02/22/2022, 10:58 AM

## 2022-02-22 NOTE — Progress Notes (Signed)
List of SNF offers given to Vickii Chafe (sister).

## 2022-02-23 ENCOUNTER — Observation Stay (HOSPITAL_COMMUNITY): Payer: Medicare HMO

## 2022-02-23 DIAGNOSIS — R531 Weakness: Secondary | ICD-10-CM | POA: Diagnosis not present

## 2022-02-23 DIAGNOSIS — R509 Fever, unspecified: Secondary | ICD-10-CM | POA: Diagnosis not present

## 2022-02-23 LAB — CBC
HCT: 31.4 % — ABNORMAL LOW (ref 39.0–52.0)
Hemoglobin: 10.2 g/dL — ABNORMAL LOW (ref 13.0–17.0)
MCH: 29.7 pg (ref 26.0–34.0)
MCHC: 32.5 g/dL (ref 30.0–36.0)
MCV: 91.3 fL (ref 80.0–100.0)
Platelets: 150 10*3/uL (ref 150–400)
RBC: 3.44 MIL/uL — ABNORMAL LOW (ref 4.22–5.81)
RDW: 12.4 % (ref 11.5–15.5)
WBC: 4.3 10*3/uL (ref 4.0–10.5)
nRBC: 0 % (ref 0.0–0.2)

## 2022-02-23 LAB — URINALYSIS, ROUTINE W REFLEX MICROSCOPIC
Bilirubin Urine: NEGATIVE
Glucose, UA: NEGATIVE mg/dL
Hgb urine dipstick: NEGATIVE
Ketones, ur: NEGATIVE mg/dL
Leukocytes,Ua: NEGATIVE
Nitrite: NEGATIVE
Protein, ur: NEGATIVE mg/dL
Specific Gravity, Urine: 1.009 (ref 1.005–1.030)
pH: 6 (ref 5.0–8.0)

## 2022-02-23 LAB — COMPREHENSIVE METABOLIC PANEL
ALT: 15 U/L (ref 0–44)
AST: 16 U/L (ref 15–41)
Albumin: 3.3 g/dL — ABNORMAL LOW (ref 3.5–5.0)
Alkaline Phosphatase: 52 U/L (ref 38–126)
Anion gap: 3 — ABNORMAL LOW (ref 5–15)
BUN: 24 mg/dL — ABNORMAL HIGH (ref 8–23)
CO2: 28 mmol/L (ref 22–32)
Calcium: 8.1 mg/dL — ABNORMAL LOW (ref 8.9–10.3)
Chloride: 103 mmol/L (ref 98–111)
Creatinine, Ser: 1.12 mg/dL (ref 0.61–1.24)
GFR, Estimated: >60 mL/min (ref >60)
Glucose, Bld: 94 mg/dL (ref 70–99)
Potassium: 3.9 mmol/L (ref 3.5–5.1)
Sodium: 134 mmol/L — ABNORMAL LOW (ref 135–145)
Total Bilirubin: 0.4 mg/dL (ref 0.3–1.2)
Total Protein: 5.7 g/dL — ABNORMAL LOW (ref 6.5–8.1)

## 2022-02-23 MED ORDER — SODIUM CHLORIDE 0.9 % IV BOLUS
1000.0000 mL | Freq: Once | INTRAVENOUS | Status: AC
  Administered 2022-02-23: 1000 mL via INTRAVENOUS

## 2022-02-23 MED ORDER — LACTATED RINGERS IV SOLN: INTRAVENOUS | Status: DC

## 2022-02-23 NOTE — Telephone Encounter (Signed)
Called number provided in regards Dr. Jenny Reichmann advice, left voice mail for number provided to call back

## 2022-02-23 NOTE — Progress Notes (Signed)
PROGRESS NOTE [name] Good  TKZ:[id number] DOB: 05-03-1940 DOA: 02/17/2022 PCP: [name], MD   Brief Narrative/Hospital Course: 81 year old male with history of hyperlipidemia hypertension osteoarthritis lacunar stroke hyperglycemia polymyalgia rheumatica, GERD and depression who lives at home alone admitted with working diagnosis of generalized weakness  He presented to the ED with complaint of generalized weakness past several weeks, also felt lightheaded and fell hitting his head 10/31 while trying to get up without loss of consciousness. In the ED afebrile, bradycardia 49 BUN normal negative for COVID CBC CMP with mild anemia thrombocytopenia CXR-no active cardiopulmonary disease. CT head 2/O-no acute intracranial abnormality.chronic small vessel ischemic disease and multiple scattered remote lacunar infarcts CT cervical spine with osteopenia and degenerative changes.  Retained secretions in the trachea versus aspirate.  Carotic arthrosclerosis.  There was a 2 cm right thyroid nodule with nonemergent follow-up ultrasound recommended.  There was carotic atherosclerosis bilaterally.  Patient was admitted for further work-up Work-up has been unremarkable likely multifactorial etiology with poor oral intake generalized weakness deconditioning grieving his wife's loss.PT OT has worked and advised skilled nursing facility upon discharge   Subjective: Seen and examined this am Noted low grade temp yesetrday, no recurrence checked urine and chest x-ray this morning, patient has no specific complaints appears weak deconditioned He was seen again due to hypotension- daughter at bedside He denied any complaint He was on the bedside recliner  Assessment and Plan: Principal Problem:   Generalized weakness Active Problems:   Hyperlipidemia   Essential hypertension   Low vitamin D level   Depression   GERD (gastroesophageal reflux disease)   Grade I diastolic dysfunction    Normocytic anemia   Thyroid nodule   Carotid atherosclerosis   Protein-calorie malnutrition, severe   Generalized weakness Fall at home In Grief-his wife passed away 02/07/2022: Suspect multifactorial, deconditioning with recent passing of his wife> lost significant weight work-up unremarkable, no focal weakness CT head no acute finding echocardiogram carotid duplex unremarkable.  PT OT working with him recommending skilled nursing facility.  B12 high, normal nh3, TSH and vitamin D.  Orthostatic vitals -11/4 negative, although could not stand too long  Hypotension this afternoon in 80s:No complaint.  We will give 1 L bolus> followed up blood pressure improved to 96/59, getting routine labs and continue IV fluid hydration monitor blood pressure  Low-grade fever 11/5: No recurrence, chest x-ray UA done 11/6-unremarkable.  Encourage incentive spirometry-to minimize atelectasis that can cause fever  History of lacunar stroke: Has global weakness and is nonfocal Chronic grade 1 diastolic dysfunction,G1 DD history-stable BNP/euvolemic. Carotid atherosclerosis per imaging cardiac duplex unremarkable Hyperlipidemia not on statin-advised up with PCP f/u- not started given patient's weakness Hypertension on lisinopril 20 mg: We will hold if blood pressure continues to be low  Vitamin D insufficiency-started on supplement Depression on Celexa continue the same.He is grieving his wife's loss recently GERD continue PPI Thyroid nodule will need follow-up with ultrasound as OP Severe malnutrition augment diet as below  Nutrition Problem: Severe Malnutrition Etiology: chronic illness Signs/Symptoms: severe fat depletion, severe muscle depletion, percent weight loss Interventions: Liberalize Diet, MVI, Boost Plus  DVT prophylaxis: Place and maintain sequential compression device Start: 02/19/22 1640 SCDs Start: 02/18/22 0857 Code Status:   Code Status: Full Code Family Communication: plan of care  discussed with patient /his daughter at bedside.  Patient status is: Remains observation status  Level of care: Telemetry  Dispo: The patient is from: home lives alone reports his sister is the 84  updated previously.  Daughter was updated previously, sister looking into placement. Again daughter updated at the bed side            Anticipated disposition: awaiting SNF  Mobility Assessment (last 72 hours)     Mobility Assessment     Row Name 02/23/22 1223 02/22/22 2255 02/22/22 2254 02/21/22 2156 02/20/22 2300   Does patient have an order for bedrest or is patient medically unstable -- No - Continue assessment No - Continue assessment No - Continue assessment No - Continue assessment   What is the highest level of mobility based on the progressive mobility assessment? Level 3 (Stands with assist) - Balance while standing  and cannot march in place Level 3 (Stands with assist) - Balance while standing  and cannot march in place Level 3 (Stands with assist) - Balance while standing  and cannot march in place Level 3 (Stands with assist) - Balance while standing  and cannot march in place Level 3 (Stands with assist) - Balance while standing  and cannot march in place   Is the above level different from baseline mobility prior to current illness? -- Yes - Recommend PT order Yes - Recommend PT order Yes - Recommend PT order Yes - Recommend PT order           Objective: Vitals last 24 hrs: Vitals:   02/22/22 1306 02/22/22 2036 02/23/22 0427 02/23/22 1411  BP: 110/74 (!) 154/95 109/76 (!) 81/49  Pulse: 72 79 (!) 58 71  Resp: 14 17 (!) 21 20  Temp: (!) 100.7 F (38.2 C) 97.8 F (36.6 C) 98 F (36.7 C) 97.8 F (36.6 C)  TempSrc: Oral Oral Oral Oral  SpO2: 99% 100% 97% 100%  Weight:      Height:       Weight change:   Physical Examination: General exam: Alert awake not in distress, weak frail  HEENT:Oral mucosa moist, Ear/Nose WNL grossly, dentition normal. Respiratory system:  bilaterally clear,no use of accessory muscle Cardiovascular system: S1 & S2 +, regular rate. Gastrointestinal system: Abdomen soft, NT,ND,BS+ Nervous System:Alert, awake, moving extremities and grossly nonfocal Extremities: LE ankle edema , lower extremities warm Skin:No rashes,no icterus. RXV:QMGQQP muscle bulk,tone, power   Medications reviewed:  Scheduled Meds:  cholecalciferol  5,000 Units Oral Daily   citalopram  10 mg Oral Daily   dorzolamide-timolol  1 drop Both Eyes BID   fesoterodine  4 mg Oral Daily   lactose free nutrition  237 mL Oral TID WC   lisinopril  20 mg Oral Daily   multivitamin with minerals  1 tablet Oral Daily   pantoprazole  40 mg Oral Daily  Continuous Infusions:  sodium chloride      Diet Order             Diet regular Room service appropriate? No; Fluid consistency: Thin  Diet effective now                  Intake/Output Summary (Last 24 hours) at 02/23/2022 1418 Last data filed at 02/23/2022 1000 Gross per 24 hour  Intake 360 ml  Output 2150 ml  Net -1790 ml   Net IO Since Admission: -4,580 mL [02/23/22 1418]  Wt Readings from Last 3 Encounters:  02/18/22 57.4 kg  11/17/21 63.2 kg  09/01/21 64.4 kg    Unresulted Labs (From admission, onward)     Start     Ordered   02/23/22 1417  CBC  ONCE - STAT,   STAT  Question:  Specimen collection method  Answer:  Lab=Lab collect   02/23/22 1417   02/23/22 1417  Comprehensive metabolic panel  ONCE - STAT,   STAT       Question:  Specimen collection method  Answer:  Lab=Lab collect   02/23/22 1417          Data Reviewed: I have personally reviewed following labs and imaging studies CBC: Recent Labs  Lab 02/17/22 2333 02/19/22 0505  WBC 3.7* 3.4*  NEUTROABS 1.3*  --   HGB 10.9* 12.7*  HCT 33.6* 38.4*  MCV 91.1 88.3  PLT 165 829    Basic Metabolic Panel: Recent Labs  Lab 02/17/22 2333 02/19/22 0505  NA 138 137  K 3.9 3.5  CL 104 101  CO2 28 27  GLUCOSE 97 98  BUN 23 15   CREATININE 1.03 0.85  CALCIUM 8.6* 9.3   GFR: Estimated Creatinine Clearance: 55.3 mL/min (by C-G formula based on SCr of 0.85 mg/dL). Liver Function Tests: Recent Labs  Lab 02/18/22 0215 02/19/22 0505  AST 17 20  ALT 14 14  ALKPHOS 56 55  BILITOT 0.6 1.0  PROT 6.1* 6.6  ALBUMIN 3.7 4.0    No results for input(s): "TSH", "T4TOTAL", "FREET4", "T3FREE", "THYROIDAB" in the last 72 hours. Culture/Microbiology No results found for: "SDES", "[place]", "CULT", "REPTSTATUS"  Other culture-see note  Radiology Studies: DG Chest Port 1 View  Result Date: 02/23/2022 CLINICAL DATA:  Fever. EXAM: PORTABLE CHEST 1 VIEW COMPARISON:  Chest radiographs 02/18/2022 FINDINGS: The cardiomediastinal silhouette is unchanged with normal heart size. Minimal scarring or atelectasis is noted in the left lung base. No segmental airspace consolidation, edema, sizable pleural effusion, or pneumothorax is identified. No acute osseous abnormality is seen. IMPRESSION: No active disease. Electronically Signed   By: Logan Bores M.D.   On: 02/23/2022 08:36     LOS: 0 days   Antonieta Pert, MD [facility]  02/23/2022, 2:18 PM

## 2022-02-23 NOTE — TOC Progression Note (Addendum)
Transition of Care [facility]) - Progression Note    Patient Details  Name: [name] Good MRN: [id number] Date of Birth: 09/01/1940  Transition of Care [facility]) CM/SW Contact  [name] Good, [name] Pulse, RN Phone Number: 02/23/2022, 3:47 PM  Clinical Narrative:[facility] chosen rep [name] Good aware. [name] Good #[id number] auth.      Expected Discharge Plan: Skilled Nursing Facility Barriers to Discharge: Insurance Authorization  Expected Discharge Plan and Services Expected Discharge Plan: [place]   Discharge Planning Services: CM Consult Post Acute Care Choice: [place] arrangements for the past 2 months: Single Family Home Expected Discharge Date: 02/20/22                                     Social Determinants of Health (SDOH) Interventions    Readmission Risk Interventions     No data to display

## 2022-02-23 NOTE — Progress Notes (Signed)
Physical Therapy Treatment Patient Details Name: [name] Good MRN: [id number] DOB: [birth year] Today's Date: 02/23/2022   History of Present Illness 81 year old male with history of hyperlipidemia hypertension osteoarthritis lacunar stroke hyperglycemia polymyalgia rheumatica, GERD and depression who lives at home alone admitted with working diagnosis of generalized weakness and a fall    PT Comments    Pt agreeable to working with therapy. He continues to require Mod A for bed mobility/transfers and +2 assist for ambulation safety with RW. He tolerated session well. He remains at high risk for falls. Continue to recommend SNF for rehab.     Recommendations for follow up therapy are one component of a multi-disciplinary discharge planning process, led by the attending physician.  Recommendations may be updated based on patient status, additional functional criteria and insurance authorization.  Follow Up Recommendations  Skilled nursing-short term rehab (<3 hours/day) Can patient physically be transported by private vehicle: No   Assistance Recommended at Discharge Frequent or constant Supervision/Assistance  Patient can return home with the following A lot of help with walking and/or transfers;A lot of help with bathing/dressing/bathroom;Assistance with cooking/housework;Assist for transportation;Help with stairs or ramp for entrance   Equipment Recommendations  None recommended by PT    Recommendations for Other Services       Precautions / Restrictions Precautions Precautions: Fall Restrictions Weight Bearing Restrictions: No     Mobility  Bed Mobility Overal bed mobility: Needs Assistance Bed Mobility: Supine to Sit     Supine to sit: Mod assist, HOB elevated     General bed mobility comments: Assist for bil LEs and for trunk to upright. Increased time. Cues required. Utilized bedpad to position pt at EOB    Transfers Overall transfer level: Needs  assistance Equipment used: Rolling walker (2 wheels) Transfers: Sit to/from Stand Sit to Stand: Mod assist, From elevated surface   Step pivot transfers: Min assist       General transfer comment: Assist to power up, stabilize, control descent. Mod posterior lean initally but with cues (verbal/manual), proper foot positioning able to stabilize enough to take steps with RW    Ambulation/Gait Ambulation/Gait assistance: Min assist, +2 safety/equipment Gait Distance (Feet): 2 Feet Assistive device: Rolling walker (2 wheels) Gait Pattern/deviations: Decreased step length - right, Decreased step length - left, Trunk flexed, Decreased stride length       General Gait Details: Pt took 1-2 steps forwards, then after pivoting, he took 1-2 steps backwards with RW. Assist to stabilize pt and manage RW. High fall risk.   Stairs             Wheelchair Mobility    Modified Rankin (Stroke Patients Only)       Balance Overall balance assessment: Needs assistance, History of Falls Sitting-balance support: Bilateral upper extremity supported, Feet supported Sitting balance-Leahy Scale: Poor     Standing balance support: Bilateral upper extremity supported, During functional activity, Reliant on assistive device for balance Standing balance-Leahy Scale: Poor                              Cognition Arousal/Alertness: Awake/alert Behavior During Therapy: Flat affect Overall Cognitive Status: Within Functional Limits for tasks assessed                                          Exercises General Exercises -  Lower Extremity Ankle Circles/Pumps: AROM, Both, 10 reps, Supine Quad Sets: AROM, Both, 5 reps, Supine Long Arc Quad: AROM, Both, 10 reps, Seated Heel Slides: AAROM, Both, 10 reps, Supine Hip ABduction/ADduction: AAROM, Both, 10 reps, Supine Hip Flexion/Marching: AROM, Both, 10 reps, Seated    General Comments        Pertinent Vitals/Pain  Pain Assessment Pain Assessment: No/denies pain    Home Living                          Prior Function            PT Goals (current goals can now be found in the care plan section) Progress towards PT goals: Progressing toward goals    Frequency    Min 2X/week      PT Plan Current plan remains appropriate    Co-evaluation              AM-PAC PT "6 Clicks" Mobility   Outcome Measure  Help needed turning from your back to your side while in a flat bed without using bedrails?: A Lot Help needed moving from lying on your back to sitting on the side of a flat bed without using bedrails?: A Lot Help needed moving to and from a bed to a chair (including a wheelchair)?: A Little Help needed standing up from a chair using your arms (e.g., wheelchair or bedside chair)?: A Lot Help needed to walk in hospital room?: A Lot Help needed climbing 3-5 steps with a railing? : Total 6 Click Score: 12    End of Session Equipment Utilized During Treatment: Gait belt Activity Tolerance: Patient tolerated treatment well;Patient limited by fatigue Patient left: in chair;with call bell/phone within reach;with chair alarm set Nurse Communication: Mobility status PT Visit Diagnosis: Muscle weakness (generalized) (M62.81);Difficulty in walking, not elsewhere classified (R26.2);History of falling (Z91.81)     Time: 1030-1100 PT Time Calculation (min) (ACUTE ONLY): 30 min  Charges:  $Gait Training: 8-22 mins $Therapeutic Activity: 8-22 mins                        Doreatha Massed, PT Acute Rehabilitation  Office: [phone]

## 2022-02-23 NOTE — TOC Progression Note (Signed)
Transition of Care [facility]) - Progression Note    Patient Details  Name: [name] Good MRN: [id number] Date of Birth: 1940/05/08  Transition of Care [facility]) CM/SW Contact  Meloni Hinz, Juliann Pulse, RN Phone Number: 02/23/2022, 11:59 AM  Clinical Narrative:Spoke to sister [name]-still awaiting choice.       Expected Discharge Plan: [place] Barriers to Discharge: Continued Medical Work up  Expected Discharge Plan and Services Expected Discharge Plan: [place]   Discharge Planning Services: CM Consult Post Acute Care Choice: [place] arrangements for the past 2 months: Single Family Home Expected Discharge Date: 02/20/22                                     Social Determinants of Health (SDOH) Interventions    Readmission Risk Interventions     No data to display

## 2022-02-24 DIAGNOSIS — F32A Depression, unspecified: Secondary | ICD-10-CM | POA: Diagnosis not present

## 2022-02-24 DIAGNOSIS — R509 Fever, unspecified: Secondary | ICD-10-CM | POA: Diagnosis not present

## 2022-02-24 DIAGNOSIS — I10 Essential (primary) hypertension: Secondary | ICD-10-CM | POA: Diagnosis not present

## 2022-02-24 DIAGNOSIS — E559 Vitamin D deficiency, unspecified: Secondary | ICD-10-CM | POA: Diagnosis not present

## 2022-02-24 DIAGNOSIS — M6259 Muscle wasting and atrophy, not elsewhere classified, multiple sites: Secondary | ICD-10-CM | POA: Diagnosis not present

## 2022-02-24 DIAGNOSIS — U071 COVID-19: Secondary | ICD-10-CM | POA: Diagnosis not present

## 2022-02-24 DIAGNOSIS — E43 Unspecified severe protein-calorie malnutrition: Secondary | ICD-10-CM | POA: Diagnosis not present

## 2022-02-24 DIAGNOSIS — Z79899 Other long term (current) drug therapy: Secondary | ICD-10-CM | POA: Diagnosis not present

## 2022-02-24 DIAGNOSIS — R531 Weakness: Secondary | ICD-10-CM | POA: Diagnosis not present

## 2022-02-24 DIAGNOSIS — S0083XS Contusion of other part of head, sequela: Secondary | ICD-10-CM | POA: Diagnosis not present

## 2022-02-24 DIAGNOSIS — M6281 Muscle weakness (generalized): Secondary | ICD-10-CM | POA: Diagnosis not present

## 2022-02-24 DIAGNOSIS — R410 Disorientation, unspecified: Secondary | ICD-10-CM | POA: Diagnosis not present

## 2022-02-24 DIAGNOSIS — H547 Unspecified visual loss: Secondary | ICD-10-CM | POA: Diagnosis not present

## 2022-02-24 DIAGNOSIS — Z8673 Personal history of transient ischemic attack (TIA), and cerebral infarction without residual deficits: Secondary | ICD-10-CM | POA: Diagnosis not present

## 2022-02-24 DIAGNOSIS — E041 Nontoxic single thyroid nodule: Secondary | ICD-10-CM | POA: Diagnosis not present

## 2022-02-24 DIAGNOSIS — R2681 Unsteadiness on feet: Secondary | ICD-10-CM | POA: Diagnosis not present

## 2022-02-24 DIAGNOSIS — E785 Hyperlipidemia, unspecified: Secondary | ICD-10-CM | POA: Diagnosis not present

## 2022-02-24 DIAGNOSIS — N320 Bladder-neck obstruction: Secondary | ICD-10-CM | POA: Diagnosis not present

## 2022-02-24 DIAGNOSIS — S0083XA Contusion of other part of head, initial encounter: Secondary | ICD-10-CM | POA: Diagnosis not present

## 2022-02-24 DIAGNOSIS — I6350 Cerebral infarction due to unspecified occlusion or stenosis of unspecified cerebral artery: Secondary | ICD-10-CM | POA: Diagnosis not present

## 2022-02-24 DIAGNOSIS — M4802 Spinal stenosis, cervical region: Secondary | ICD-10-CM | POA: Diagnosis not present

## 2022-02-24 DIAGNOSIS — R41841 Cognitive communication deficit: Secondary | ICD-10-CM | POA: Diagnosis not present

## 2022-02-24 DIAGNOSIS — Z1152 Encounter for screening for COVID-19: Secondary | ICD-10-CM | POA: Diagnosis not present

## 2022-02-24 DIAGNOSIS — D649 Anemia, unspecified: Secondary | ICD-10-CM | POA: Diagnosis not present

## 2022-02-24 DIAGNOSIS — I6529 Occlusion and stenosis of unspecified carotid artery: Secondary | ICD-10-CM | POA: Diagnosis not present

## 2022-02-24 DIAGNOSIS — Z741 Need for assistance with personal care: Secondary | ICD-10-CM | POA: Diagnosis not present

## 2022-02-24 DIAGNOSIS — Z7401 Bed confinement status: Secondary | ICD-10-CM | POA: Diagnosis not present

## 2022-02-24 DIAGNOSIS — M1990 Unspecified osteoarthritis, unspecified site: Secondary | ICD-10-CM | POA: Diagnosis not present

## 2022-02-24 DIAGNOSIS — M353 Polymyalgia rheumatica: Secondary | ICD-10-CM | POA: Diagnosis not present

## 2022-02-24 LAB — BASIC METABOLIC PANEL
Anion gap: 8 (ref 5–15)
BUN: 22 mg/dL (ref 8–23)
CO2: 29 mmol/L (ref 22–32)
Calcium: 8.8 mg/dL — ABNORMAL LOW (ref 8.9–10.3)
Chloride: 102 mmol/L (ref 98–111)
Creatinine, Ser: 0.91 mg/dL (ref 0.61–1.24)
GFR, Estimated: >60 mL/min (ref >60)
Glucose, Bld: 120 mg/dL — ABNORMAL HIGH (ref 70–99)
Potassium: 4.4 mmol/L (ref 3.5–5.1)
Sodium: 139 mmol/L (ref 135–145)

## 2022-02-24 MED ORDER — ORAL CARE MOUTH RINSE: 15.0000 mL | OROMUCOSAL | Status: DC | PRN

## 2022-02-24 MED ORDER — VITAMIN D3 25 MCG PO TABS: 5000.0000 [IU] | ORAL_TABLET | Freq: Every day | ORAL | Status: DC

## 2022-02-24 NOTE — Discharge Summary (Addendum)
Physician Discharge Summary  [name] Good ZWC:[id number] DOB: 04/22/1940 DOA: 02/17/2022  PCP: [name], MD  Admit date: 02/17/2022 Discharge date: 02/24/2022 Recommendations for Outpatient Follow-up:  Follow up with PCP in 1 weeks-call for appointment Please obtain BMP/CBC in one week  Discharge Dispo: SNF Discharge Condition: Stable Code Status:   Code Status: Full Code Diet recommendation:  Diet Order             Diet regular Room service appropriate? No; Fluid consistency: Thin  Diet effective now                    Brief/Interim Summary: 81 year old male with history of hyperlipidemia hypertension osteoarthritis lacunar stroke hyperglycemia polymyalgia rheumatica, GERD and depression who lives at home alone admitted with working diagnosis of generalized weakness  He presented to the ED with complaint of generalized weakness past several weeks, also felt lightheaded and fell hitting his head 10/31 while trying to get up without loss of consciousness. In the ED afebrile, sinus bradycardia 49.BUN normal negative for COVID CBC CMP with mild anemia thrombocytopeniaCXR-no active cardiopulmonary disease. CT head -Age-related cerebral atrophy with chronic small vessel ischemic disease, with multiple scattered remote lacunar infarcts about the bilateral basal ganglia and left cerebellum.CT cervical spine with osteopenia and degenerative changes.  Retained secretions in the trachea versus aspirate.  Carotic arthrosclerosis.  There was a 2 cm right thyroid nodule with nonemergent follow-up ultrasound recommended. chocardiogram carotid duplex -carotid atherosclerosis bilaterally.  Likely multifactorial etiology w/ generalized weakness deconditioning m significant weight loss, grieving his wife's loss.PT OT has worked and advised skilled nursing facility upon discharge.He had episode of hypotension 11/6 needing IV fluid bolus> lisinopril discontinued, renal function checked 11/7  and remained stable.  Awaiting for insurance approval for discharge    Discharge Diagnoses:  Principal Problem:   Generalized weakness Active Problems:   Hyperlipidemia   Essential hypertension   Low vitamin D level   Depression   GERD (gastroesophageal reflux disease)   Grade I diastolic dysfunction   Normocytic anemia   Thyroid nodule   Carotid atherosclerosis   Protein-calorie malnutrition, severe  Generalized weakness Fall at home In Grief-his wife passed away 15-Feb-2022: Suspect multifactorial, deconditioning, and with significant weight loss from recent passing of his wife.Work-up unremarkable, no focal weakness CT head no acute finding echocardiogram carotid duplex unremarkable.  PT OT working with him recommending skilled nursing facility.  B12 high, normal nh3, TSH and vitamin D.  Orthostatic vitals -11/4 negative. He had walked w/ his daughter and no focal weakness.   Hypotension  11/6 afternoon in 80s: Asymptomatic, improved with IV fluid bolus and infusion overnight, discontinued lisinopril.  BMP remains stable   Low-grade fever 11/5: No recurrence, chest x-ray UA done 11/6-unremarkable.  Encourage incentive spirometry-to minimize atelectasis that can cause fever   History of lacunar stroke: Has global weakness and is nonfocal Chronic grade 1 diastolic dysfunction,G1 DD history-stable BNP/euvolemic. Carotid atherosclerosis per imaging cardiac duplex unremarkable Hyperlipidemia not on statin-advised up with PCP f/u- not started given patient's weakness Hypertension discontinued lisinopril 20 mg 2/2 hypotension 11/6 Vitamin D insufficiency-started on supplement Depression on Celexa continue the same.He is grieving his wife's loss recently GERD continue PPI Thyroid nodule :US done:Mildly heterogeneous and enlarged thyroid gland.No thyroid nodule identified. Area identified on recent CT imaging likely represented a pseudo nodule Severe malnutrition augment diet as below   Nutrition Problem: Severe Malnutrition Etiology: chronic illness Signs/Symptoms: severe fat depletion, severe muscle depletion, percent weight loss Interventions:  Liberalize Diet, MVI, Boost Plus  Consults: none Subjective: Aaox3, moving all his extremities, no focal weakness Discharge Exam: Vitals:   02/23/22 2158 02/24/22 0615  BP: 137/74 (!) 142/78  Pulse: 60 69  Resp: 16 18  Temp: 98.1 F (36.7 C) (!) 97.5 F (36.4 C)  SpO2: 100% 100%   General: Pt is alert, awake, not in acute distress Cardiovascular: RRR, S1/S2 +, no rubs, no gallops Respiratory: CTA bilaterally, no wheezing, no rhonchi Abdominal: Soft, NT, ND, bowel sounds + Extremities: no edema, no cyanosis  Discharge Instructions  Discharge Instructions     Discharge instructions   Complete by: As directed    Please call call MD or return to ER for similar or worsening recurring problem that brought you to hospital or if any fever,nausea/vomiting,abdominal pain, uncontrolled pain, chest pain,  shortness of breath or any other alarming symptoms.  Please follow up with your primacy care doctor if you continue to have weight loss. May need age appropraite cancer screening. Please follow-up your doctor as instructed in a week time and call the office for appointment.  Please avoid alcohol, smoking, or any other illicit substance and maintain healthy habits including taking your regular medications as prescribed.  You were cared for by a hospitalist during your hospital stay. If you have any questions about your discharge medications or the care you received while you were in the hospital after you are discharged, you can call the unit and ask to speak with the hospitalist on call if the hospitalist that took care of you is not available.  Once you are discharged, your primary care physician will handle any further medical issues. Please note that NO REFILLS for any discharge medications will be authorized once you are  discharged, as it is imperative that you return to your primary care physician (or establish a relationship with a primary care physician if you do not have one) for your aftercare needs so that they can reassess your need for medications and monitor your lab values   Increase activity slowly   Complete by: As directed       Allergies as of 02/24/2022       Reactions   Lipitor [atorvastatin] Other (See Comments)   Uneasy feeling Myalgias    Lopid [gemfibrozil]    Unknown reaction   Penicillins Other (See Comments)   Unknown reaction   Statins Other (See Comments)   Myalgias    Valtrex [valacyclovir] Other (See Comments)   Unknown reaction   Vitamin B3 [niacin] Other (See Comments)   Unknown reaction        Medication List     STOP taking these medications    lisinopril 20 MG tablet Commonly known as: ZESTRIL   traMADol 50 MG tablet Commonly known as: ULTRAM   VITAMIN D-3 PO Replaced by: vitamin D3 25 MCG tablet       TAKE these medications    benzonatate 100 MG capsule Commonly known as: Tessalon Perles 1 tab by mouth every 6 hrs as needed for cough What changed:  how much to take how to take this when to take this reasons to take this additional instructions   Centrum Silver Adult 50+ Tabs Take 1 tablet by mouth daily.   citalopram 10 MG tablet Commonly known as: CeleXA Take 1 tablet (10 mg total) by mouth daily.   dorzolamide-timolol 2-0.5 % ophthalmic solution Commonly known as: COSOPT Place 1 drop into both eyes 2 (two) times daily.   lactose free  nutrition Liqd Take 237 mLs by mouth 3 (three) times daily with meals.   pantoprazole 40 MG tablet Commonly known as: PROTONIX Take 1 tablet (40 mg total) by mouth daily.   solifenacin 5 MG tablet Commonly known as: VESIcare Take 1 tablet (5 mg total) by mouth daily.   TURMERIC CURCUMIN PO Take 1 tablet by mouth daily.   TYLENOL PO Take 1-2 tablets by mouth daily as needed (pain, headache,  fever).   vitamin D3 25 MCG tablet Commonly known as: CHOLECALCIFEROL Take 5 tablets (5,000 Units total) by mouth daily. Start taking on: February 25, 2022 Replaces: VITAMIN D-3 PO        Contact information for follow-up providers     [name], MD Follow up in 1 week(s).   Specialties: Internal Medicine, Radiology Contact information: [place] [address] [phone]              Contact information for after-discharge care     Destination     [facility] Preferred SNF .   Service: Skilled Nursing Contact information: [address] [address] [phone]                    Allergies  Allergen Reactions   Lipitor [Atorvastatin] Other (See Comments)    Uneasy feeling Myalgias    Lopid [Gemfibrozil]     Unknown reaction   Penicillins Other (See Comments)    Unknown reaction   Statins Other (See Comments)    Myalgias    Valtrex [Valacyclovir] Other (See Comments)    Unknown reaction   Vitamin B3 [Niacin] Other (See Comments)    Unknown reaction    The results of significant diagnostics from this hospitalization (including imaging, microbiology, ancillary and laboratory) are listed below for reference.    Microbiology: Recent Results (from the past 240 hour(s))  Resp Panel by RT-PCR (Flu A&B, Covid) Anterior Nasal Swab     Status: None   Collection Time: 02/18/22  3:42 AM   Specimen: Anterior Nasal Swab  Result Value Ref Range Status   SARS Coronavirus 2 by RT PCR NEGATIVE NEGATIVE Final    Comment: (NOTE) SARS-CoV-2 target nucleic acids are NOT DETECTED.  The SARS-CoV-2 RNA is generally detectable in upper respiratory specimens during the acute phase of infection. The lowest concentration of SARS-CoV-2 viral copies this assay can detect is 138 copies/mL. A negative result does not preclude SARS-Cov-2 infection and should not be used as the sole basis for treatment  or other patient management decisions. A negative result may occur with  improper specimen collection/handling, submission of specimen other than nasopharyngeal swab, presence of viral mutation(s) within the areas targeted by this assay, and inadequate number of viral copies(<138 copies/mL). A negative result must be combined with clinical observations, patient history, and epidemiological information. The expected result is Negative.  Fact Sheet for Patients:  EntrepreneurPulse.com.au  Fact Sheet for Healthcare Providers:  IncredibleEmployment.be  This test is no t yet approved or cleared by the [place] FDA and  has been authorized for detection and/or diagnosis of SARS-CoV-2 by FDA under an Emergency Use Authorization (EUA). This EUA will remain  in effect (meaning this test can be used) for the duration of the COVID-19 declaration under Section 564(b)(1) of the Act, 21 U.S.C.section 360bbb-3(b)(1), unless the authorization is terminated  or revoked sooner.       Influenza A by PCR NEGATIVE NEGATIVE Final   Influenza B  by PCR NEGATIVE NEGATIVE Final    Comment: (NOTE) The Xpert Xpress SARS-CoV-2/FLU/RSV plus assay is intended as an aid in the diagnosis of influenza from Nasopharyngeal swab specimens and should not be used as a sole basis for treatment. Nasal washings and aspirates are unacceptable for Xpert Xpress SARS-CoV-2/FLU/RSV testing.  Fact Sheet for Patients: EntrepreneurPulse.com.au  Fact Sheet for Healthcare Providers: IncredibleEmployment.be  This test is not yet approved or cleared by the [place] FDA and has been authorized for detection and/or diagnosis of SARS-CoV-2 by FDA under an Emergency Use Authorization (EUA). This EUA will remain in effect (meaning this test can be used) for the duration of the COVID-19 declaration under Section 564(b)(1) of the Act, 21 U.S.C. section  360bbb-3(b)(1), unless the authorization is terminated or revoked.  Performed at [facility], [place] [address], [place],  [address]     Procedures/Studies: DG Chest Port 1 View  Result Date: 02/23/2022 CLINICAL DATA:  Fever. EXAM: PORTABLE CHEST 1 VIEW COMPARISON:  Chest radiographs 02/18/2022 FINDINGS: The cardiomediastinal silhouette is unchanged with normal heart size. Minimal scarring or atelectasis is noted in the left lung base. No segmental airspace consolidation, edema, sizable pleural effusion, or pneumothorax is identified. No acute osseous abnormality is seen. IMPRESSION: No active disease. Electronically Signed   By: Logan Bores M.D.   On: 02/23/2022 08:36   US THYROID  Result Date: 02/19/2022 CLINICAL DATA:  Incidental on CT. EXAM: THYROID ULTRASOUND TECHNIQUE: Ultrasound examination of the thyroid gland and adjacent soft tissues was performed. COMPARISON:  None Available. FINDINGS: Parenchymal Echotexture: Mildly heterogenous Isthmus: 0.4 cm Right lobe: 5.6 x 2.1 x 2.1 cm Left lobe: 5.3 x 2.1 x 1.8 cm _________________________________________________________ Estimated total number of nodules >/= 1 cm: 0 Number of spongiform nodules >/=  2 cm not described below (TR1): 0 Number of mixed cystic and solid nodules >/= 1.5 cm not described below (TR2): 0 _________________________________________________________ No discrete nodules are seen within the thyroid gland. IMPRESSION: Mildly heterogeneous and enlarged thyroid gland. No thyroid nodule identified. Area identified on recent CT imaging likely represented a pseudo nodule. The above is in keeping with the ACR TI-RADS recommendations - J Am Coll Radiol 2017;14:587-595. Electronically Signed   By: Jacqulynn Cadet M.D.   On: 02/19/2022 07:24   ECHOCARDIOGRAM COMPLETE  Result Date: 02/18/2022    ECHOCARDIOGRAM REPORT   Patient Name:   [name] [facility] Date of Exam: 02/18/2022 Medical Rec #:  [id number]            Height:       72.0 in Accession #:    [id number]          Weight:       139.4 lb Date of Birth:  [birth year]          BSA:          1.828 m Patient Age:    43 years            BP:           167/129 mmHg Patient Gender: M                   HR:           45 bpm. Exam Location:  Inpatient Procedure: 2D Echo Indications:    Dyspnea  History:        Patient has prior history of Echocardiogram examinations, most  recent 08/27/2017. Risk Factors:Dyslipidemia and Hypertension.  Sonographer:    Harvie Junior Referring Phys: [id number] Snowflake  Sonographer Comments: Suboptimal parasternal window and no subcostal window. Image acquisition challenging due to respiratory motion. IMPRESSIONS  1. Left ventricular ejection fraction, by estimation, is 60 to 65%. The left ventricle has normal function. The left ventricle has no regional wall motion abnormalities. There is mild asymmetric left ventricular hypertrophy of the basal-septal segment. There is chordal but not valvular systolic anterior motion. Left ventricular diastolic parameters are consistent with Grade I diastolic dysfunction (impaired relaxation).  2. Right ventricular systolic function is normal. The right ventricular size is normal. There is normal pulmonary artery systolic pressure.  3. The mitral valve is normal in structure. No evidence of mitral valve regurgitation. No evidence of mitral stenosis.  4. The aortic valve is tricuspid. There is moderate calcification of the aortic valve. Aortic valve regurgitation is not visualized. Mild aortic valve stenosis. Aortic valve mean gradient measures 15.0 mmHg.  5. Aortic dilatation noted. There is mild dilatation of the aortic root, measuring 39 mm.  6. The IVC was not visualized. FINDINGS  Left Ventricle: Left ventricular ejection fraction, by estimation, is 60 to 65%. The left ventricle has normal function. The left ventricle has no regional wall motion abnormalities. The left ventricular internal  cavity size was normal in size. There is  mild asymmetric left ventricular hypertrophy of the basal-septal segment. Left ventricular diastolic parameters are consistent with Grade I diastolic dysfunction (impaired relaxation). Right Ventricle: The right ventricular size is normal. No increase in right ventricular wall thickness. Right ventricular systolic function is normal. There is normal pulmonary artery systolic pressure. The tricuspid regurgitant velocity is 2.06 m/s, and  with an assumed right atrial pressure of 3 mmHg, the estimated right ventricular systolic pressure is 51.7 mmHg. Left Atrium: Left atrial size was normal in size. Right Atrium: Right atrial size was normal in size. Pericardium: There is no evidence of pericardial effusion. Mitral Valve: The mitral valve is normal in structure. There is mild calcification of the mitral valve leaflet(s). No evidence of mitral valve regurgitation. No evidence of mitral valve stenosis. Tricuspid Valve: The tricuspid valve is normal in structure. Tricuspid valve regurgitation is trivial. Aortic Valve: The aortic valve is tricuspid. There is moderate calcification of the aortic valve. Aortic valve regurgitation is not visualized. Mild aortic stenosis is present. Aortic valve mean gradient measures 15.0 mmHg. Aortic valve peak gradient measures 24.6 mmHg. Aortic valve area, by VTI measures 2.04 cm. Pulmonic Valve: The pulmonic valve was normal in structure. Pulmonic valve regurgitation is not visualized. Aorta: Aortic dilatation noted. There is mild dilatation of the aortic root, measuring 39 mm. Venous: The inferior vena cava was not well visualized. IAS/Shunts: No atrial level shunt detected by color flow Doppler.  LEFT VENTRICLE PLAX 2D LVIDd:         4.00 cm      Diastology LVIDs:         2.50 cm      LV e' medial:    5.98 cm/s LV PW:         1.10 cm      LV E/e' medial:  13.4 LV IVS:        1.20 cm      LV e' lateral:   6.53 cm/s LVOT diam:     1.90 cm      LV  E/e' lateral: 12.3 LV SV:         118  LV SV Index:   64 LVOT Area:     2.84 cm  LV Volumes (MOD) LV vol d, MOD A2C: 103.0 ml LV vol d, MOD A4C: 99.0 ml LV vol s, MOD A2C: 34.7 ml LV vol s, MOD A4C: 37.8 ml LV SV MOD A2C:     68.3 ml LV SV MOD A4C:     99.0 ml LV SV MOD BP:      66.1 ml RIGHT VENTRICLE RV Basal diam:  3.30 cm RV Mid diam:    3.30 cm TAPSE (M-mode): 2.7 cm LEFT ATRIUM             Index        RIGHT ATRIUM           Index LA diam:        3.20 cm 1.75 cm/m   RA Area:     14.90 cm LA Vol (A2C):   41.0 ml 22.43 ml/m  RA Volume:   36.30 ml  19.86 ml/m LA Vol (A4C):   24.6 ml 13.46 ml/m LA Biplane Vol: 32.2 ml 17.62 ml/m  AORTIC VALVE                     PULMONIC VALVE AV Area (Vmax):    1.82 cm      PV Vmax:       1.21 m/s AV Area (Vmean):   1.67 cm      PV Peak grad:  5.9 mmHg AV Area (VTI):     2.04 cm AV Vmax:           247.75 cm/s AV Vmean:          171.333 cm/s AV VTI:            0.578 m AV Peak Grad:      24.6 mmHg AV Mean Grad:      15.0 mmHg LVOT Vmax:         159.00 cm/s LVOT Vmean:        101.000 cm/s LVOT VTI:          0.415 m LVOT/AV VTI ratio: 0.72  AORTA Ao Root diam: 3.90 cm Ao Asc diam:  3.30 cm MITRAL VALVE                TRICUSPID VALVE MV Area (PHT): 2.11 cm     TR Peak grad:   17.0 mmHg MV Decel Time: 359 msec     TR Vmax:        206.00 cm/s MR Peak grad: 22.5 mmHg MR Vmax:      237.00 cm/s   SHUNTS MV E velocity: 80.10 cm/s   Systemic VTI:  0.42 m MV A velocity: 103.00 cm/s  Systemic Diam: 1.90 cm MV E/A ratio:  0.78 Dalton McleanMD Electronically signed by Franki Monte Signature Date/Time: 02/18/2022/3:00:19 PM    Final    VAS US CAROTID  Result Date: 02/18/2022 Carotid Arterial Duplex Study Patient Name:  [name] Good  Date of Exam:   02/18/2022 Medical Rec #: [id number]            Accession #:    [id number] Date of Birth: 10-13-1940           Patient Gender: M Patient Age:   44 years Exam Location:  [facility] Procedure:      VAS US CAROTID Referring  Phys: DAVID ORTIZ --------------------------------------------------------------------------------  Indications:       Stenosis. Risk Factors:  Hypertension, hyperlipidemia. Comparison Study:  No prior studies. Performing Technologist: Oliver Hum RVT  Examination Guidelines: A complete evaluation includes B-mode imaging, spectral Doppler, color Doppler, and power Doppler as needed of all accessible portions of each vessel. Bilateral testing is considered an integral part of a complete examination. Limited examinations for reoccurring indications may be performed as noted.  Right Carotid Findings: +----------+--------+--------+--------+-----------------------+--------+           PSV cm/sEDV cm/sStenosisPlaque Description     Comments +----------+--------+--------+--------+-----------------------+--------+ CCA Prox  82      15              smooth and heterogenoustortuous +----------+--------+--------+--------+-----------------------+--------+ CCA Distal63      15              smooth and heterogenous         +----------+--------+--------+--------+-----------------------+--------+ ICA Prox  54      12              smooth and heterogenous         +----------+--------+--------+--------+-----------------------+--------+ ICA Distal57      19                                     tortuous +----------+--------+--------+--------+-----------------------+--------+ ECA       76      8                                               +----------+--------+--------+--------+-----------------------+--------+ +----------+--------+-------+--------+-------------------+           PSV cm/sEDV cmsDescribeArm Pressure (mmHG) +----------+--------+-------+--------+-------------------+ [id number]                                         +----------+--------+-------+--------+-------------------+ +---------+--------+--+--------+-+---------+ VertebralPSV cm/s26EDV cm/s9Antegrade  +---------+--------+--+--------+-+---------+  Left Carotid Findings: +----------+--------+--------+--------+-----------------------+--------+           PSV cm/sEDV cm/sStenosisPlaque Description     Comments +----------+--------+--------+--------+-----------------------+--------+ CCA Prox  77      13              smooth and heterogenous         +----------+--------+--------+--------+-----------------------+--------+ CCA Distal86      15              smooth and heterogenous         +----------+--------+--------+--------+-----------------------+--------+ ICA Prox  51      15              smooth and heterogenous         +----------+--------+--------+--------+-----------------------+--------+ ICA Distal50      16                                     tortuous +----------+--------+--------+--------+-----------------------+--------+ ECA       53      9                                               +----------+--------+--------+--------+-----------------------+--------+ +----------+--------+--------+--------+-------------------+           PSV cm/sEDV cm/sDescribeArm  Pressure (mmHG) +----------+--------+--------+--------+-------------------+ Subclavian104                                         +----------+--------+--------+--------+-------------------+ +---------+--------+--+--------+-+---------+ VertebralPSV cm/s32EDV cm/s7Antegrade +---------+--------+--+--------+-+---------+   Summary: Right Carotid: Velocities in the right ICA are consistent with a 1-39% stenosis. Left Carotid: Velocities in the left ICA are consistent with a 1-39% stenosis. Vertebrals: Bilateral vertebral arteries demonstrate antegrade flow. *See table(s) above for measurements and observations.  Electronically signed by Monica Martinez MD on 02/18/2022 at 12:09:12 PM.    Final    CT Cervical Spine Wo Contrast  Result Date: 02/18/2022 CLINICAL DATA:  Neck trauma.  Fell yesterday. EXAM: CT  CERVICAL SPINE WITHOUT CONTRAST TECHNIQUE: Multidetector CT imaging of the cervical spine was performed without intravenous contrast. Multiplanar CT image reconstructions were also generated. RADIATION DOSE REDUCTION: This exam was performed according to the departmental dose-optimization program which includes automated exposure control, adjustment of the mA and/or kV according to patient size and/or use of iterative reconstruction technique. COMPARISON:  CT cervical spine 05/20/2017 FINDINGS: Alignment: Normal. Skull base and vertebrae: Osteopenia. There is no evidence of fractures or focal bone lesion. Bony ankylosis is seen across the anterior atlantodental joint. There are multiple nuchal ligament calcific bodies scattered between C5 and T2. This was seen previously. Soft tissues and spinal canal: No prevertebral fluid or swelling. No visible canal hematoma. There are moderate calcifications on the right-greater-than-left in the proximal cervical ICAs. There is a 2 cm heterogeneous nodule inferiorly in the right lobe of the thyroid gland. This was present previously but may be slightly larger today. Disc levels: There is preservation of the normal cervical disc heights apart for slight disc space loss at C3-4 C5-6. Bidirectional osteophytes are present from C3-4 through C6-7, with anterior osteophyte bridging at C3-4, C4-5 and C5-6. Posterior disc osteophyte complexes at C3-4 and C4-5 again encroach on the ventral cord surface somewhat. There are multilevel facet joint spurs and uncinate spurring. Acquired foraminal stenosis is moderate to severe on the left and moderate on the right at C3-4, bilaterally mild-to-moderate C4-5, severe on the left and moderate to severe on the right C5-6, and mild on the left at C6-7 Upper chest: There are retained secretions in the trachea, moderate amount. Corona reticulated scarring both lung apices. Other: None. IMPRESSION: 1. Osteopenia and degenerative change without  evidence of fractures. 2. Retained secretions in the trachea versus aspirate. 3. 2 cm right thyroid nodule. Nonemergent follow-up ultrasound recommended. 4. Carotid atherosclerosis. Electronically Signed   By: Telford Nab M.D.   On: 02/18/2022 05:43   DG Chest 2 View  Result Date: 02/18/2022 CLINICAL DATA:  Weakness, fall EXAM: CHEST - 2 VIEW COMPARISON:  06/03/2021 FINDINGS: Heart and mediastinal contours are within normal limits. No focal opacities or effusions. No acute bony abnormality. IMPRESSION: No active cardiopulmonary disease. Electronically Signed   By: Rolm Baptise M.D.   On: 02/18/2022 02:29   CT Head Wo Contrast  Result Date: 02/18/2022 CLINICAL DATA:  Initial evaluation for acute trauma, fall. EXAM: CT HEAD WITHOUT CONTRAST TECHNIQUE: Contiguous axial images were obtained from the base of the skull through the vertex without intravenous contrast. RADIATION DOSE REDUCTION: This exam was performed according to the departmental dose-optimization program which includes automated exposure control, adjustment of the mA and/or kV according to patient size and/or use of iterative reconstruction technique. COMPARISON:  Prior CT from 05/20/2017. FINDINGS:  Brain: Age-related cerebral atrophy with chronic small vessel ischemic disease. Multiple scattered remote lacunar infarcts present about the bilateral basal ganglia. Few small remote left cerebellar infarcts noted. No acute intracranial hemorrhage. No acute large vessel territory infarct. No mass lesion, mass effect or midline shift. No hydrocephalus or extra-axial fluid collection. Vascular: No abnormal hyperdense vessel. Calcified atherosclerosis present at skull base. Skull: No visible scalp soft tissue abnormality.  Calvarium intact. Sinuses/Orbits: Globes orbital soft tissues demonstrate no acute finding. Paranasal sinuses and mastoid air cells are largely clear. Other: None. IMPRESSION: 1. No acute intracranial abnormality. 2. Age-related  cerebral atrophy with chronic small vessel ischemic disease, with multiple scattered remote lacunar infarcts about the bilateral basal ganglia and left cerebellum. Electronically Signed   By: Jeannine Boga M.D.   On: 02/18/2022 00:07    Labs: BNP (last 3 results) Recent Labs    02/18/22 0215  BNP 846.6*   Basic Metabolic Panel: Recent Labs  Lab 02/17/22 2333 02/19/22 0505 02/23/22 1545 02/24/22 0950  NA 138 137 134* 139  K 3.9 3.5 3.9 4.4  CL 104 101 103 102  CO2 '28 27 28 29  '$ GLUCOSE 97 98 94 120*  BUN 23 15 24* 22  CREATININE 1.03 0.85 1.12 0.91  CALCIUM 8.6* 9.3 8.1* 8.8*   Liver Function Tests: Recent Labs  Lab 02/18/22 0215 02/19/22 0505 02/23/22 1545  AST '17 20 16  '$ ALT '14 14 15  '$ ALKPHOS 56 55 52  BILITOT 0.6 1.0 0.4  PROT 6.1* 6.6 5.7*  ALBUMIN 3.7 4.0 3.3*   No results for input(s): "LIPASE", "AMYLASE" in the last 168 hours. Recent Labs  Lab 02/19/22 1357  AMMONIA 22   CBC: Recent Labs  Lab 02/17/22 2333 02/19/22 0505 02/23/22 1545  WBC 3.7* 3.4* 4.3  NEUTROABS 1.3*  --   --   HGB 10.9* 12.7* 10.2*  HCT 33.6* 38.4* 31.4*  MCV 91.1 88.3 91.3  PLT 165 192 150   Cardiac Enzymes: No results for input(s): "CKTOTAL", "CKMB", "CKMBINDEX", "TROPONINI" in the last 168 hours. BNP: Invalid input(s): "POCBNP" CBG: No results for input(s): "GLUCAP" in the last 168 hours. D-Dimer No results for input(s): "DDIMER" in the last 72 hours. Hgb A1c No results for input(s): "HGBA1C" in the last 72 hours. Lipid Profile No results for input(s): "CHOL", "HDL", "LDLCALC", "TRIG", "CHOLHDL", "LDLDIRECT" in the last 72 hours. Thyroid function studies No results for input(s): "TSH", "T4TOTAL", "T3FREE", "THYROIDAB" in the last 72 hours.  Invalid input(s): "FREET3" Anemia work up No results for input(s): "VITAMINB12", "FOLATE", "FERRITIN", "TIBC", "IRON", "RETICCTPCT" in the last 72 hours. Urinalysis    Component Value Date/Time   COLORURINE STRAW (A)  02/23/2022 0742   APPEARANCEUR CLEAR 02/23/2022 0742   LABSPEC 1.009 02/23/2022 0742   PHURINE 6.0 02/23/2022 0742   GLUCOSEU NEGATIVE 02/23/2022 0742   GLUCOSEU NEGATIVE 06/03/2021 1448   HGBUR NEGATIVE 02/23/2022 0742   BILIRUBINUR NEGATIVE 02/23/2022 0742   KETONESUR NEGATIVE 02/23/2022 0742   PROTEINUR NEGATIVE 02/23/2022 0742   UROBILINOGEN 0.2 06/03/2021 1448   NITRITE NEGATIVE 02/23/2022 0742   LEUKOCYTESUR NEGATIVE 02/23/2022 0742   Sepsis Labs Recent Labs  Lab 02/17/22 2333 02/19/22 0505 02/23/22 1545  WBC 3.7* 3.4* 4.3   Microbiology Recent Results (from the past 240 hour(s))  Resp Panel by RT-PCR (Flu A&B, Covid) Anterior Nasal Swab     Status: None   Collection Time: 02/18/22  3:42 AM   Specimen: Anterior Nasal Swab  Result Value Ref Range Status  SARS Coronavirus 2 by RT PCR NEGATIVE NEGATIVE Final    Comment: (NOTE) SARS-CoV-2 target nucleic acids are NOT DETECTED.  The SARS-CoV-2 RNA is generally detectable in upper respiratory specimens during the acute phase of infection. The lowest concentration of SARS-CoV-2 viral copies this assay can detect is 138 copies/mL. A negative result does not preclude SARS-Cov-2 infection and should not be used as the sole basis for treatment or other patient management decisions. A negative result may occur with  improper specimen collection/handling, submission of specimen other than nasopharyngeal swab, presence of viral mutation(s) within the areas targeted by this assay, and inadequate number of viral copies(<138 copies/mL). A negative result must be combined with clinical observations, patient history, and epidemiological information. The expected result is Negative.  Fact Sheet for Patients:  EntrepreneurPulse.com.au  Fact Sheet for Healthcare Providers:  IncredibleEmployment.be  This test is no t yet approved or cleared by the [place] FDA and  has been authorized for  detection and/or diagnosis of SARS-CoV-2 by FDA under an Emergency Use Authorization (EUA). This EUA will remain  in effect (meaning this test can be used) for the duration of the COVID-19 declaration under Section 564(b)(1) of the Act, 21 U.S.C.section 360bbb-3(b)(1), unless the authorization is terminated  or revoked sooner.       Influenza A by PCR NEGATIVE NEGATIVE Final   Influenza B by PCR NEGATIVE NEGATIVE Final    Comment: (NOTE) The Xpert Xpress SARS-CoV-2/FLU/RSV plus assay is intended as an aid in the diagnosis of influenza from Nasopharyngeal swab specimens and should not be used as a sole basis for treatment. Nasal washings and aspirates are unacceptable for Xpert Xpress SARS-CoV-2/FLU/RSV testing.  Fact Sheet for Patients: EntrepreneurPulse.com.au  Fact Sheet for Healthcare Providers: IncredibleEmployment.be  This test is not yet approved or cleared by the [place] FDA and has been authorized for detection and/or diagnosis of SARS-CoV-2 by FDA under an Emergency Use Authorization (EUA). This EUA will remain in effect (meaning this test can be used) for the duration of the COVID-19 declaration under Section 564(b)(1) of the Act, 21 U.S.C. section 360bbb-3(b)(1), unless the authorization is terminated or revoked.  Performed at [facility], [place] [address] [place], [place] [address]   Time coordinating discharge: 25 minutes  SIGNED: Antonieta Pert, MD  [facility] 02/24/2022, 12:45 PM  If 7PM-7AM, please contact night-coverage [email or web address]

## 2022-02-24 NOTE — Progress Notes (Signed)
RN able to provide report to receiving facility, [facility] at 1450. Await transportation

## 2022-02-24 NOTE — TOC Transition Note (Addendum)
Transition of Care [facility]) - CM/SW Discharge Note   Patient Details  Name: YEHOSHUA VITELLI MRN: [id number] Date of Birth: 1941-04-19  Transition of Care [facility]) CM/SW Contact:  Dessa Phi, RN Phone Number: 02/24/2022, 12:51 PM   Clinical Narrative:Received Josem Kaufmann TM#[id number] rep Freda Munro aware/accepted. D/c summary sent-await rm#,tel# for report prior PTAR. No further CM needs.    -1:29p-going to rm#[id number],tel# report [phone]. PTAR called. No further CM needs.   Final next level of care: [place] [address] Barriers to Discharge: No Barriers Identified   Patient Goals and CMS Choice Patient states their goals for this hospitalization and ongoing recovery are::  (Home) CMS Medicare.gov Compare Post Acute Care list provided to:: Patient Represenative (must comment) Choice offered to / list presented to : Sibling  Discharge Placement              Patient chooses bed at: [facility] Patient to be transferred to facility by:  Corey Harold) Name of family member notified:  ([name](sister)) Patient and family notified of of transfer: 02/24/22  Discharge Plan and Services   Discharge Planning Services: CM Consult Post Acute Care Choice: Home Health                               Social Determinants of Health (SDOH) Interventions     Readmission Risk Interventions     No data to display

## 2022-02-24 NOTE — Progress Notes (Signed)
PROGRESS NOTE [name] Good  ZSM:[id number] DOB: [birth year] DOA: 02/17/2022 PCP: [name], MD   Brief Narrative/Hospital Course: 81 year old male with history of hyperlipidemia hypertension osteoarthritis lacunar stroke hyperglycemia polymyalgia rheumatica, GERD and depression who lives at home alone admitted with working diagnosis of generalized weakness  He presented to the ED with complaint of generalized weakness past several weeks, also felt lightheaded and fell hitting his head 10/31 while trying to get up without loss of consciousness. In the ED afebrile, bradycardia 49 BUN normal negative for COVID CBC CMP with mild anemia thrombocytopenia CXR-no active cardiopulmonary disease. CT head 2/O-no acute intracranial abnormality.chronic small vessel ischemic disease and multiple scattered remote lacunar infarcts CT cervical spine with osteopenia and degenerative changes.  Retained secretions in the trachea versus aspirate.  Carotic arthrosclerosis.  There was a 2 cm right thyroid nodule with nonemergent follow-up ultrasound recommended.  There was carotic atherosclerosis bilaterally.  Patient was admitted for further work-up Work-up has been unremarkable likely multifactorial etiology with poor oral intake generalized weakness deconditioning grieving his wife's loss.PT OT has worked and advised skilled nursing facility upon discharge. Episode of hypotension 11/6 needing IV fluid bolus lisinopril discontinued, renal function checked 11/7 and remained stable.  Awaiting for insurance approval for discharge   Subjective:  Seen and examined this morning.  Daughter at the bedside patient is alert awake no episode of hypotension blood pressure improved IV fluids bolus and infusion  Assessment and Plan: Principal Problem:   Generalized weakness Active Problems:   Hyperlipidemia   Essential hypertension   Low vitamin D level   Depression   GERD (gastroesophageal reflux disease)    Grade I diastolic dysfunction   Normocytic anemia   Thyroid nodule   Carotid atherosclerosis   Protein-calorie malnutrition, severe   Generalized weakness Fall at home In Grief-his wife passed away 02/19/22: Suspect multifactorial, deconditioning with recent passing of his wife> lost significant weight work-up unremarkable, no focal weakness CT head no acute finding echocardiogram carotid duplex unremarkable.  PT OT working with him recommending skilled nursing facility.  B12 high, normal nh3, TSH and vitamin D.  Orthostatic vitals -11/4 negative, although could not stand too long  Hypotension  11/6 afternoon in 80s: Asymptomatic, improved with IV fluid bolus and infusion overnight, discontinued lisinopril.  BMP remains stable  Low-grade fever 11/5: No recurrence, chest x-ray UA done 11/6-unremarkable.  Encourage incentive spirometry-to minimize atelectasis that can cause fever  History of lacunar stroke: Has global weakness and is nonfocal Chronic grade 1 diastolic dysfunction,G1 DD history-stable BNP/euvolemic. Carotid atherosclerosis per imaging cardiac duplex unremarkable Hyperlipidemia not on statin-advised up with PCP f/u- not started given patient's weakness Hypertension discontinued lisinopril 20 mg 2/2 hypotension 11/6 Vitamin D insufficiency-started on supplement Depression on Celexa continue the same.He is grieving his wife's loss recently GERD continue PPI Thyroid nodule will need follow-up with ultrasound as OP Severe malnutrition augment diet as below  Nutrition Problem: Severe Malnutrition Etiology: chronic illness Signs/Symptoms: severe fat depletion, severe muscle depletion, percent weight loss Interventions: Liberalize Diet, MVI, Boost Plus  DVT prophylaxis: Place and maintain sequential compression device Start: 02/19/22 1640 SCDs Start: 02/18/22 0857 Code Status:   Code Status: Full Code Family Communication: plan of care discussed with patient /his daughter at  bedside.  Patient status is: Remains observation status  Level of care: Telemetry  Dispo: The patient is from: home lives alone reports his sister is the POA-was updated previously.  Daughter was updated previously, sister looking into placement. Again daughter updated  at the bed side            Anticipated disposition: awaiting SNF insurance approval  Mobility Assessment (last 72 hours)     Mobility Assessment     Row Name 02/23/22 2000 02/23/22 1223 02/22/22 2255 02/22/22 2254 02/21/22 2156   Does patient have an order for bedrest or is patient medically unstable No - Continue assessment -- No - Continue assessment No - Continue assessment No - Continue assessment   What is the highest level of mobility based on the progressive mobility assessment? Level 3 (Stands with assist) - Balance while standing  and cannot march in place Level 3 (Stands with assist) - Balance while standing  and cannot march in place Level 3 (Stands with assist) - Balance while standing  and cannot march in place Level 3 (Stands with assist) - Balance while standing  and cannot march in place Level 3 (Stands with assist) - Balance while standing  and cannot march in place   Is the above level different from baseline mobility prior to current illness? -- -- Yes - Recommend PT order Yes - Recommend PT order Yes - Recommend PT order           Objective: Vitals last 24 hrs: Vitals:   02/23/22 1411 02/23/22 1540 02/23/22 2158 02/24/22 0615  BP: (!) 81/49 (!) 96/59 137/74 (!) 142/78  Pulse: 71 78 60 69  Resp: '20  16 18  '$ Temp: 97.8 F (36.6 C)  98.1 F (36.7 C) (!) 97.5 F (36.4 C)  TempSrc: Oral  Oral Oral  SpO2: 100% 100% 100% 100%  Weight:      Height:       Weight change:   Physical Examination: General exam: AAOX3, weak,older appearing HEENT:Oral mucosa moist, Ear/Nose WNL grossly, dentition normal. Respiratory system: bilaterally CLEAR BS, no use of accessory muscle Cardiovascular system: S1 & S2 +,  regular rate. Gastrointestinal system: Abdomen soft,NT,ND,BS+ Nervous System:Alert, awake, moving extremities and grossly nonfocal Extremities: LE ankle edema NEG, lower extremities warm Skin: No rashes,no icterus. MSK: Normal muscle bulk,tone, power   Medications reviewed:  Scheduled Meds:  cholecalciferol  5,000 Units Oral Daily   citalopram  10 mg Oral Daily   dorzolamide-timolol  1 drop Both Eyes BID   fesoterodine  4 mg Oral Daily   lactose free nutrition  237 mL Oral TID WC   multivitamin with minerals  1 tablet Oral Daily   pantoprazole  40 mg Oral Daily  Continuous Infusions:  lactated ringers 100 mL/hr at 02/24/22 0600     Diet Order             Diet regular Room service appropriate? No; Fluid consistency: Thin  Diet effective now                  Intake/Output Summary (Last 24 hours) at 02/24/2022 1059 Last data filed at 02/24/2022 0745 Gross per 24 hour  Intake 2830.42 ml  Output 2850 ml  Net -19.58 ml   Net IO Since Admission: -4,599.58 mL [02/24/22 1059]  Wt Readings from Last 3 Encounters:  02/18/22 57.4 kg  11/17/21 63.2 kg  09/01/21 64.4 kg    Unresulted Labs (From admission, onward)    None     Data Reviewed: I have personally reviewed following labs and imaging studies CBC: Recent Labs  Lab 02/17/22 2333 02/19/22 0505 02/23/22 1545  WBC 3.7* 3.4* 4.3  NEUTROABS 1.3*  --   --   HGB 10.9* 12.7* 10.2*  HCT 33.6* 38.4* 31.4*  MCV 91.1 88.3 91.3  PLT 165 192 416    Basic Metabolic Panel: Recent Labs  Lab 02/17/22 2333 02/19/22 0505 02/23/22 1545 02/24/22 0950  NA 138 137 134* 139  K 3.9 3.5 3.9 4.4  CL 104 101 103 102  CO2 '28 27 28 29  '$ GLUCOSE 97 98 94 120*  BUN 23 15 24* 22  CREATININE 1.03 0.85 1.12 0.91  CALCIUM 8.6* 9.3 8.1* 8.8*   GFR: Estimated Creatinine Clearance: 51.7 mL/min (by C-G formula based on SCr of 0.91 mg/dL). Liver Function Tests: Recent Labs  Lab 02/18/22 0215 02/19/22 0505 02/23/22 1545  AST '17 20  16  '$ ALT '14 14 15  '$ ALKPHOS 56 55 52  BILITOT 0.6 1.0 0.4  PROT 6.1* 6.6 5.7*  ALBUMIN 3.7 4.0 3.3*    No results for input(s): "TSH", "T4TOTAL", "FREET4", "T3FREE", "THYROIDAB" in the last 72 hours. Culture/Microbiology No results found for: "SDES", "[place]", "CULT", "REPTSTATUS"  Other culture-see note  Radiology Studies: DG Chest Port 1 View  Result Date: 02/23/2022 CLINICAL DATA:  Fever. EXAM: PORTABLE CHEST 1 VIEW COMPARISON:  Chest radiographs 02/18/2022 FINDINGS: The cardiomediastinal silhouette is unchanged with normal heart size. Minimal scarring or atelectasis is noted in the left lung base. No segmental airspace consolidation, edema, sizable pleural effusion, or pneumothorax is identified. No acute osseous abnormality is seen. IMPRESSION: No active disease. Electronically Signed   By: Logan Bores M.D.   On: 02/23/2022 08:36     LOS: 0 days   Antonieta Pert, MD [facility]  02/24/2022, 10:59 AM

## 2022-02-25 DIAGNOSIS — I10 Essential (primary) hypertension: Secondary | ICD-10-CM | POA: Diagnosis not present

## 2022-02-25 DIAGNOSIS — R531 Weakness: Secondary | ICD-10-CM | POA: Diagnosis not present

## 2022-02-25 DIAGNOSIS — D649 Anemia, unspecified: Secondary | ICD-10-CM | POA: Diagnosis not present

## 2022-02-25 DIAGNOSIS — E785 Hyperlipidemia, unspecified: Secondary | ICD-10-CM | POA: Diagnosis not present

## 2022-02-26 DIAGNOSIS — R531 Weakness: Secondary | ICD-10-CM | POA: Diagnosis not present

## 2022-02-26 DIAGNOSIS — E785 Hyperlipidemia, unspecified: Secondary | ICD-10-CM | POA: Diagnosis not present

## 2022-02-26 DIAGNOSIS — I10 Essential (primary) hypertension: Secondary | ICD-10-CM | POA: Diagnosis not present

## 2022-03-02 DIAGNOSIS — F32A Depression, unspecified: Secondary | ICD-10-CM | POA: Diagnosis not present

## 2022-03-02 DIAGNOSIS — I6529 Occlusion and stenosis of unspecified carotid artery: Secondary | ICD-10-CM | POA: Diagnosis not present

## 2022-03-02 DIAGNOSIS — E43 Unspecified severe protein-calorie malnutrition: Secondary | ICD-10-CM | POA: Diagnosis not present

## 2022-03-02 DIAGNOSIS — E041 Nontoxic single thyroid nodule: Secondary | ICD-10-CM | POA: Diagnosis not present

## 2022-03-08 DIAGNOSIS — E785 Hyperlipidemia, unspecified: Secondary | ICD-10-CM | POA: Diagnosis not present

## 2022-03-08 DIAGNOSIS — E041 Nontoxic single thyroid nodule: Secondary | ICD-10-CM | POA: Diagnosis not present

## 2022-03-08 DIAGNOSIS — E43 Unspecified severe protein-calorie malnutrition: Secondary | ICD-10-CM | POA: Diagnosis not present

## 2022-03-09 ENCOUNTER — Telehealth: Payer: Self-pay | Admitting: Internal Medicine

## 2022-03-09 NOTE — Telephone Encounter (Signed)
Patient is presently in [facility] - he had a fall a couple of weeks ago.  Patient is going to need to be referred to a place to stay or either home health care.  Patient's brother wants to know what Dr. Jenny Reichmann would recommend.

## 2022-03-10 NOTE — Telephone Encounter (Signed)
Spoke with patients sister Vickii Chafe who states that [facility] will discharge patient in a few days but he has contracted covid since being in the facility. He is in need of a referral for a home health aid to help with meds, feeding and bathing. Patient currently lives on his own, please advise.

## 2022-03-10 NOTE — Telephone Encounter (Signed)
Ok to let son know we can order Standard but the rule is that pt has to have an in person office visit to qualify to have this done; please assist with making ROV

## 2022-03-11 DIAGNOSIS — M353 Polymyalgia rheumatica: Secondary | ICD-10-CM | POA: Diagnosis not present

## 2022-03-11 DIAGNOSIS — M4802 Spinal stenosis, cervical region: Secondary | ICD-10-CM | POA: Diagnosis not present

## 2022-03-11 DIAGNOSIS — S0083XS Contusion of other part of head, sequela: Secondary | ICD-10-CM | POA: Diagnosis not present

## 2022-03-11 DIAGNOSIS — E43 Unspecified severe protein-calorie malnutrition: Secondary | ICD-10-CM | POA: Diagnosis not present

## 2022-03-11 DIAGNOSIS — M6259 Muscle wasting and atrophy, not elsewhere classified, multiple sites: Secondary | ICD-10-CM | POA: Diagnosis not present

## 2022-03-11 DIAGNOSIS — F32A Depression, unspecified: Secondary | ICD-10-CM | POA: Diagnosis not present

## 2022-03-11 DIAGNOSIS — I6350 Cerebral infarction due to unspecified occlusion or stenosis of unspecified cerebral artery: Secondary | ICD-10-CM | POA: Diagnosis not present

## 2022-03-11 DIAGNOSIS — D649 Anemia, unspecified: Secondary | ICD-10-CM | POA: Diagnosis not present

## 2022-03-11 DIAGNOSIS — E785 Hyperlipidemia, unspecified: Secondary | ICD-10-CM | POA: Diagnosis not present

## 2022-03-13 DIAGNOSIS — U071 COVID-19: Secondary | ICD-10-CM | POA: Diagnosis not present

## 2022-03-16 DIAGNOSIS — U071 COVID-19: Secondary | ICD-10-CM | POA: Diagnosis not present

## 2022-03-17 ENCOUNTER — Ambulatory Visit: Payer: Medicare HMO | Admitting: Internal Medicine

## 2022-03-17 NOTE — Telephone Encounter (Signed)
ROV scheduled for 12/14

## 2022-03-18 DIAGNOSIS — I6350 Cerebral infarction due to unspecified occlusion or stenosis of unspecified cerebral artery: Secondary | ICD-10-CM | POA: Diagnosis not present

## 2022-03-18 DIAGNOSIS — E785 Hyperlipidemia, unspecified: Secondary | ICD-10-CM | POA: Diagnosis not present

## 2022-03-18 DIAGNOSIS — S0083XS Contusion of other part of head, sequela: Secondary | ICD-10-CM | POA: Diagnosis not present

## 2022-03-18 DIAGNOSIS — M6259 Muscle wasting and atrophy, not elsewhere classified, multiple sites: Secondary | ICD-10-CM | POA: Diagnosis not present

## 2022-03-18 DIAGNOSIS — F32A Depression, unspecified: Secondary | ICD-10-CM | POA: Diagnosis not present

## 2022-03-18 DIAGNOSIS — M353 Polymyalgia rheumatica: Secondary | ICD-10-CM | POA: Diagnosis not present

## 2022-03-18 DIAGNOSIS — E43 Unspecified severe protein-calorie malnutrition: Secondary | ICD-10-CM | POA: Diagnosis not present

## 2022-03-18 DIAGNOSIS — M4802 Spinal stenosis, cervical region: Secondary | ICD-10-CM | POA: Diagnosis not present

## 2022-03-18 DIAGNOSIS — D649 Anemia, unspecified: Secondary | ICD-10-CM | POA: Diagnosis not present

## 2022-03-19 DIAGNOSIS — R531 Weakness: Secondary | ICD-10-CM | POA: Diagnosis not present

## 2022-03-19 DIAGNOSIS — E785 Hyperlipidemia, unspecified: Secondary | ICD-10-CM | POA: Diagnosis not present

## 2022-03-19 DIAGNOSIS — F32A Depression, unspecified: Secondary | ICD-10-CM | POA: Diagnosis not present

## 2022-03-23 ENCOUNTER — Telehealth: Payer: Self-pay | Admitting: Internal Medicine

## 2022-03-23 NOTE — Telephone Encounter (Signed)
[facility] needs verbal orders for home health/physical therapy - 1 week x 10 and speech therapy evalutation.

## 2022-03-23 NOTE — Telephone Encounter (Signed)
LVM for Anda Kraft with the verbal OK for home health/physical therapy - 1 week x 10 and speech therapy evalutation.

## 2022-03-23 NOTE — Telephone Encounter (Signed)
Ok for verbal 

## 2022-04-02 ENCOUNTER — Ambulatory Visit (INDEPENDENT_AMBULATORY_CARE_PROVIDER_SITE_OTHER): Payer: Medicare HMO | Admitting: Internal Medicine

## 2022-04-02 VITALS — BP 130/76 | HR 69 | Temp 97.9°F | Ht 72.0 in | Wt 141.0 lb

## 2022-04-02 DIAGNOSIS — R296 Repeated falls: Secondary | ICD-10-CM

## 2022-04-02 DIAGNOSIS — M353 Polymyalgia rheumatica: Secondary | ICD-10-CM

## 2022-04-02 DIAGNOSIS — I10 Essential (primary) hypertension: Secondary | ICD-10-CM

## 2022-04-02 DIAGNOSIS — R627 Adult failure to thrive: Secondary | ICD-10-CM

## 2022-04-02 DIAGNOSIS — E041 Nontoxic single thyroid nodule: Secondary | ICD-10-CM | POA: Diagnosis not present

## 2022-04-02 DIAGNOSIS — R251 Tremor, unspecified: Secondary | ICD-10-CM

## 2022-04-02 DIAGNOSIS — N3943 Post-void dribbling: Secondary | ICD-10-CM | POA: Diagnosis not present

## 2022-04-02 DIAGNOSIS — K219 Gastro-esophageal reflux disease without esophagitis: Secondary | ICD-10-CM | POA: Diagnosis not present

## 2022-04-02 DIAGNOSIS — R269 Unspecified abnormalities of gait and mobility: Secondary | ICD-10-CM

## 2022-04-02 DIAGNOSIS — F32A Depression, unspecified: Secondary | ICD-10-CM

## 2022-04-02 DIAGNOSIS — R32 Unspecified urinary incontinence: Secondary | ICD-10-CM

## 2022-04-02 NOTE — Telephone Encounter (Signed)
[facility] for verbal as mentioned for ST

## 2022-04-02 NOTE — Telephone Encounter (Signed)
Verbal orders for Speech Therapy for 1 time a week for 6 weeks, requested

## 2022-04-02 NOTE — Telephone Encounter (Signed)
[name] Good called back and Requested Speech Therapy for 1 time a week for 6 weeks, Call back is [phone] Secure line

## 2022-04-02 NOTE — Progress Notes (Signed)
Patient ID: [name] Good, male   DOB: [birth year], 81 y.o.   MRN: [id number]        Chief Complaint: follow up thyroid nodule, urinary incontinence, depression, gerd, FTT, tremor       HPI:  [name] Good is a 81 y.o. male here with several complaints, s/p recent hospn oct 31 - nov 7 with FTT, dizziness with fall; CT head with multiple old lacunar infarcts.  Incidental finding of right thyroid 2 cm nodule now for f/u u/s.  Hypotension improved with IVF.  D/c to [facility], now home for the past wk.  Has mild persistent mild edema left > right without worsening or change in wt he is aware.  Has gained 15 lbs by record but still appears thin malnourished.  Denies worsening depressive symptoms, suicidal ideation, or panic; in fact quit his celexa x 2 wks and declines restart at this time. Denies worsening reflux, abd pain, dysphagia, n/v, bowel change or blood, would like to try off the PPI as well to simplify his meds.  Family with him asks for endo referral and neurology Dr [name] Good for persistent worsening tremor.  Pt conts to have home PT   BP at home has been low normal.  No other new complaints.  Has ongoing urinary incontinence but missed his urology appt, and states will call to reschedule.          Wt Readings from Last 3 Encounters:  04/02/22 141 lb (64 kg)  02/18/22 126 lb 8.7 oz (57.4 kg)  11/17/21 139 lb 6.4 oz (63.2 kg)   BP Readings from Last 3 Encounters:  04/02/22 130/76  02/24/22 124/79  11/17/21 138/74         Past Medical History:  Diagnosis Date   Balanitis    hx of   Cataract    Glaucoma    Hx of adenomatous polyp of colon 03/09/2014   Hyperlipidemia    Hypertension    Osteoarthritis    diffuse   Stroke [facility])    pt states ? when had   Past Surgical History:  Procedure Laterality Date   CATARACT EXTRACTION     COLONOSCOPY  06-05-2003   hems only   TONSILLECTOMY      reports that he quit smoking about 44 years ago. His smoking use included  cigarettes. He has a 20.00 pack-year smoking history. He has never been exposed to tobacco smoke. He has never used smokeless tobacco. He reports that he does not drink alcohol and does not use drugs. family history includes Cancer in his father; Diabetes in his brother; Other (age of onset: 82) in his mother. Allergies  Allergen Reactions   Lipitor [Atorvastatin] Other (See Comments)    Uneasy feeling Myalgias    Lopid [Gemfibrozil]     Unknown reaction   Penicillins Other (See Comments)    Unknown reaction   Statins Other (See Comments)    Myalgias    Valtrex [Valacyclovir] Other (See Comments)    Unknown reaction   Vitamin B3 [Niacin] Other (See Comments)    Unknown reaction   Current Outpatient Medications on File Prior to Visit  Medication Sig Dispense Refill   Acetaminophen (TYLENOL PO) Take 1-2 tablets by mouth daily as needed (pain, headache, fever).     benzonatate (TESSALON PERLES) 100 MG capsule 1 tab by mouth every 6 hrs as needed for cough (Patient taking differently: Take 100 mg by mouth every 6 (six) hours as needed for cough.) 60 capsule 2  cholecalciferol (CHOLECALCIFEROL) 25 MCG tablet Take 5 tablets (5,000 Units total) by mouth daily.     dorzolamide-timolol (COSOPT) 22.3-6.8 MG/ML ophthalmic solution Place 1 drop into both eyes 2 (two) times daily.     lactose free nutrition (BOOST PLUS) LIQD Take 237 mLs by mouth 3 (three) times daily with meals.  0   Multiple Vitamins-Minerals (CENTRUM SILVER ADULT 50+) TABS Take 1 tablet by mouth daily.     solifenacin (VESICARE) 5 MG tablet Take 1 tablet (5 mg total) by mouth daily. 90 tablet 3   TURMERIC CURCUMIN PO Take 1 tablet by mouth daily.     pantoprazole (PROTONIX) 40 MG tablet Take 1 tablet (40 mg total) by mouth daily. (Patient not taking: Reported on 04/02/2022) 90 tablet 3   No current facility-administered medications on file prior to visit.        ROS:  All others reviewed and negative.  Objective        PE:   BP 130/76 (BP Location: Left Arm, Patient Position: Sitting, Cuff Size: Large)   Pulse 69   Temp 97.9 F (36.6 C) (Oral)   Ht 6' (1.829 m)   Wt 141 lb (64 kg)   SpO2 94%   BMI 19.12 kg/m                 Constitutional: Pt appears in NAD               HENT: Head: NCAT.                Right Ear: External ear normal.                 Left Ear: External ear normal.                Eyes: . Pupils are equal, round, and reactive to light. Conjunctivae and EOM are normal               Nose: without d/c or deformity               Neck: Neck supple. Gross normal ROM               Cardiovascular: Normal rate and regular rhythm.                 Pulmonary/Chest: Effort normal and breath sounds without rales or wheezing.                Abd:  Soft, NT, ND, + BS, no organomegaly               Neurological: Pt is alert. At baseline orientation, motor grossly intact               Skin: Skin is warm. No rashes, no other new lesions, LE edema - trace bilateral left > right               Psychiatric: Pt behavior is normal without agitation   Micro: none  Cardiac tracings I have personally interpreted today:  none  Pertinent Radiological findings (summarize): none   Lab Results  Component Value Date   WBC 4.3 02/23/2022   HGB 10.2 (L) 02/23/2022   HCT 31.4 (L) 02/23/2022   PLT 150 02/23/2022   GLUCOSE 120 (H) 02/24/2022   CHOL 216 (H) 09/01/2021   TRIG 106.0 09/01/2021   HDL 67.30 09/01/2021   LDLDIRECT 123.0 01/12/2017   LDLCALC 128 (H) 09/01/2021   ALT 15 02/23/2022   AST  16 02/23/2022   NA 139 02/24/2022   K 4.4 02/24/2022   CL 102 02/24/2022   CREATININE 0.91 02/24/2022   BUN 22 02/24/2022   CO2 29 02/24/2022   TSH 1.018 02/19/2022   PSA 3.83 06/03/2021   INR 1.1 (H) 06/03/2021   HGBA1C 5.7 09/01/2021   Assessment/Plan:  [name] Good is a 81 y.o. Black or African American [2] male with  has a past medical history of Balanitis, Cataract, Glaucoma, adenomatous polyp of colon  (03/09/2014), Hyperlipidemia, Hypertension, Osteoarthritis, and Stroke ([place]).  Essential hypertension BP Readings from Last 3 Encounters:  04/02/22 130/76  02/24/22 124/79  11/17/21 138/74   Stable, pt to continue medical treatment - diet, exercise - no med tx needed   Thyroid nodule Lab Results  Component Value Date   TSH 1.018 02/19/2022   Pt now for thyroid u/s, refer endo  Urinary incontinence, post-void dribbling Pt states will reschedule with urology, repeat referral not needed  Polymyalgia rheumatica ([place]) Lab Results  Component Value Date   ESRSEDRATE 26 (H) 06/03/2021   Stable, cont to follow  GERD (gastroesophageal reflux disease) Ok for trial off PPI for now  Depression Pt declines further SSRI for now or psychiatry referral,  to f/u any worsening symptoms or concerns  Tremor Also for referral neurology as per request  FTT (failure to thrive) in adult To continue home PT  Followup: Return in about 4 months (around 08/02/2022).  Cathlean Cower, MD 04/05/2022 1:05 PM [facility]

## 2022-04-02 NOTE — Patient Instructions (Signed)
Please continue all other medications as before, but not to take the celexa and protonix as you have stopped these, and no further BP medication for now  Please have the pharmacy call with any other refills you may need.  Please continue your efforts at being more active, low cholesterol diet, and weight control.  Please keep your appointments with your specialists as you may have planned  Home PT, OT and speech therapy  You will be contacted regarding the referral for: Neurology Dr Delice Lesch, Endo Dr Gabriel Rung will be contacted regarding the referral for: thyroid ultrasound  We can hold on further lab testing today  Please make an Appointment to return in 4 months, or sooner if needed

## 2022-04-03 NOTE — Telephone Encounter (Signed)
[name] Good from [place] given ok for verbal orders for ST via secure voicemail

## 2022-04-05 ENCOUNTER — Encounter: Payer: Self-pay | Admitting: Internal Medicine

## 2022-04-05 DIAGNOSIS — R627 Adult failure to thrive: Secondary | ICD-10-CM | POA: Insufficient documentation

## 2022-04-05 DIAGNOSIS — R251 Tremor, unspecified: Secondary | ICD-10-CM | POA: Insufficient documentation

## 2022-04-05 NOTE — Assessment & Plan Note (Signed)
Lab Results  Component Value Date   ESRSEDRATE 26 (H) 06/03/2021   Stable, cont to follow

## 2022-04-05 NOTE — Assessment & Plan Note (Signed)
BP Readings from Last 3 Encounters:  04/02/22 130/76  02/24/22 124/79  11/17/21 138/74   Stable, pt to continue medical treatment - diet, exercise - no med tx needed

## 2022-04-05 NOTE — Assessment & Plan Note (Signed)
Ok for trial off PPI for now

## 2022-04-05 NOTE — Assessment & Plan Note (Signed)
To continue home PT

## 2022-04-05 NOTE — Assessment & Plan Note (Signed)
Pt declines further SSRI for now or psychiatry referral,  to f/u any worsening symptoms or concerns

## 2022-04-05 NOTE — Assessment & Plan Note (Addendum)
Lab Results  Component Value Date   TSH 1.018 02/19/2022   Pt now for thyroid u/s, refer endo

## 2022-04-05 NOTE — Assessment & Plan Note (Signed)
Pt states will reschedule with urology, repeat referral not needed

## 2022-04-05 NOTE — Assessment & Plan Note (Signed)
Also for referral neurology as per request

## 2022-04-08 ENCOUNTER — Encounter: Payer: Self-pay | Admitting: Neurology

## 2022-04-16 ENCOUNTER — Ambulatory Visit
Admission: RE | Admit: 2022-04-16 | Discharge: 2022-04-16 | Disposition: A | Discharge status: Home / Self Care | Payer: Medicare HMO | Source: Ambulatory Visit | Attending: Internal Medicine | Admitting: Internal Medicine

## 2022-04-16 DIAGNOSIS — E049 Nontoxic goiter, unspecified: Secondary | ICD-10-CM | POA: Diagnosis not present

## 2022-04-16 DIAGNOSIS — E041 Nontoxic single thyroid nodule: Secondary | ICD-10-CM

## 2022-04-18 ENCOUNTER — Encounter: Payer: Self-pay | Admitting: Internal Medicine

## 2022-04-23 ENCOUNTER — Telehealth: Payer: Self-pay | Admitting: Internal Medicine

## 2022-04-23 DIAGNOSIS — K469 Unspecified abdominal hernia without obstruction or gangrene: Secondary | ICD-10-CM

## 2022-04-23 NOTE — Telephone Encounter (Signed)
Patient would like to proceed with referral to Dr Coralie Keens at [facility] for his Hernia as previously discussed at last visit

## 2022-04-23 NOTE — Telephone Encounter (Signed)
Ok this is done 

## 2022-04-23 NOTE — Telephone Encounter (Signed)
Patients daughter called and said patient was requesting a referral be sent into Dr Coralie Keens at [facility] for his Hernia. She stated he had seen Dr Jenny Reichmann recently about it

## 2022-04-23 NOTE — Progress Notes (Signed)
Assessment/Plan:    Parkinson's disease, likely idiopathic.  The patient has tremor, advanced bradykinesia, rigidity and postural instability. -Patient does have evidence of bilateral basal ganglia infarcts, so vascular parkinsonism cannot be ruled out.  However, clinically, the patient really does not look like he has vascular parkinsonism. -Patient's disease is quite advanced for new diagnosis and I suspect that he has had this for quite some time (years).  We discussed this today.  -We discussed the diagnosis as well as pathophysiology of the disease.  We discussed treatment options as well as prognostic indicators.  Patient education was provided.  -We discussed that it used to be thought that levodopa would increase risk of melanoma but now it is believed that Parkinsons itself likely increases risk of melanoma. he is to get regular skin checks.  -Greater than 50% of the 60 minute visit was spent in counseling answering questions and talking about what to expect now as well as in the future.  We talked about medication options as well as potential future surgical options.  We talked about safety in the home.  -We decided to add carbidopa/levodopa 25/100.  1/2 tab tid x 1 wk, then 1/2 in am & noon & 1 at night for a week, then 1/2 in am &1 at noon &night for a week, then 1 po tid.  Risks, benefits, side effects and alternative therapies were discussed.  The opportunity to ask questions was given and they were answered to the best of my ability.  The patient expressed understanding and willingness to follow the outlined treatment protocols.  -He's doing PT with [facility] right now and doesn't wish to do it at neurorehab.  He very much needs speech therapy and I will call them to see if they are doing this.  2.  Sialorrhea  -This is commonly associated with PD.  We talked about treatments.  The patient is not a candidate for oral anticholinergic therapy because of increased risk of confusion and  falls.  We discussed Botox (type A and B) and 1% atropine drops.  We discusssed that candy like lemon drops can help by stimulating mm of the oropharynx to induce swallowing.  He has fairly significant drooling in the office and carried a drool cloth.  He wanted to think about options, including Botox.  3.  Eyelid opening apraxia  -discussed this can be associated since Parkinsons Disease   -Discussed Botox.  He will think about his options.  Discussed with him that he can do Botox for drooling even if he decides not to do it for eyelid opening apraxia.   Subjective:   [name] Good was seen today in the movement disorders clinic for neurologic consultation at the request of [name], MD.  Pt with sister and his caretaker, tiffany, who supplements the history.  The consultation is for the evaluation of tremor and falls.  Medical records made available to me are reviewed.  Patient was in the hospital in early November for generalized weakness.  This was ultimately worked up and felt to be multifactorial due to weight loss and significant deconditioning.  Patient was grieving his wife's death.  Patient had episode of hypotension in the hospital.  Physical therapy in the hospital noted decreased stride length, flat affect and leaning posteriorly when he would stand.  He followed up with primary care and family requested neurology referral for "persistent worsening tremor."  Notes indicate that patient does have history of polymyalgia rheumatica (last sed rate  noted was 26).   Specific Symptoms:  Tremor: Yes.  , unknown how long but at least since 01/2022.  R hand only.   Family hx of similar:  No. Voice: 6 months its been soft Sleep:   Vivid Dreams:  No.  Acting out dreams:  No. Wet Pillows: Yes.   Postural symptoms:  "no problem"  Falls?  Only 1 fall - it was the one back in 01/2022 that led to hospital stay - he went to go get a drink of water and fell and thinks he may have passed  out.  Sister found him on the floor awake Bradykinesia symptoms: drooling while awake and difficulty getting out of a chair; he denies shuffle but caregiver says he does shuffle in the AM and evening.  No ambulatory assist device Loss of smell:  No. Loss of taste:  No. Urinary Incontinence:  Yes.   Difficulty Swallowing:  No. Trouble with ADL's:  Yes.  , needs assist at least for 1 year  Trouble buttoning clothing: Yes.   Memory changes:  No. Hallucinations:  No.  visual distortions: No. N/V:  No. Lightheaded:  No.  Syncope: No. Diplopia:  No. Dyskinesia:  No. Prior exposure to reglan/antipsychotics: No.  Doesn't drive.  Quit 5 years ago due to poor eyesight.  Lives alone.  Has a caregiver for 24 hours/day since hospital stay in Nov.  Neuroimaging of the brain has  previously been performed.  CT brain done February 17, 2022 when patient was in the hospital.  There was evidence of chronic small vessel disease and scattered lacunar infarctions in bilateral basal ganglia and left cerebellum.  PREVIOUS MEDICATIONS: none to date  ALLERGIES:   Allergies  Allergen Reactions   Lipitor [Atorvastatin] Other (See Comments)    Uneasy feeling Myalgias    Lopid [Gemfibrozil]     Unknown reaction   Penicillins Other (See Comments)    Unknown reaction   Statins Other (See Comments)    Myalgias    Valtrex [Valacyclovir] Other (See Comments)    Unknown reaction   Vitamin B3 [Niacin] Other (See Comments)    Unknown reaction    CURRENT MEDICATIONS:  Current Meds  Medication Sig   Acetaminophen (TYLENOL PO) Take 1-2 tablets by mouth daily as needed (pain, headache, fever).   carbidopa-levodopa (SINEMET IR) 25-100 MG tablet Take 1 tablet by mouth 3 (three) times daily. 8am/noon/4pm   dorzolamide-timolol (COSOPT) 22.3-6.8 MG/ML ophthalmic solution Place 1 drop into both eyes 2 (two) times daily.   lactose free nutrition (BOOST PLUS) LIQD Take 237 mLs by mouth 3 (three) times daily with meals.    Multiple Vitamins-Minerals (CENTRUM SILVER ADULT 50+) TABS Take 1 tablet by mouth daily.   solifenacin (VESICARE) 5 MG tablet Take 1 tablet (5 mg total) by mouth daily.   TURMERIC CURCUMIN PO Take 1 tablet by mouth daily.     Objective:   VITALS:   Vitals:   04/27/22 1433  BP: 124/76  Pulse: 64  SpO2: 99%  Weight: 146 lb 3.2 oz (66.3 kg)  Height: '5\' 11"'$  (1.803 m)    GEN:  The patient appears stated age and is in NAD. HEENT:  Normocephalic, atraumatic.  The mucous membranes are moist. The superficial temporal arteries are without ropiness or tenderness. CV:  RRR with 3/6 sem Lungs:  CTAB Neck/HEME:  There are no carotid bruits bilaterally.  Neurological examination:  Orientation: The patient is alert and oriented x3.  Cranial nerves: There is good facial symmetry. Extraocular  muscles are intact. The visual fields are full to confrontational testing. The speech is very hypophonic and dysarthric.   Soft palate rises symmetrically and there is no tongue deviation. Hearing is intact to conversational tone. Sensation: Sensation is intact to light and pinprick throughout (facial, trunk, extremities). Vibration is decreased distally. There is no extinction with double simultaneous stimulation. There is no sensory dermatomal level identified. Motor: Strength is 5/5 in the bilateral upper and lower extremities.   Shoulder shrug is equal and symmetric.  There is no pronator drift. Deep tendon reflexes: Deep tendon reflexes are 1/4 at the bilateral biceps, triceps, brachioradialis, patella and achilles. Plantar responses are downgoing bilaterally.  Movement examination: Tone: There is mild to mod increased tone on the R Abnormal movements: there is RUE rest tremor Coordination:  There is  decremation with RAM's, with any form of RAMS, including alternating supination and pronation of the forearm, hand opening and closing, finger taps, heel taps and toe taps on the R.  He has very significant  apraxia when asked to do finger taps on the left.  There is no apraxia otherwise.  He is slow on the left and has decremation on the left as well, but not as significant as the right. Gait and Station: he has camptocormia to the L while sitting in the chair.  He pushes off to arise.  He is flexed at the waist.  He shuffles.  He freezes in the doorway.  He turns en bloc.  He is unstable.   I have reviewed and interpreted the following labs independently   Chemistry      Component Value Date/Time   NA 139 02/24/2022 0950   K 4.4 02/24/2022 0950   CL 102 02/24/2022 0950   CO2 29 02/24/2022 0950   BUN 22 02/24/2022 0950   CREATININE 0.91 02/24/2022 0950      Component Value Date/Time   CALCIUM 8.8 (L) 02/24/2022 0950   ALKPHOS 52 02/23/2022 1545   AST 16 02/23/2022 1545   ALT 15 02/23/2022 1545   BILITOT 0.4 02/23/2022 1545      Lab Results  Component Value Date   TSH 1.018 02/19/2022   Lab Results  Component Value Date   WBC 4.3 02/23/2022   HGB 10.2 (L) 02/23/2022   HCT 31.4 (L) 02/23/2022   MCV 91.3 02/23/2022   PLT 150 02/23/2022     Total time spent on today's visit was 65mnutes, including both face-to-face time and nonface-to-face time.  Time included that spent on review of records (prior notes available to me/labs/imaging if pertinent), discussing treatment and goals, answering patient's questions and coordinating care.  Cc:  JBiagio Borg MD

## 2022-04-23 NOTE — Telephone Encounter (Signed)
Informed patient and aid of referral

## 2022-04-27 ENCOUNTER — Ambulatory Visit (INDEPENDENT_AMBULATORY_CARE_PROVIDER_SITE_OTHER): Payer: Medicare HMO | Admitting: Neurology

## 2022-04-27 ENCOUNTER — Encounter: Payer: Self-pay | Admitting: Neurology

## 2022-04-27 VITALS — BP 124/76 | HR 64 | Ht 71.0 in | Wt 146.2 lb

## 2022-04-27 DIAGNOSIS — G20A1 Parkinson's disease without dyskinesia, without mention of fluctuations: Secondary | ICD-10-CM | POA: Diagnosis not present

## 2022-04-27 DIAGNOSIS — K117 Disturbances of salivary secretion: Secondary | ICD-10-CM

## 2022-04-27 DIAGNOSIS — R482 Apraxia: Secondary | ICD-10-CM

## 2022-04-27 MED ORDER — CARBIDOPA-LEVODOPA 25-100 MG PO TABS: 1.0000 | ORAL_TABLET | Freq: Three times a day (TID) | ORAL | 1 refills | Status: DC

## 2022-04-27 NOTE — Patient Instructions (Addendum)
Constipation and Parkinson's disease:  1.Rancho recipe for constipation in Parkinsons Disease:  -1 cup of unprocessed bran (need to get this at [employer], [employer] or similar type of store), 2 cups of applesauce in 1 cup of prune juice 2.  Increase fiber intake (Metamucil,vegetables) 3.  Regular, moderate exercise can be beneficial. 4.  Avoid medications causing constipation, such as medications like antacids with calcium or magnesium 5.  It's okay to take daily Miralax, and taper if stools become too loose or you experience diarrhea 6.  Stool softeners (Colace) can help with chronic constipation and I recommend you take this daily. 7.  Increase water intake.  You should be drinking 1/2 gallon of water a day as long as you have not been diagnosed with congestive heart failure or renal/kidney failure.  This is probably the single greatest thing that you can do to help your constipation.     Start Carbidopa Levodopa as follows: Take 1/2 tablet three times daily, at least 30 minutes before meals (approximately 7am/11am/4pm), for one week Then take 1/2 tablet in the morning, 1/2 tablet in the afternoon, 1 tablet in the evening, at least 30 minutes before meals, for one week Then take 1/2 tablet in the morning, 1 tablet in the afternoon, 1 tablet in the evening, at least 30 minutes before meals, for one week Then take 1 tablet three times daily at 7am/11am/4pm, at least 30 minutes before meals   As a reminder, carbidopa/levodopa can be taken at the same time as a carbohydrate, but we like to have you take your pill either 30 minutes before a protein source or 1 hour after as protein can interfere with carbidopa/levodopa absorption.  2.  You can discuss botox for drooling and for the eyes and don't have to do either of these or can just do it for the drooling.  3.  Use your walker at all times!  Local and Online Resources for Power over Parkinson's Group  December 2023    LOCAL [place]  PARKINSON'S GROUPS   Power over Parkinson's Group:    Power Over Parkinson's Patient Education Group will be Wednesday, December 13th-*Hybrid meting*- in person at [facility] location and via [facility], 2:00-3:00 pm.   Starting in November, Power over [employer] and Care Partner Groups will meet together, with plans for separate break out session for caregivers (*this will be evolving over the next few months) Upcoming Power over Parkinson's Meetings/Care Partner Support:  2nd Wednesdays of the month at 2 pm:   December 13th, January 10th  [place] at [email or web address] if interested in participating in this group    [place] Party!  Wednesday, December 6th, 4:00-5:00 pm.  [facility] and Fitness.  RSVP to Garnetta Buddy at [phone] or [email or web address] New PWR! Moves Dynegy Instructor-Led Classes offering at [employer]!  TUESDAYS and Wednesdays 1-2 pm.   Contact Vonna Kotyk at  [email or web address]  or [phone] (Tuesday classes are modified for chair and standing only) Dance for Parkinson 's classes will be on Tuesdays 9:30am-10:30am starting October 3-December 12 with a break the week of November 21st. Located in the [employer] , in the first floor of the [employer] ([place] To register:  [email or web address] or [phone]  Drumming for Parkinson's will be held on 2nd and 4th Mondays at 11:00 am.   Located at the [place] ([place]  [place]  [place] at [email or web address] or [phone]  Through support from the [place] for Parkinson's classes are free for both patients and caregivers.    [employer] Parkinson's Tai Chi Class, Mondays at 11 am.  Call [phone] for details   [place]:   [email or web address]  PD Health at Home continues:  Mindfulness Mondays, Wellness Wednesdays, Fitness Fridays   Upcoming Education:    Eating and Feeling Well through the [place]. Wednesday, Dec. 6th,  1-2 pm  Hospital Safety.  Wednesday, Dec. 13th, 1-2 pm Register for expert briefings (webinars) at [email or web address]  Please check out their website to sign up for emails and see their full online offerings      [place]:  [email or web address]   Third Thursday Webinars:  On the third Thursday of every month at 12 p.m. ET, join our free live webinars to learn about various aspects of living with Parkinson's disease and our work to speed medical breakthroughs.  Upcoming Webinar:  Tools for Diagnosing and Visualizing Parkinson's Disease.  Thursday, December 21st at 12 noon. Check out additional information on their website to see their full online offerings    [facility]:  [email or web address]  Upcoming Webinar:   Stay tuned  Webinar Series:  Living with Parkinson's Meetup.   Third Thursdays each month, 3 pm  Care Partner Monthly Meetup.  With Robin Searing Phinney.  First Tuesday of each month, 2 pm  Check out additional information to Live Well Today on their website    Parkinson and Movement Disorders (PMD) Alliance:  www.[email or web address]  [employer]:  Online Education Events  Sign up for emails, which are sent weekly to give you updates on programming and online offerings    Parkinson's Association of the [place]:  www.parkinsonassociation.org  Information on online support groups, education events, and online exercises including Yoga, Parkinson's exercises and more-LOTS of information on links to PD resources and online events  Virtual Support Group through Parkinson's Association of the [place]; next one is scheduled for Wednesday, December 6th  at 2 pm.  (These are  typically scheduled for the 1st Wednesday of the month at 2 pm).  Visit website for details.   MOVEMENT AND EXERCISE OPPORTUNITIES  PWR! Moves Classes at [place].  Wednesdays 10 and 11 am.   Contact Amy Marriott, PT [email or web address] if interested.  NEW PWR! Moves Class offerings at [employer].  *TUESDAYS* and Wednesdays 1-2 pm.    Contact Vonna Kotyk at  [email or web address]    Parkinson's Wellness Recovery (PWR! Moves)  www.[email or web address]  Info on the PWR! Virtual Experience:  You will have access to our expertise?through self-assessment, guided plans that start with the PD-specific fundamentals, educational content, tips, Q&A with an expert, and a growing Art therapist of PD-specific pre-recorded and live exercise classes of varying types and intensity - both physical and cognitive! If that is not enough, we offer 1:1 wellness consultations (in-person or virtual) to personalize your PWR! Research scientist (medical).   [place] Fridays:   As part of the PD Health @ Home program, this free video series focuses each week on one aspect of fitness designed to support people living with Parkinson's.? These weekly videos highlight the [place] fitness guidelines for people with Parkinson's disease.  [email or web address]   Dance for PD website is offering free, live-stream classes throughout the week, as well as links to [employer] of classes:  [email or web address]  Virtual dance and Pilates for Parkinson's classes:  Click on the Community Tab> Parkinson's Movement Initiative Tab.  To register for classes and for more information, visit [email or web address] and click the "community" tab.   [employer] Parkinson's Cycling Classes   [facility] [employer]:  Thursdays @ Noon-Live classes at [employer] ([employer] at [email or web address]?or [phone])  [facility]:  Virtual Classes Mondays and Thursdays Jeanette Caprice classes Tuesday, Wednesday and Thursday (contact [place] at [email or web address] ?or [phone])  [place]  Varied levels of classes are offered Tuesdays and Thursdays at [employer].   Stretching with Verdis Frederickson weekly class is also offered for people with Parkinson's  To observe a class or for more information, call [phone] or email Hezzie Bump at [email or web address]   ADDITIONAL SUPPORT AND RESOURCES  Well[employer]:Online Caregiver Education Opportunities:  www.well-springsolutions.org/caregiver-education/caregiver-support-group.  You may also contact Vickki Muff at [email or web address] or [phone].     Well-Spring Navigator:  Just1Navigator program, a?free service to help individuals and families through the journey of determining care for older adults.  The "Navigator" is a [phone], Education officer, museum, who will speak with a prospective client and/or loved ones to provide an assessment of the situation and a set of recommendations for a personalized care plan -- all free of charge, and whether?Well[employer]Spring [employer] offers the needed service or not. If the need is not a service we provide, we are well-connected with reputable programs in town that we can refer you to.  www.well-springsolutions.org or to speak with the Navigator, call [phone].

## 2022-04-28 ENCOUNTER — Other Ambulatory Visit: Payer: Self-pay

## 2022-04-28 DIAGNOSIS — G20A1 Parkinson's disease without dyskinesia, without mention of fluctuations: Secondary | ICD-10-CM

## 2022-05-13 DIAGNOSIS — K4090 Unilateral inguinal hernia, without obstruction or gangrene, not specified as recurrent: Secondary | ICD-10-CM | POA: Diagnosis not present

## 2022-05-14 DIAGNOSIS — R350 Frequency of micturition: Secondary | ICD-10-CM | POA: Diagnosis not present

## 2022-05-14 DIAGNOSIS — N401 Enlarged prostate with lower urinary tract symptoms: Secondary | ICD-10-CM | POA: Diagnosis not present

## 2022-05-18 ENCOUNTER — Telehealth: Payer: Self-pay | Admitting: Internal Medicine

## 2022-05-18 NOTE — Telephone Encounter (Signed)
Gracee from [place] called for verbal orders for HHPT re-certification 1X for 8 weeks.  Please call Gracee to confirm: [phone]

## 2022-05-19 NOTE — Telephone Encounter (Signed)
Ok for verbals 

## 2022-05-19 NOTE — Telephone Encounter (Signed)
Please advise for verbal orders for HHPT re-certification 1X for 8 weeks.

## 2022-05-19 NOTE — Telephone Encounter (Signed)
Gracee from [facility] given ok for verbal orders from [place].

## 2022-05-26 ENCOUNTER — Ambulatory Visit: Payer: Medicare HMO | Admitting: Neurology

## 2022-07-01 DIAGNOSIS — N401 Enlarged prostate with lower urinary tract symptoms: Secondary | ICD-10-CM | POA: Diagnosis not present

## 2022-07-01 DIAGNOSIS — R350 Frequency of micturition: Secondary | ICD-10-CM | POA: Diagnosis not present

## 2022-07-01 DIAGNOSIS — R351 Nocturia: Secondary | ICD-10-CM | POA: Diagnosis not present

## 2022-07-07 ENCOUNTER — Telehealth: Payer: Self-pay | Admitting: Internal Medicine

## 2022-07-07 DIAGNOSIS — L609 Nail disorder, unspecified: Secondary | ICD-10-CM

## 2022-07-07 DIAGNOSIS — L409 Psoriasis, unspecified: Secondary | ICD-10-CM

## 2022-07-07 NOTE — Telephone Encounter (Signed)
Notified pt with MD response.../lmb 

## 2022-07-07 NOTE — Telephone Encounter (Signed)
Ok this is done 

## 2022-07-07 NOTE — Telephone Encounter (Signed)
Patient would ike to have a referral to a podiatrist - he is having trouble with his toe nails - podiatrist name is Dr. Rosemary Holms at [facility]  They also need a referral to a dermatologist for psoriasis.

## 2022-07-17 DIAGNOSIS — H401133 Primary open-angle glaucoma, bilateral, severe stage: Secondary | ICD-10-CM | POA: Diagnosis not present

## 2022-07-17 DIAGNOSIS — H04123 Dry eye syndrome of bilateral lacrimal glands: Secondary | ICD-10-CM | POA: Diagnosis not present

## 2022-08-04 ENCOUNTER — Ambulatory Visit: Payer: Medicare HMO | Admitting: Internal Medicine

## 2022-08-18 ENCOUNTER — Ambulatory Visit (INDEPENDENT_AMBULATORY_CARE_PROVIDER_SITE_OTHER): Payer: Medicare HMO | Admitting: Internal Medicine

## 2022-08-18 VITALS — BP 126/78 | HR 60 | Temp 98.1°F | Ht 71.0 in | Wt 149.0 lb

## 2022-08-18 DIAGNOSIS — I10 Essential (primary) hypertension: Secondary | ICD-10-CM | POA: Diagnosis not present

## 2022-08-18 DIAGNOSIS — R7989 Other specified abnormal findings of blood chemistry: Secondary | ICD-10-CM | POA: Diagnosis not present

## 2022-08-18 DIAGNOSIS — R739 Hyperglycemia, unspecified: Secondary | ICD-10-CM

## 2022-08-18 DIAGNOSIS — Z0001 Encounter for general adult medical examination with abnormal findings: Secondary | ICD-10-CM

## 2022-08-18 DIAGNOSIS — K5900 Constipation, unspecified: Secondary | ICD-10-CM

## 2022-08-18 DIAGNOSIS — E7800 Pure hypercholesterolemia, unspecified: Secondary | ICD-10-CM | POA: Diagnosis not present

## 2022-08-18 DIAGNOSIS — E538 Deficiency of other specified B group vitamins: Secondary | ICD-10-CM

## 2022-08-18 LAB — CBC WITH DIFFERENTIAL/PLATELET
Basophils Absolute: 0 10*3/uL (ref 0.0–0.1)
Basophils Relative: 1.1 % (ref 0.0–3.0)
Eosinophils Absolute: 0.1 10*3/uL (ref 0.0–0.7)
Eosinophils Relative: 4.2 % (ref 0.0–5.0)
HCT: 37.9 % — ABNORMAL LOW (ref 39.0–52.0)
Hemoglobin: 12.6 g/dL — ABNORMAL LOW (ref 13.0–17.0)
Lymphocytes Relative: 30.7 % (ref 12.0–46.0)
Lymphs Abs: 1 10*3/uL (ref 0.7–4.0)
MCHC: 33.3 g/dL (ref 30.0–36.0)
MCV: 88.2 fl (ref 78.0–100.0)
Monocytes Absolute: 0.4 10*3/uL (ref 0.1–1.0)
Monocytes Relative: 13.9 % — ABNORMAL HIGH (ref 3.0–12.0)
Neutro Abs: 1.6 10*3/uL (ref 1.4–7.7)
Neutrophils Relative %: 50.1 % (ref 43.0–77.0)
Platelets: 202 10*3/uL (ref 150.0–400.0)
RBC: 4.3 Mil/uL (ref 4.22–5.81)
RDW: 13.3 % (ref 11.5–15.5)
WBC: 3.2 10*3/uL — ABNORMAL LOW (ref 4.0–10.5)

## 2022-08-18 LAB — VITAMIN D 25 HYDROXY (VIT D DEFICIENCY, FRACTURES): VITD: 66.11 ng/mL (ref 30.00–100.00)

## 2022-08-18 LAB — HEPATIC FUNCTION PANEL
ALT: 7 U/L (ref 0–53)
AST: 18 U/L (ref 0–37)
Albumin: 4.3 g/dL (ref 3.5–5.2)
Alkaline Phosphatase: 55 U/L (ref 39–117)
Bilirubin, Direct: 0.1 mg/dL (ref 0.0–0.3)
Total Bilirubin: 0.5 mg/dL (ref 0.2–1.2)
Total Protein: 7.1 g/dL (ref 6.0–8.3)

## 2022-08-18 LAB — BASIC METABOLIC PANEL
BUN: 18 mg/dL (ref 6–23)
CO2: 33 mEq/L — ABNORMAL HIGH (ref 19–32)
Calcium: 9.6 mg/dL (ref 8.4–10.5)
Chloride: 102 mEq/L (ref 96–112)
Creatinine, Ser: 1.04 mg/dL (ref 0.40–1.50)
GFR: 66.89 mL/min (ref >60.00)
Glucose, Bld: 83 mg/dL (ref 70–99)
Potassium: 4 mEq/L (ref 3.5–5.1)
Sodium: 140 mEq/L (ref 135–145)

## 2022-08-18 LAB — LIPID PANEL
Cholesterol: 280 mg/dL — ABNORMAL HIGH (ref 0–200)
HDL: 80.4 mg/dL (ref >39.00)
LDL Cholesterol: 172 mg/dL — ABNORMAL HIGH (ref 0–99)
NonHDL: 199.59
Total CHOL/HDL Ratio: 3
Triglycerides: 136 mg/dL (ref 0.0–149.0)
VLDL: 27.2 mg/dL (ref 0.0–40.0)

## 2022-08-18 LAB — VITAMIN B12: Vitamin B-12: 1252 pg/mL — ABNORMAL HIGH (ref 211–911)

## 2022-08-18 LAB — TSH: TSH: 0.55 u[IU]/mL (ref 0.35–5.50)

## 2022-08-18 NOTE — Assessment & Plan Note (Signed)
Mild recent worsening, for colace otc 100 bid prn

## 2022-08-18 NOTE — Patient Instructions (Signed)

## 2022-08-18 NOTE — Progress Notes (Unsigned)
Patient ID: [name] Good, male   DOB: July 26, 1940, 82 y.o.   MRN: [id number]         Chief Complaint:: wellness exam and constipation, htn, hyperglycemia, hld, lo vit d       HPI:  [name] Good is a 82 y.o. male here for wellness exam; for shingrix at pharmacy, o/w up to date                        Also now walking without device.  Now taking flomax qhs per urology.  Also s/p 3 teeth extracted mar without complication.  Pt denies chest pain, increased sob or doe, wheezing, orthopnea, PND, increased LE swelling, palpitations, dizziness or syncope.   Pt denies polydipsia, polyuria, or new focal neuro s/s.    Pt denies fever, wt loss, night sweats, loss of appetite, or other constitutional symptoms   Denies worsening reflux, abd pain, dysphagia, n/v, bowel change or blood except for mild worsening constipation recently with being some less active overall, gained a few lbs.     Wt Readings from Last 3 Encounters:  08/18/22 149 lb (67.6 kg)  04/27/22 146 lb 3.2 oz (66.3 kg)  04/02/22 141 lb (64 kg)   BP Readings from Last 3 Encounters:  08/18/22 126/78  04/27/22 124/76  04/02/22 130/76   Immunization History  Administered Date(s) Administered   Influenza Split 03/03/2011, 02/09/2012   Influenza Whole 04/06/2007, 01/31/2008, 02/26/2009, 03/21/2010   Influenza, High Dose Seasonal PF 02/14/2013, 01/09/2014, 01/12/2017, 01/27/2018   Influenza,inj,Quad PF,6+ Mos 12/10/2015   Influenza-Unspecified 01/05/2015   PFIZER(Purple Top)SARS-COV-2 Vaccination 06/16/2019, 07/12/2019, 02/03/2020   Pneumococcal Conjugate-13 11/29/2013   Pneumococcal Polysaccharide-23 09/02/2009, 03/03/2011   Td 12/19/1997, 02/26/2009   Tdap 05/23/2019   Zoster Recombinat (Shingrix) 08/28/2016   Zoster, Live 11/28/2014   Health Maintenance Due  Topic Date Due   Medicare Annual Wellness (AWV)  09/11/2022      Past Medical History:  Diagnosis Date   Balanitis    hx of   Cataract    Glaucoma    Hx of  adenomatous polyp of colon 03/09/2014   Hyperlipidemia    Hypertension    Osteoarthritis    diffuse   Past Surgical History:  Procedure Laterality Date   CATARACT EXTRACTION     COLONOSCOPY  06-05-2003   hems only   TONSILLECTOMY      reports that he quit smoking about 45 years ago. His smoking use included cigarettes. He has a 20.00 pack-year smoking history. He has never been exposed to tobacco smoke. He has never used smokeless tobacco. He reports that he does not drink alcohol and does not use drugs. family history includes Cancer in his father; Diabetes in his brother; Heart attack in his brother; Other (age of onset: 76) in his mother. Allergies  Allergen Reactions   Lipitor [Atorvastatin] Other (See Comments)    Uneasy feeling Myalgias    Lopid [Gemfibrozil]     Unknown reaction   Penicillins Other (See Comments)    Unknown reaction   Statins Other (See Comments)    Myalgias    Valtrex [Valacyclovir] Other (See Comments)    Unknown reaction   Vitamin B3 [Niacin] Other (See Comments)    Unknown reaction   Current Outpatient Medications on File Prior to Visit  Medication Sig Dispense Refill   Acetaminophen (TYLENOL PO) Take 1-2 tablets by mouth daily as needed (pain, headache, fever).     carbidopa-levodopa (SINEMET IR) 25-100  MG tablet Take 1 tablet by mouth 3 (three) times daily. 8am/noon/4pm 270 tablet 1   dorzolamide-timolol (COSOPT) 22.3-6.8 MG/ML ophthalmic solution Place 1 drop into both eyes 2 (two) times daily.     lactose free nutrition (BOOST PLUS) LIQD Take 237 mLs by mouth 3 (three) times daily with meals.  0   Multiple Vitamins-Minerals (CENTRUM SILVER ADULT 50+) TABS Take 1 tablet by mouth daily.     solifenacin (VESICARE) 5 MG tablet Take 1 tablet (5 mg total) by mouth daily. 90 tablet 3   tamsulosin (FLOMAX) 0.4 MG CAPS capsule Take 0.4 mg by mouth daily.     TURMERIC CURCUMIN PO Take 1 tablet by mouth daily.     No current facility-administered  medications on file prior to visit.        ROS:  All others reviewed and negative.  Objective        PE:  BP 126/78 (BP Location: Left Arm, Patient Position: Sitting, Cuff Size: Normal)   Pulse 60   Temp 98.1 F (36.7 C) (Oral)   Ht 5\' 11"  (1.803 m)   Wt 149 lb (67.6 kg)   SpO2 99%   BMI 20.78 kg/m                 Constitutional: Pt appears in NAD               HENT: Head: NCAT.                Right Ear: External ear normal.                 Left Ear: External ear normal.                Eyes: . Pupils are equal, round, and reactive to light. Conjunctivae and EOM are normal               Nose: without d/c or deformity               Neck: Neck supple. Gross normal ROM               Cardiovascular: Normal rate and regular rhythm.                 Pulmonary/Chest: Effort normal and breath sounds without rales or wheezing.                Abd:  Soft, NT, ND, + BS, no organomegaly               Neurological: Pt is alert. At baseline orientation, motor grossly intact               Skin: Skin is warm. No rashes, no other new lesions, LE edema - none               Psychiatric: Pt behavior is normal without agitation   Micro: none  Cardiac tracings I have personally interpreted today:  none  Pertinent Radiological findings (summarize): none   Lab Results  Component Value Date   WBC 3.2 (L) 08/18/2022   HGB 12.6 (L) 08/18/2022   HCT 37.9 (L) 08/18/2022   PLT 202.0 08/18/2022   GLUCOSE 83 08/18/2022   CHOL 280 (H) 08/18/2022   TRIG 136.0 08/18/2022   HDL 80.40 08/18/2022   LDLDIRECT 123.0 01/12/2017   LDLCALC 172 (H) 08/18/2022   ALT 7 08/18/2022   AST 18 08/18/2022   NA 140 08/18/2022   K 4.0 08/18/2022  CL 102 08/18/2022   CREATININE 1.04 08/18/2022   BUN 18 08/18/2022   CO2 33 (H) 08/18/2022   TSH 0.55 08/18/2022   PSA 3.83 06/03/2021   INR 1.1 (H) 06/03/2021   HGBA1C 5.6 08/18/2022   Assessment/Plan:  [name] Good is a 82 y.o. Black or African American [2]  male with  has a past medical history of Balanitis, Cataract, Glaucoma, adenomatous polyp of colon (03/09/2014), Hyperlipidemia, Hypertension, and Osteoarthritis.  Constipation Mild recent worsening, for colace otc 100 bid prn  Encounter for well adult exam with abnormal findings Age and sex appropriate education and counseling updated with regular exercise and diet Referrals for preventative services - none needed Immunizations addressed -for shingrix at pharmacy Smoking counseling  - none needed Evidence for depression or other mood disorder - none significant Most recent labs reviewed. I have personally reviewed and have noted: 1) the patient's medical and social history 2) The patient's current medications and supplements 3) The patient's height, weight, and BMI have been recorded in the chart   Essential hypertension BP Readings from Last 3 Encounters:  08/18/22 126/78  04/27/22 124/76  04/02/22 130/76   Stable, pt to continue medical treatment  - diet, wt control  Hyperglycemia Lab Results  Component Value Date   HGBA1C 5.6 08/18/2022   Stable, pt to continue current medical treatment  - diet, wt control   Hyperlipidemia Lab Results  Component Value Date   LDLCALC 172 (H) 08/18/2022   Severe uncontrolled,, pt continues to decline statin or zetia or repatha for now   Low vitamin D level Last vitamin D Lab Results  Component Value Date   VD25OH 66.11 08/18/2022   Stable, cont oral replacement  Followup: Return in about 6 months (around 02/17/2023).  Oliver Barre, MD 08/20/2022 6:34 PM [facility]

## 2022-08-19 LAB — URINALYSIS, ROUTINE W REFLEX MICROSCOPIC
Bilirubin Urine: NEGATIVE
Hgb urine dipstick: NEGATIVE
Ketones, ur: NEGATIVE
Leukocytes,Ua: NEGATIVE
Nitrite: NEGATIVE
RBC / HPF: NONE SEEN (ref 0–?)
Specific Gravity, Urine: 1.01 (ref 1.000–1.030)
Total Protein, Urine: NEGATIVE
Urine Glucose: NEGATIVE
Urobilinogen, UA: 0.2 (ref 0.0–1.0)
pH: 6.5 (ref 5.0–8.0)

## 2022-08-19 LAB — HEMOGLOBIN A1C: Hgb A1c MFr Bld: 5.6 % (ref 4.6–6.5)

## 2022-08-20 ENCOUNTER — Encounter: Payer: Self-pay | Admitting: Internal Medicine

## 2022-08-20 NOTE — Assessment & Plan Note (Signed)
BP Readings from Last 3 Encounters:  08/18/22 126/78  04/27/22 124/76  04/02/22 130/76   Stable, pt to continue medical treatment  - diet, wt control

## 2022-08-20 NOTE — Assessment & Plan Note (Signed)
Lab Results  Component Value Date   LDLCALC 172 (H) 08/18/2022   Severe uncontrolled,, pt continues to decline statin or zetia or repatha for now

## 2022-08-20 NOTE — Assessment & Plan Note (Signed)
Last vitamin D Lab Results  Component Value Date   VD25OH 66.11 08/18/2022   Stable, cont oral replacement

## 2022-08-20 NOTE — Assessment & Plan Note (Signed)

## 2022-08-20 NOTE — Assessment & Plan Note (Signed)
Lab Results  Component Value Date   HGBA1C 5.6 08/18/2022   Stable, pt to continue current medical treatment  - diet, wt control

## 2022-08-28 DIAGNOSIS — L409 Psoriasis, unspecified: Secondary | ICD-10-CM | POA: Diagnosis not present

## 2022-09-11 NOTE — Progress Notes (Unsigned)
Assessment/Plan:    Parkinson's disease, likely idiopathic.  Disease was diagnosed in January, 2024, but likely had the disease for many years prior -Patient does have evidence of bilateral basal ganglia infarcts, so vascular parkinsonism cannot be ruled out.  However, clinically, the patient really does not look like he has vascular parkinsonism.             -Increase carbidopa/levodopa 25/100, 1.5 tablets 3 times per day at 8am/noon/4pm.             -add carbidopa/levodopa 50/200 CR at bedtime.  Gets up in the middle of the night x 2 and is stiff getting up.  They will call me and let me know what pharmacy they want this sent to   2.  Sialorrhea             -Discussed Botox again but his drooling is improved post starting levodopa and he wants to hold right now   3.  Eyelid opening apraxia             -discussed this can be associated since Parkinsons Disease              -Discussed Botox.  He notes improvement since starting levodopa   Subjective:   [name] Good was seen today in follow up for Parkinsons disease.  My previous records were reviewed prior to todays visit as well as outside records available to me. Pt with [name] Good (caregiver) who supplements hx.  Pt was newly diagnosed with Parkinsons disease, although we suspected that he had the diagnosis for many years, as disease was quite advanced.  We started him on levodopa last visit.  Today he reports that he reports "it has been doing some good."  He reports he is less shaky.  He is not falling.  We discussed Botox for drooling last visit, but he wanted to think about it. He reports drooling is much better and no longer has to carry a drool cloth.   He is walking on the treadmill for exercise.  He reports that he did do ST with [facility].    Current prescribed movement disorder medications: Carbidopa/levodopa 25/100, 1 tablet 3 times per day (started last visit) - he takes it at 9:30/12:30/3:30    ALLERGIES:    Allergies  Allergen Reactions   Lipitor [Atorvastatin] Other (See Comments)    Uneasy feeling Myalgias    Lopid [Gemfibrozil]     Unknown reaction   Penicillins Other (See Comments)    Unknown reaction   Statins Other (See Comments)    Myalgias    Valtrex [Valacyclovir] Other (See Comments)    Unknown reaction   Vitamin B3 [Niacin] Other (See Comments)    Unknown reaction    CURRENT MEDICATIONS:  Current Meds  Medication Sig   carbidopa-levodopa (SINEMET IR) 25-100 MG tablet Take 1 tablet by mouth 3 (three) times daily. 8am/noon/4pm   dorzolamide-timolol (COSOPT) 22.3-6.8 MG/ML ophthalmic solution Place 1 drop into both eyes 2 (two) times daily.   Multiple Vitamins-Minerals (CENTRUM SILVER ADULT 50+) TABS Take 1 tablet by mouth daily.   tamsulosin (FLOMAX) 0.4 MG CAPS capsule Take 0.4 mg by mouth daily.   TURMERIC CURCUMIN PO Take 1 tablet by mouth daily.     Objective:   PHYSICAL EXAMINATION:    VITALS:   Vitals:   09/15/22 1457  BP: (!) 164/100  Pulse: 61  SpO2: 99%  Weight: 152 lb 3.2 oz (69 kg)  Height: 6' (1.829 m)  GEN:  The patient appears stated age and is in NAD. HEENT:  Normocephalic, atraumatic.  The mucous membranes are moist. The superficial temporal arteries are without ropiness or tenderness. CV:  RRR Lungs:  CTAB Neck/HEME:  There are no carotid bruits bilaterally.  Neurological examination:  Orientation: The patient is alert and oriented x3. Cranial nerves: There is good facial symmetry with facial hypomimia. The speech is fluent and clear. Soft palate rises symmetrically and there is no tongue deviation. Hearing is intact to conversational tone. Sensation: Sensation is intact to light touch throughout Motor: Strength is at least antigravity x4.  Movement examination: Tone: There is mild increased tone in the bilateral UE and mild to possibly mod in the RLE.  This is much better than last visit Abnormal movements: none Coordination:   There is slowness with all RAMs and apraxia but much improved with decremation on both sides.   Gait and Station: no longer with pisa syndrome.  He is slow to push off and arise but looks much better.  He is flexed at the knees instead of the waist.  He shuffles.  No freezing or turning en bloc.   I have reviewed and interpreted the following labs independently    Chemistry      Component Value Date/Time   NA 140 08/18/2022 1441   K 4.0 08/18/2022 1441   CL 102 08/18/2022 1441   CO2 33 (H) 08/18/2022 1441   BUN 18 08/18/2022 1441   CREATININE 1.04 08/18/2022 1441      Component Value Date/Time   CALCIUM 9.6 08/18/2022 1441   ALKPHOS 55 08/18/2022 1441   AST 18 08/18/2022 1441   ALT 7 08/18/2022 1441   BILITOT 0.5 08/18/2022 1441       Lab Results  Component Value Date   WBC 3.2 (L) 08/18/2022   HGB 12.6 (L) 08/18/2022   HCT 37.9 (L) 08/18/2022   MCV 88.2 08/18/2022   PLT 202.0 08/18/2022    Lab Results  Component Value Date   TSH 0.55 08/18/2022     Total time spent on today's visit was 30 minutes, including both face-to-face time and nonface-to-face time.  Time included that spent on review of records (prior notes available to me/labs/imaging if pertinent), discussing treatment and goals, answering patient's questions and coordinating care.  Cc:  [name] Levins, MD

## 2022-09-15 ENCOUNTER — Ambulatory Visit (INDEPENDENT_AMBULATORY_CARE_PROVIDER_SITE_OTHER): Payer: Medicare HMO | Admitting: Neurology

## 2022-09-15 VITALS — BP 136/88 | HR 61 | Ht 72.0 in | Wt 152.2 lb

## 2022-09-15 DIAGNOSIS — G20A2 Parkinson's disease without dyskinesia, with fluctuations: Secondary | ICD-10-CM

## 2022-09-15 MED ORDER — CARBIDOPA-LEVODOPA 25-100 MG PO TABS: 1.5000 | ORAL_TABLET | Freq: Three times a day (TID) | ORAL | 1 refills | Status: DC

## 2022-09-15 MED ORDER — CARBIDOPA-LEVODOPA ER 50-200 MG PO TBCR: 1.0000 | EXTENDED_RELEASE_TABLET | Freq: Every day | ORAL | 1 refills | Status: DC

## 2022-09-15 NOTE — Patient Instructions (Addendum)
Increase carbidopa/levodopa 25/100, 1.5 tablets at 8am/noon/4pm ADD carbidopa/levodopa 50/200 CR at bedtime  Local and Online Resources for Power over Parkinson's Group  June 2024   LOCAL [place] PARKINSON'S GROUPS   Power over Parkinson's Group:    Power Over Parkinson's Patient Education Group will be Wednesday, JULY 10th-*PLEASE NOTE:  We will not be having a June meeting.  Our next meeting will be in July.  We apologize for the inconvenience.   Power over [employer] and Care Partner Groups will meet together, with plans for separate break out session for caregivers, depending on topic/speaker Upcoming Power over Parkinson's Meetings/Care Partner Support:  2nd Wednesdays of the month at 2 pm:   No regular June meeting, July 10th  Contact Amy Marriott at [email or web address] or Lynwood Dawley at [email or web address] if interested in participating in this group    LOCAL EVENTS AND NEW OFFERINGS  PLEASE NOTE:  There will be no June Power over [employer] meeting [facility] June 12th) Picnic Party for [employer].  [facility] Neurology Movement Disorders Program Celebration.  June 19th 1-3 pm.  Contact Lynwood Dawley at [email or web address] Parkinson's Social Game Night.  First Thursday of each month, 2:00-4:00 pm.  *Next date is June 6th*.  Rossie Muskrat [employer].  Contact [email or web address] if interested. Parkinson's CarePartner Group for Men is in the works, if interested email [email or web address] ACT FITNESS Chair Yoga classes "Train and Gain", Fridays 10 am, [employer].  Contact Gina at [phone].  Health visitor Classes offering at [employer]!  TUESDAYS (Chair Yoga)  and Wednesdays (PWR! Moves)  1:00 pm.   Contact Synetta Shadow at  [email or web address]  or [phone]  Antoine Poche! Moves classes.  Thursdays at 11:45, [facility].  Free  to participate through [employer].  Contact Lynwood Dawley at [email or web address] or [phone] to register Drumming for Parkinson's will be held on 2nd and 4th Mondays at 11:00 am.   Located at the [place] of the [employer] ([address] [place].)  Contact Albertina Parr at [email or web address] or [phone]  [facility] Parkinson's Tai Chi Class, Mondays at 11 am.  Call [phone] for details   [facility] EDUCATION AND SUPPORT  Parkinson Foundation:  www.parkinson.org  PD Health at Home continues:  Mindfulness Mondays, Wellness Wednesdays, Fitness Fridays  (PWR! Moves as part of Fitness Fridays March 22nd, 1-1:45 pm) Upcoming Education:   Current and Emerging Methods to Aid Parkinson's Diagnosis.  Wednesday, May 29th, 1-2 pm Recognize and Respond to Parkinson's Psychosis.  Wednesday, June 5th, 1-2 pm Parkinsonisms.  Wednesday, June 12th, 1-2 pm Expert Briefing:  Addressing the Challenge of Apathy in Parkinson's.  Wednesday, September 11th, 1-2 pm. Register for virtual education and Photographer (webinars) at [email or web address] Please check out their website to sign up for emails and see their full online offerings     [employer]:  www.[email or web address]   Third Thursday Webinars:  On the third Thursday of every month at 12 p.m. ET, join our free live webinars to learn about various aspects of living with Parkinson's disease and our work to speed medical breakthroughs.  Upcoming Webinar:  You Want to [employer] for Parkinson's Research: Now What?  Thursday, June 20th at 12 noon. Check out additional information on their website to see their full online offerings    [facility]:  [email or web address]  Upcoming Webinar:  Living Safely with Parkinson's Inside and Outside of your Home.  Thursday, June 13th , 3 pm Series:  Living with Parkinson's Meetup.   Third Thursdays  each month, 3 pm  Care Partner Monthly Meetup.  With Jillene Bucks [place].  First Tuesday of each month, 2 pm  Check out additional information to Live Well Today on their website    Parkinson and Movement Disorders (PMD) Alliance:  www.pmdalliance.org  [employer]:  Online Education Events  Sign up for emails, which are sent weekly to give you updates on programming and online offerings    Parkinson's Association of the [place]:  www.parkinsonassociation.org  Information on online support groups, education events, and online exercises including Yoga, Parkinson's exercises and more-LOTS of information on links to PD resources and online events  Virtual Support Group through Parkinson's Association of the [place]; next one is scheduled for Wednesday, June 5th   MOVEMENT AND EXERCISE OPPORTUNITIES  Parkinson's Exercise Class offerings at [employer]. *TUESDAYS* (Chair yoga) and Wednesdays (PWR! Moves)  1:00 pm.   *Please note that PWR! Moves [place] class has ended.  Please consider trying PWR! Moves on Wednesdays at 1 pm at [facility].  Contact Synetta Shadow at [email or web address]    Parkinson's Wellness Recovery (PWR! Moves)  www.[email or web address]  Info on the PWR! Virtual Experience:  You will have access to our expertise?through self-assessment, guided plans that start with the PD-specific fundamentals, educational content, tips, Q&A with an expert, and a growing Engineering geologist of PD-specific pre-recorded and live exercise classes of varying types and intensity - both physical and cognitive! If that is not enough, we offer 1:1 wellness consultations (in-person or virtual) to personalize your PWR! Dance movement psychotherapist.   Parkinson [employer] Fridays:   As part of the PD Health @ Home program, this free video series focuses each week on one aspect of fitness designed to support people living with Parkinson's.? These weekly videos highlight the Parkinson Foundation fitness  guidelines for people with Parkinson's disease.  [email or web address]  Dance for PD website is offering free, live-stream classes throughout the week, as well as links to [employer] of classes:  [email or web address]  Virtual dance and Pilates for Parkinson's classes: Click on the Community Tab> Parkinson's Movement Initiative Tab.  To register for classes and for more information, visit [email or web address] and click the "community" tab.   [employer] Parkinson's Cycling Classes   [facility]:  Thursdays @ Noon-Live classes at [employer] ([employer] at [email or web address]?or [phone])  [facility]: Classes Tuesday, Wednesday and Thursday (contact [place] at [email or web address] ?or [phone])  [facility] [employer]  Varied levels of classes are offered Tuesdays and Thursdays at [employer].   Stretching with Byrd Hesselbach weekly class is also offered for people with Parkinson's  To observe a class or for more information, call [phone] or email Patricia Nettle at [email or web address]   ADDITIONAL SUPPORT AND RESOURCES  [employer]:  Online Caregiver Education Opportunities:  www.well-springsolutions.org/caregiver-education/caregiver-support-group.  You may also contact Loleta Chance at [facility] -spring.[email or web address] or [phone].     Well-Spring Navigator:  Just1Navigator program, a?free service to help individuals and families through the journey of determining care for older adults.  The "Navigator" is a Child psychotherapist, Sidney Ace, who will speak with a prospective client and/or loved ones to provide an assessment of the situation and a set of recommendations for a personalized care plan -- all free of charge, and whether?Well[employer]Spring [employer] offers the needed service or not. If the need is not a service we provide, we are well-connected with reputable programs  in town that we can refer you to.  www.well-springsolutions.org or to speak with  the Navigator, call [phone].

## 2022-09-16 ENCOUNTER — Ambulatory Visit (INDEPENDENT_AMBULATORY_CARE_PROVIDER_SITE_OTHER): Payer: Medicare HMO

## 2022-09-16 VITALS — Ht 72.0 in | Wt 152.0 lb

## 2022-09-16 DIAGNOSIS — Z0000 Encounter for general adult medical examination without abnormal findings: Secondary | ICD-10-CM | POA: Diagnosis not present

## 2022-09-16 NOTE — Patient Instructions (Addendum)
Mr. [name] Good , Thank you for taking time to come for your Medicare Wellness Visit. I appreciate your ongoing commitment to your health goals. Please review the following plan we discussed and let me know if I can assist you in the future.   These are the goals we discussed:  Goals      Client understands the importance of follow-up with providers by attending scheduled visits        This is a list of the screening recommended for you and due dates:  Health Maintenance  Topic Date Due   Zoster (Shingles) Vaccine (2 of 2) 11/17/2022*   Flu Shot  11/19/2022   Medicare Annual Wellness Visit  09/16/2023   DTaP/Tdap/Td vaccine (4 - Td or Tdap) 05/22/2029   Pneumonia Vaccine  Completed   HPV Vaccine  Aged Out   COVID-19 Vaccine  Discontinued  *Topic was postponed. The date shown is not the original due date.    Advanced directives: Yes  Conditions/risks identified: Yes  Next appointment: Follow up in one year for your annual wellness visit.   Preventive Care 83 Years and Older, Male  Preventive care refers to lifestyle choices and visits with your health care provider that can promote health and wellness. What does preventive care include? A yearly physical exam. This is also called an annual well check. Dental exams once or twice a year. Routine eye exams. Ask your health care provider how often you should have your eyes checked. Personal lifestyle choices, including: Daily care of your teeth and gums. Regular physical activity. Eating a healthy diet. Avoiding tobacco and drug use. Limiting alcohol use. Practicing safe sex. Taking low doses of aspirin every day. Taking vitamin and mineral supplements as recommended by your health care provider. What happens during an annual well check? The services and screenings done by your health care provider during your annual well check will depend on your age, overall health, lifestyle risk factors, and family history of  disease. Counseling  Your health care provider may ask you questions about your: Alcohol use. Tobacco use. Drug use. Emotional well-being. Home and relationship well-being. Sexual activity. Eating habits. History of falls. Memory and ability to understand (cognition). Work and work Astronomer. Screening  You may have the following tests or measurements: Height, weight, and BMI. Blood pressure. Lipid and cholesterol levels. These may be checked every 5 years, or more frequently if you are over 56 years old. Skin check. Lung cancer screening. You may have this screening every year starting at age 15 if you have a 30-pack-year history of smoking and currently smoke or have quit within the past 15 years. Fecal occult blood test (FOBT) of the stool. You may have this test every year starting at age 52. Flexible sigmoidoscopy or colonoscopy. You may have a sigmoidoscopy every 5 years or a colonoscopy every 10 years starting at age 71. Prostate cancer screening. Recommendations will vary depending on your family history and other risks. Hepatitis C blood test. Hepatitis B blood test. Sexually transmitted disease (STD) testing. Diabetes screening. This is done by checking your blood sugar (glucose) after you have not eaten for a while (fasting). You may have this done every 1-3 years. Abdominal aortic aneurysm (AAA) screening. You may need this if you are a current or former smoker. Osteoporosis. You may be screened starting at age 47 if you are at high risk. Talk with your health care provider about your test results, treatment options, and if necessary, the need for more  tests. Vaccines  Your health care provider may recommend certain vaccines, such as: Influenza vaccine. This is recommended every year. Tetanus, diphtheria, and acellular pertussis (Tdap, Td) vaccine. You may need a Td booster every 10 years. Zoster vaccine. You may need this after age 46. Pneumococcal 13-valent  conjugate (PCV13) vaccine. One dose is recommended after age 26. Pneumococcal polysaccharide (PPSV23) vaccine. One dose is recommended after age 89. Talk to your health care provider about which screenings and vaccines you need and how often you need them. This information is not intended to replace advice given to you by your health care provider. Make sure you discuss any questions you have with your health care provider. Document Released: 05/03/2015 Document Revised: 12/25/2015 Document Reviewed: 02/05/2015 Elsevier Interactive Patient Education  2017 [employer].  Fall Prevention in the Home Falls can cause injuries. They can happen to people of all ages. There are many things you can do to make your home safe and to help prevent falls. What can I do on the outside of my home? Regularly fix the edges of walkways and driveways and fix any cracks. Remove anything that might make you trip as you walk through a door, such as a raised step or threshold. Trim any bushes or trees on the path to your home. Use bright outdoor lighting. Clear any walking paths of anything that might make someone trip, such as rocks or tools. Regularly check to see if handrails are loose or broken. Make sure that both sides of any steps have handrails. Any raised decks and porches should have guardrails on the edges. Have any leaves, snow, or ice cleared regularly. Use sand or salt on walking paths during winter. Clean up any spills in your garage right away. This includes oil or grease spills. What can I do in the bathroom? Use night lights. Install grab bars by the toilet and in the tub and shower. Do not use towel bars as grab bars. Use non-skid mats or decals in the tub or shower. If you need to sit down in the shower, use a plastic, non-slip stool. Keep the floor dry. Clean up any water that spills on the floor as soon as it happens. Remove soap buildup in the tub or shower regularly. Attach bath mats  securely with double-sided non-slip rug tape. Do not have throw rugs and other things on the floor that can make you trip. What can I do in the bedroom? Use night lights. Make sure that you have a light by your bed that is easy to reach. Do not use any sheets or blankets that are too big for your bed. They should not hang down onto the floor. Have a firm chair that has side arms. You can use this for support while you get dressed. Do not have throw rugs and other things on the floor that can make you trip. What can I do in the kitchen? Clean up any spills right away. Avoid walking on wet floors. Keep items that you use a lot in easy-to-reach places. If you need to reach something above you, use a strong step stool that has a grab bar. Keep electrical cords out of the way. Do not use floor polish or wax that makes floors slippery. If you must use wax, use non-skid floor wax. Do not have throw rugs and other things on the floor that can make you trip. What can I do with my stairs? Do not leave any items on the stairs. Make sure  that there are handrails on both sides of the stairs and use them. Fix handrails that are broken or loose. Make sure that handrails are as long as the stairways. Check any carpeting to make sure that it is firmly attached to the stairs. Fix any carpet that is loose or worn. Avoid having throw rugs at the top or bottom of the stairs. If you do have throw rugs, attach them to the floor with carpet tape. Make sure that you have a light switch at the top of the stairs and the bottom of the stairs. If you do not have them, ask someone to add them for you. What else can I do to help prevent falls? Wear shoes that: Do not have high heels. Have rubber bottoms. Are comfortable and fit you well. Are closed at the toe. Do not wear sandals. If you use a stepladder: Make sure that it is fully opened. Do not climb a closed stepladder. Make sure that both sides of the stepladder  are locked into place. Ask someone to hold it for you, if possible. Clearly mark and make sure that you can see: Any grab bars or handrails. First and last steps. Where the edge of each step is. Use tools that help you move around (mobility aids) if they are needed. These include: Canes. Walkers. Scooters. Crutches. Turn on the lights when you go into a dark area. Replace any light bulbs as soon as they burn out. Set up your furniture so you have a clear path. Avoid moving your furniture around. If any of your floors are uneven, fix them. If there are any pets around you, be aware of where they are. Review your medicines with your doctor. Some medicines can make you feel dizzy. This can increase your chance of falling. Ask your doctor what other things that you can do to help prevent falls. This information is not intended to replace advice given to you by your health care provider. Make sure you discuss any questions you have with your health care provider. Document Released: 01/31/2009 Document Revised: 09/12/2015 Document Reviewed: 05/11/2014 Elsevier Interactive Patient Education  2017 [employer].

## 2022-09-16 NOTE — Progress Notes (Signed)
I connected with  [name] Good on 09/16/22 by a audio enabled telemedicine application and verified that I am speaking with the correct person using two identifiers. Patient provided verbal permission to speak with caretaker, Elmarie Shiley during this visit.   Patient Location: Home  Provider Location: Office/Clinic  I discussed the limitations of evaluation and management by telemedicine. The patient expressed understanding and agreed to proceed.  Subjective:   [name] Good is a 82 y.o. male who presents for Medicare Annual/Subsequent preventive examination.  Review of Systems     Cardiac Risk Factors include: advanced age (>42men, >84 women);hypertension;dyslipidemia;family history of premature cardiovascular disease;male gender;sedentary lifestyle     Objective:    Today's Vitals   09/16/22 1604  Weight: 152 lb (68.9 kg)  Height: 6' (1.829 m)  PainSc: 0-No pain   Body mass index is 20.61 kg/m.     09/16/2022    4:07 PM 09/15/2022    2:57 PM 04/27/2022    2:36 PM 02/17/2022   10:30 PM 05/19/2017    9:35 PM 01/12/2017    1:39 PM 11/28/2014   10:41 AM  Advanced Directives  Does Patient Have a Medical Advance Directive? Yes Yes Yes No No No Yes  Type of Estate agent of [place];Living will Living will Living will      Copy of Healthcare Power of Attorney in Chart? No - copy requested      Yes  Would patient like information on creating a medical advance directive?    No - Patient declined  Yes (ED - Information included in AVS)     Current Medications (verified) Outpatient Encounter Medications as of 09/16/2022  Medication Sig   Acetaminophen (TYLENOL PO) Take 1-2 tablets by mouth daily as needed (pain, headache, fever).   carbidopa-levodopa (SINEMET CR) 50-200 MG tablet Take 1 tablet by mouth at bedtime.   carbidopa-levodopa (SINEMET IR) 25-100 MG tablet Take 1.5 tablets by mouth 3 (three) times daily. 8am/noon/4pm   dorzolamide-timolol  (COSOPT) 22.3-6.8 MG/ML ophthalmic solution Place 1 drop into both eyes 2 (two) times daily.   lactose free nutrition (BOOST PLUS) LIQD Take 237 mLs by mouth 3 (three) times daily with meals.   Multiple Vitamins-Minerals (CENTRUM SILVER ADULT 50+) TABS Take 1 tablet by mouth daily.   solifenacin (VESICARE) 5 MG tablet Take 1 tablet (5 mg total) by mouth daily.   tamsulosin (FLOMAX) 0.4 MG CAPS capsule Take 0.4 mg by mouth daily.   TURMERIC CURCUMIN PO Take 1 tablet by mouth daily.   No facility-administered encounter medications on file as of 09/16/2022.    Allergies (verified) Lipitor [atorvastatin], Lopid [gemfibrozil], Penicillins, Statins, Valtrex [valacyclovir], and Vitamin b3 [niacin]   History: Past Medical History:  Diagnosis Date   Balanitis    hx of   Cataract    Glaucoma    Hx of adenomatous polyp of colon 03/09/2014   Hyperlipidemia    Hypertension    Osteoarthritis    diffuse   Past Surgical History:  Procedure Laterality Date   CATARACT EXTRACTION     COLONOSCOPY  06-05-2003   hems only   TONSILLECTOMY     Family History  Problem Relation Age of Onset   Other Mother 61       CVA   Cancer Father        lung   Diabetes Brother    Heart attack Brother    Coronary artery disease Neg Hx    Colon cancer Neg Hx  Social History   Socioeconomic History   Marital status: Widowed    Spouse name: Not on file   Number of children: 0   Years of education: 56   Highest education level: Bachelor's degree (e.g., BA, AB, BS)  Occupational History   Occupation: Engineer, materials: [employer]  Tobacco Use   Smoking status: Former    Packs/day: 1.00    Years: 20.00    Additional pack years: 0.00    Total pack years: 20.00    Types: Cigarettes    Quit date: 04/20/1977    Years since quitting: 45.4    Passive exposure: Never   Smokeless tobacco: Never  Vaping Use   Vaping Use: Never used  Substance and Sexual Activity   Alcohol use: No     Alcohol/week: 0.0 standard drinks of alcohol   Drug use: No   Sexual activity: Yes    Partners: Female  Other Topics Concern   Not on file  Social History Narrative   HSG, [employer], [place]. Married '62.    No children. Work - Engineer, production. Lives with wife - iADLs. End of Life Care - provided packet (July '12)      Minister; Still pastor   Right handed    Social Determinants of Health   Financial Resource Strain: Low Risk  (09/16/2022)   Overall Financial Resource Strain (CARDIA)    Difficulty of Paying Living Expenses: Not very hard  Food Insecurity: No Food Insecurity (09/16/2022)   Hunger Vital Sign    Worried About Running Out of Food in the Last Year: Never true    Ran Out of Food in the Last Year: Never true  Transportation Needs: No Transportation Needs (09/16/2022)   PRAPARE - Administrator, Civil Service (Medical): No    Lack of Transportation (Non-Medical): No  Physical Activity: Insufficiently Active (09/16/2022)   Exercise Vital Sign    Days of Exercise per Week: 4 days    Minutes of Exercise per Session: 20 min  Stress: No Stress Concern Present (09/16/2022)   [employer] of Occupational Health - Occupational Stress Questionnaire    Feeling of Stress : Not at all  Social Connections: Moderately Integrated (09/16/2022)   Social Connection and Isolation Panel [NHANES]    Frequency of Communication with Friends and Family: Three times a week    Frequency of Social Gatherings with Friends and Family: Once a week    Attends Religious Services: More than 4 times per year    Active Member of [employer] or Organizations: Yes    Attends Banker Meetings: Not on file    Marital Status: Widowed    Tobacco Counseling Counseling given: Not Answered   Clinical Intake:  Pre-visit preparation completed: Yes  Pain : No/denies pain Pain Score: 0-No pain     BMI - recorded: 20.61 Nutritional Status: BMI of 19-24  Normal Nutritional  Risks: None Diabetes: No  How often do you need to have someone help you when you read instructions, pamphlets, or other written materials from your doctor or pharmacy?: 1 - Never What is the last grade level you completed in school?: Bachelor's Degree from [employer]  Diabetic? No  Interpreter Needed?: No  Information entered by :: Nereida Schepp N. Kyren Vaux, LPN.   Activities of Daily Living    09/16/2022    4:09 PM 02/18/2022    8:00 PM  In your present state of health, do you have any difficulty performing  the following activities:  Hearing? 0   Vision? 0   Difficulty concentrating or making decisions? 0   Walking or climbing stairs? 0   Dressing or bathing? 1   Doing errands, shopping? 1 1  Preparing Food and eating ? Y   Using the Toilet? N   In the past six months, have you accidently leaked urine? Y   Do you have problems with loss of bowel control? Y   Managing your Medications? Y   Managing your Finances? Y   Comment Sister in charge   Housekeeping or managing your Housekeeping? Y     Patient Care Team: Corwin Levins, MD as PCP - General (Internal Medicine) Augustin Schooling, MD as Consulting Physician (Ophthalmology)  Indicate any recent Medical Services you may have received from other than [facility] providers in the past year (date may be approximate).     Assessment:   This is a routine wellness examination for Jacquise.  Hearing/Vision screen Hearing Screening - Comments:: Patient has hearing loss; no hearing aids.  Vision Screening - Comments:: Wears rx glasses - up to date with routine eye exams with Fredderick Phenix, MD.   Dietary issues and exercise activities discussed: Current Exercise Habits: Home exercise routine, Type of exercise: walking;Other - see comments (treadmill), Time (Minutes): 20, Frequency (Times/Week): 4, Weekly Exercise (Minutes/Week): 80, Intensity: Moderate, Exercise limited by: orthopedic condition(s)   Goals Addressed             This  Visit's Progress    Client understands the importance of follow-up with providers by attending scheduled visits        Depression Screen    09/16/2022    4:08 PM 08/18/2022    1:26 PM 04/02/2022    9:56 AM 09/10/2021    1:35 PM 09/01/2021    3:43 PM 06/03/2021    1:24 PM 09/20/2020    3:05 PM  PHQ 2/9 Scores  PHQ - 2 Score 0 0 0 0 6 0 2  PHQ- 9 Score 0  0 0 16      Fall Risk    09/16/2022    4:08 PM 09/15/2022    2:57 PM 08/18/2022    1:26 PM 04/27/2022    2:35 PM 04/02/2022    9:56 AM  Fall Risk   Falls in the past year? 0 0 0 1 0  Number falls in past yr: 0 0 0 0   Injury with Fall? 0 0 0 1 0  Risk for fall due to : No Fall Risks  No Fall Risks  No Fall Risks  Follow up Falls prevention discussed Falls evaluation completed Falls evaluation completed Falls evaluation completed Falls evaluation completed    FALL RISK PREVENTION PERTAINING TO THE HOME:  Any stairs in or around the home? No  If so, are there any without handrails? No  Home free of loose throw rugs in walkways, pet beds, electrical cords, etc? Yes  Adequate lighting in your home to reduce risk of falls? Yes   ASSISTIVE DEVICES UTILIZED TO PREVENT FALLS:  Life alert? No  Use of a cane, walker or w/c? No  Grab bars in the bathroom? No  Shower chair or bench in shower? Yes  Elevated toilet seat or a handicapped toilet? Yes   TIMED UP AND GO:  Was the test performed? No . Telephonic Visit  Cognitive Function:    09/16/2022    4:31 PM 01/12/2017    1:54 PM 11/28/2014   10:47  AM  MMSE - Mini Mental State Exam  Not completed: Unable to complete Unable to complete Unable to complete        09/10/2021    1:38 PM  6CIT Screen  What Year? 0 points  What month? 0 points  What time? 3 points  Count back from 20 0 points  Months in reverse 0 points  Repeat phrase 10 points  Total Score 13 points    Immunizations Immunization History  Administered Date(s) Administered   Influenza Split 03/03/2011,  02/09/2012   Influenza Whole 04/06/2007, 01/31/2008, 02/26/2009, 03/21/2010   Influenza, High Dose Seasonal PF 02/14/2013, 01/09/2014, 01/12/2017, 01/27/2018   Influenza,inj,Quad PF,6+ Mos 12/10/2015   Influenza-Unspecified 01/05/2015   PFIZER(Purple Top)SARS-COV-2 Vaccination 06/16/2019, 07/12/2019, 02/03/2020   Pneumococcal Conjugate-13 11/29/2013   Pneumococcal Polysaccharide-23 09/02/2009, 03/03/2011   Td 12/19/1997, 02/26/2009   Tdap 05/23/2019   Zoster Recombinat (Shingrix) 08/28/2016   Zoster, Live 11/28/2014    TDAP status: Up to date  Flu Vaccine status: Declined, Education has been provided regarding the importance of this vaccine but patient still declined. Advised may receive this vaccine at local pharmacy or Health Dept. Aware to provide a copy of the vaccination record if obtained from local pharmacy or Health Dept. Verbalized acceptance and understanding.  Pneumococcal vaccine status: Up to date  Covid-19 vaccine status: Completed vaccines  Qualifies for Shingles Vaccine? Yes   Zostavax completed Yes   Shingrix Completed?: Yes  Screening Tests Health Maintenance  Topic Date Due   Zoster Vaccines- Shingrix (2 of 2) 11/17/2022 (Originally 10/23/2016)   INFLUENZA VACCINE  11/19/2022   Medicare Annual Wellness (AWV)  09/16/2023   DTaP/Tdap/Td (4 - Td or Tdap) 05/22/2029   Pneumonia Vaccine 33+ Years old  Completed   HPV VACCINES  Aged Out   COVID-19 Vaccine  Discontinued    Health Maintenance  There are no preventive care reminders to display for this patient.   Colorectal cancer screening: No longer required.   Lung Cancer Screening: (Low Dose CT Chest recommended if Age 62-80 years, 30 pack-year currently smoking OR have quit w/in 15years.) does not qualify.   Lung Cancer Screening Referral: no  Additional Screening:  Hepatitis C Screening: does not qualify; Completed: no  Vision Screening: Recommended annual ophthalmology exams for early detection of  glaucoma and other disorders of the eye. Is the patient up to date with their annual eye exam?  Yes  Who is the provider or what is the name of the office in which the patient attends annual eye exams? Fredderick Phenix, MD. If pt is not established with a provider, would they like to be referred to a provider to establish care? No .   Dental Screening: Recommended annual dental exams for proper oral hygiene  Community Resource Referral / Chronic Care Management: CRR required this visit?  No   CCM required this visit?  No      Plan:     I have personally reviewed and noted the following in the patient's chart:   Medical and social history Use of alcohol, tobacco or illicit drugs  Current medications and supplements including opioid prescriptions. Patient is not currently taking opioid prescriptions. Functional ability and status Nutritional status Physical activity Advanced directives List of other physicians Hospitalizations, surgeries, and ER visits in previous 12 months Vitals Screenings to include cognitive, depression, and falls Referrals and appointments  In addition, I have reviewed and discussed with patient certain preventive protocols, quality metrics, and best practice recommendations. A written personalized  care plan for preventive services as well as general preventive health recommendations were provided to patient.     Mickeal Needy, LPN   01/02/7828   Nurse Notes: Patient was unable to complete visit due to severe hearing loss.  Patient provided permission for his caretaker to complete visit for him.  Not able to perform 6CIT during this visit.

## 2022-09-23 ENCOUNTER — Other Ambulatory Visit: Payer: Self-pay

## 2022-09-23 MED ORDER — CARBIDOPA-LEVODOPA ER 50-200 MG PO TBCR: 1.0000 | EXTENDED_RELEASE_TABLET | Freq: Every day | ORAL | 1 refills | Status: DC

## 2022-10-14 ENCOUNTER — Other Ambulatory Visit: Payer: Self-pay

## 2022-10-14 ENCOUNTER — Telehealth: Payer: Self-pay | Admitting: Neurology

## 2022-10-14 DIAGNOSIS — G20A2 Parkinson's disease without dyskinesia, with fluctuations: Secondary | ICD-10-CM

## 2022-10-14 MED ORDER — CARBIDOPA-LEVODOPA 25-100 MG PO TABS: 1.5000 | ORAL_TABLET | Freq: Three times a day (TID) | ORAL | 0 refills | Status: DC

## 2022-10-14 NOTE — Telephone Encounter (Signed)
Called patient sent refill

## 2022-10-14 NOTE — Telephone Encounter (Signed)
Left message with the after hour service on 10-13-22 at 12:27 pm  ( we were not able to get faxes yesterday the system was down )  Caller states that patient needs a refill on the medication Carbidopa levodopa

## 2022-11-04 DIAGNOSIS — N401 Enlarged prostate with lower urinary tract symptoms: Secondary | ICD-10-CM | POA: Diagnosis not present

## 2022-11-04 DIAGNOSIS — R351 Nocturia: Secondary | ICD-10-CM | POA: Diagnosis not present

## 2022-11-04 DIAGNOSIS — R350 Frequency of micturition: Secondary | ICD-10-CM | POA: Diagnosis not present

## 2022-11-10 ENCOUNTER — Encounter: Payer: Self-pay | Admitting: Internal Medicine

## 2022-11-10 ENCOUNTER — Telehealth (INDEPENDENT_AMBULATORY_CARE_PROVIDER_SITE_OTHER): Payer: Medicare HMO | Admitting: Internal Medicine

## 2022-11-10 DIAGNOSIS — U071 COVID-19: Secondary | ICD-10-CM | POA: Diagnosis not present

## 2022-11-10 MED ORDER — NIRMATRELVIR/RITONAVIR (PAXLOVID)TABLET: 3.0000 | ORAL_TABLET | Freq: Two times a day (BID) | ORAL | 0 refills | Status: AC

## 2022-11-10 MED ORDER — BENZONATATE 200 MG PO CAPS: 200.0000 mg | ORAL_CAPSULE | Freq: Three times a day (TID) | ORAL | 0 refills | Status: DC | PRN

## 2022-11-10 NOTE — Progress Notes (Signed)
Virtual Visit via Video Note  I connected with [name] Good on 11/10/22 at  3:20 PM EDT by a video enabled telemedicine application and verified that I am speaking with the correct person using two identifiers.  The patient and the provider were at separate locations throughout the entire encounter. Patient location: home, Provider location: work   I discussed the limitations of evaluation and management by telemedicine and the availability of in person appointments. The patient expressed understanding and agreed to proceed. The patient and the provider were the only parties present for the visit unless noted in HPI below.  History of Present Illness: The patient is a 82 y.o. man with visit for covid-19 positive. Started Sunday night with symptoms including cough and mild cold symptoms.   Observations/Objective: Appearance: normal, slow speech, breathing appears normal  Assessment and Plan: See problem oriented charting  Follow Up Instructions: rx paxlovid and tessalon perles  I discussed the assessment and treatment plan with the patient. The patient was provided an opportunity to ask questions and all were answered. The patient agreed with the plan and demonstrated an understanding of the instructions.   The patient was advised to call back or seek an in-person evaluation if the symptoms worsen or if the condition fails to improve as anticipated.  Myrlene Broker, MD

## 2022-11-10 NOTE — Assessment & Plan Note (Signed)
Symptom onset 3 days ago and in window for paxlovid. Rx paxlovid (GFR 66) and tessalon perles. Call or return for worsening or lack of improvement.

## 2023-01-08 ENCOUNTER — Other Ambulatory Visit: Payer: Self-pay | Admitting: Neurology

## 2023-01-08 DIAGNOSIS — G20A2 Parkinson's disease without dyskinesia, with fluctuations: Secondary | ICD-10-CM

## 2023-01-27 ENCOUNTER — Emergency Department (HOSPITAL_COMMUNITY): Payer: Medicare HMO

## 2023-01-27 ENCOUNTER — Emergency Department (HOSPITAL_COMMUNITY)
Admission: EM | Admit: 2023-01-27 | Discharge: 2023-01-27 | Disposition: A | Discharge status: Home / Self Care | Payer: Medicare HMO | Attending: Emergency Medicine | Admitting: Emergency Medicine

## 2023-01-27 DIAGNOSIS — S0003XA Contusion of scalp, initial encounter: Secondary | ICD-10-CM | POA: Diagnosis not present

## 2023-01-27 DIAGNOSIS — W19XXXA Unspecified fall, initial encounter: Secondary | ICD-10-CM

## 2023-01-27 DIAGNOSIS — S0083XA Contusion of other part of head, initial encounter: Secondary | ICD-10-CM | POA: Insufficient documentation

## 2023-01-27 DIAGNOSIS — M25532 Pain in left wrist: Secondary | ICD-10-CM | POA: Insufficient documentation

## 2023-01-27 DIAGNOSIS — M778 Other enthesopathies, not elsewhere classified: Secondary | ICD-10-CM | POA: Diagnosis not present

## 2023-01-27 DIAGNOSIS — S129XXA Fracture of neck, unspecified, initial encounter: Secondary | ICD-10-CM

## 2023-01-27 DIAGNOSIS — I6782 Cerebral ischemia: Secondary | ICD-10-CM | POA: Diagnosis not present

## 2023-01-27 DIAGNOSIS — S0990XA Unspecified injury of head, initial encounter: Secondary | ICD-10-CM | POA: Diagnosis present

## 2023-01-27 DIAGNOSIS — W07XXXA Fall from chair, initial encounter: Secondary | ICD-10-CM | POA: Insufficient documentation

## 2023-01-27 DIAGNOSIS — M1812 Unilateral primary osteoarthritis of first carpometacarpal joint, left hand: Secondary | ICD-10-CM | POA: Diagnosis not present

## 2023-01-27 DIAGNOSIS — G20A1 Parkinson's disease without dyskinesia, without mention of fluctuations: Secondary | ICD-10-CM | POA: Insufficient documentation

## 2023-01-27 DIAGNOSIS — I10 Essential (primary) hypertension: Secondary | ICD-10-CM | POA: Diagnosis not present

## 2023-01-27 DIAGNOSIS — S12400A Unspecified displaced fracture of fifth cervical vertebra, initial encounter for closed fracture: Secondary | ICD-10-CM | POA: Diagnosis not present

## 2023-01-27 DIAGNOSIS — M25539 Pain in unspecified wrist: Secondary | ICD-10-CM | POA: Diagnosis not present

## 2023-01-27 NOTE — ED Provider Notes (Addendum)
[place] EMERGENCY DEPARTMENT AT [facility] Provider Note   CSN: [id number] Arrival date & time: 01/27/23  1111     History  Chief Complaint  Patient presents with   Fall   Wrist Pain    [name] Good is a 82 y.o. male who presents emergency department after mechanical fall.  He has a past medical history of Parkinson's.  He states that he got tripped up by chair in his hallway and fell forward.  He has pain in the left wrist and hit his head and has an obvious hematoma to the right forehead.  He does not take any blood thinners.  He denies loss of consciousness.  Family states he has not been confused, lethargic or vomiting.  The history is provided by the patient and a relative.  Fall  Wrist Pain       Home Medications Prior to Admission medications   Medication Sig Start Date End Date Taking? Authorizing Provider  Acetaminophen (TYLENOL PO) Take 1-2 tablets by mouth daily as needed (pain, headache, fever).    [provider]  benzonatate (TESSALON) 200 MG capsule Take 1 capsule (200 mg total) by mouth 3 (three) times daily as needed. 11/10/22   Myrlene Broker, MD  carbidopa-levodopa (SINEMET CR) 50-200 MG tablet Take 1 tablet by mouth at bedtime. 09/23/22   Tat, Octaviano Batty, DO  carbidopa-levodopa (SINEMET IR) 25-100 MG tablet TAKE 1 1/2 TABLET BY MOUTH THREE TIMES DAILY. 8AM/ NOON/ 4PM 01/08/23   Tat, Octaviano Batty, DO  dorzolamide-timolol (COSOPT) 22.3-6.8 MG/ML ophthalmic solution Place 1 drop into both eyes 2 (two) times daily. 05/30/19   [provider]  lactose free nutrition (BOOST PLUS) LIQD Take 237 mLs by mouth 3 (three) times daily with meals. 02/20/22   Lanae Boast, MD  Multiple Vitamins-Minerals (CENTRUM SILVER ADULT 50+) TABS Take 1 tablet by mouth daily.    [provider]  solifenacin (VESICARE) 5 MG tablet Take 1 tablet (5 mg total) by mouth daily. 01/21/22   Corwin Levins, MD  tamsulosin (FLOMAX) 0.4 MG CAPS capsule  Take 0.4 mg by mouth daily. 05/21/22   [provider]  TURMERIC CURCUMIN PO Take 1 tablet by mouth daily.    [provider]      Allergies    Lipitor [atorvastatin], Lopid [gemfibrozil], Penicillins, Statins, Valtrex [valacyclovir], and Vitamin b3 [niacin]    Review of Systems   Review of Systems  Physical Exam Updated Vital Signs BP (!) 181/104 (BP Location: Right Arm)   Pulse 65   Temp (!) 96.5 F (35.8 C) (Oral)   Resp 18   SpO2 100%  Physical Exam Vitals and nursing note reviewed.  Constitutional:      General: He is not in acute distress.    Appearance: He is well-developed. He is not diaphoretic.  HENT:     Head: Normocephalic.     Comments: Hematoma to the left temporal region Eyes:     General: No scleral icterus.    Conjunctiva/sclera: Conjunctivae normal.  Neck:     Comments: C-collar in place Cardiovascular:     Rate and Rhythm: Normal rate and regular rhythm.     Heart sounds: Normal heart sounds.  Pulmonary:     Effort: Pulmonary effort is normal. No respiratory distress.     Breath sounds: Normal breath sounds.  Abdominal:     Palpations: Abdomen is soft.     Tenderness: There is no abdominal tenderness.  Musculoskeletal:  Comments: Full range of motion of the left wrist with normal strength, normal sensation and pulses.  No significant deformity or tenderness.  Skin:    General: Skin is warm and dry.  Neurological:     Mental Status: He is alert.  Psychiatric:        Behavior: Behavior normal.     ED Results / Procedures / Treatments   Labs (all labs ordered are listed, but only abnormal results are displayed) Labs Reviewed - No data to display  EKG None  Radiology DG Wrist Complete Left  Result Date: 01/27/2023 CLINICAL DATA:  Fall.  Left wrist soreness.  History of Parkinson's. EXAM: LEFT WRIST - COMPLETE 3+ VIEW COMPARISON:  None Available. FINDINGS: There is diffuse decreased bone mineralization. Neutral ulnar  variance. Mild degenerative spurring at the distal radial styloid. Moderate degenerative tearing at the adjacent distal lateral aspect of the scaphoid. Moderate to severe triscaphe joint space narrowing. Mild thumb carpometacarpal joint space narrowing, subchondral sclerosis/cystic change, and peripheral osteophytosis. No definite acute fracture is seen; there is some decreased bone mineralization at the base of the ulnar styloid, however this does not appear to represent a sharp acute fracture line. Recommend clinical correlation for point tenderness at the distal medial aspect of the ulna. IMPRESSION: 1. No definite acute fracture is seen. There is some decreased bone mineralization at the base of the ulnar styloid, however this does not appear to represent a sharp acute fracture line. It may be secondary to baseline low bone mineralization. Recommend clinical correlation for point tenderness at the distal medial aspect of the ulna. 2. Moderate to severe triscaphe and mild thumb carpometacarpal osteoarthritis. Electronically Signed   By: Neita Garnet M.D.   On: 01/27/2023 12:11    Procedures Procedures    Medications Ordered in ED Medications - No data to display  ED Course/ Medical Decision Making/ A&P Clinical Course as of 01/28/23 0947  Wed Jan 27, 2023  1352 DG Wrist Complete Left Visualized and interpreted left wrist x-ray which shows no acute findings [AH]  1641 Case discussed with Dr. Franky Macho.  We reviewed the images.  He does not think that there is any acute bleeding on CT imaging and does not feel that he needs a repeat scan.  He also feels that his neck is autofused and he does not have to wear collar.  Patient does have some pain with movement toward the left.  Daughter is comfortable with discharge and observation for any worsening in mental status and will buy him an over-the-counter cervical foam collar if he needs it. [AH]  1647 CT Head Wo Contrast [AH]  1647 CT Cervical Spine Wo  Contrast [AH]  1647 DG Wrist Complete Left [AH]    Clinical Course User Index [AH] Arthor Captain, PA-C                                 Medical Decision Making Patient with mechanical fall. MDM and case discussions per ed course. Discussed home monitoring and return precautions with daughter at bedside.   Amount and/or Complexity of Data Reviewed Radiology: ordered. Decision-making details documented in ED Course.          Final Clinical Impression(s) / ED Diagnoses Final diagnoses:  None    Rx / DC Orders ED Discharge Orders     None         Arthor Captain, PA-C 01/28/23 1610  Arthor Captain, PA-C 01/28/23 1610    Bethann Berkshire, MD 01/30/23 1236

## 2023-01-27 NOTE — ED Triage Notes (Signed)
[place] caregiver. Pt fell face forward, has large knot on right forehead. Reports left wrist soreness. Hx parkinsons. C collar in place  188/110 HR 72 CBG 113

## 2023-01-27 NOTE — Discharge Instructions (Signed)
Use a soft neck brace for comfort. Contact a health care provider if: You have irritation or sores on your skin from your brace or cervical collar. You have neck pain that gets worse. You have any of the following problems in your arms or legs or both: Numbness. Weakness. Burning pain. Movement problems. You are unable to control when you pee or poop (incontinence). You have problems with coordination or difficulty walking. Get help right away if: You have swelling in your neck. You have trouble swallowing or breathing. These symptoms may be an emergency. Get help right away. Call 911. Do not wait to see if the symptoms will go away. Do not drive yourself to the hospital. Get help right away if: You have sudden: Severe headache. Severe vomiting. Unequal pupil size. One is bigger than the other. Vision problems. Confusion or irritability. You have a seizure. Your symptoms get worse. You have clear or bloody fluid coming from your nose or ears. These symptoms may be an emergency. Get help right away. Call 911.

## 2023-01-27 NOTE — Progress Notes (Signed)
Patient ID: [name] Good, male   DOB: 11/13/1940, 82 y.o.   MRN: [id number] BP (!) 197/109 (BP Location: Left Arm)   Pulse (!) 58   Temp 98.7 F (37.1 C) (Oral)   Resp 16   SpO2 100% ' CT head and Cspine reviewed. Do not agree with reading of chronic subdural hematoma, could be hygroma. Certainly has nothing to do with today's fall.  Cspine fracture is minimal, no rigid collar is necessary. Can follow up with me in three weeks.

## 2023-01-29 ENCOUNTER — Encounter: Payer: Self-pay | Admitting: Internal Medicine

## 2023-01-29 ENCOUNTER — Ambulatory Visit (INDEPENDENT_AMBULATORY_CARE_PROVIDER_SITE_OTHER): Payer: Medicare HMO | Admitting: Internal Medicine

## 2023-01-29 VITALS — BP 122/74 | HR 60 | Temp 98.4°F | Ht 72.0 in | Wt 158.0 lb

## 2023-01-29 DIAGNOSIS — E7800 Pure hypercholesterolemia, unspecified: Secondary | ICD-10-CM | POA: Diagnosis not present

## 2023-01-29 DIAGNOSIS — Z23 Encounter for immunization: Secondary | ICD-10-CM | POA: Diagnosis not present

## 2023-01-29 DIAGNOSIS — I10 Essential (primary) hypertension: Secondary | ICD-10-CM

## 2023-01-29 DIAGNOSIS — L609 Nail disorder, unspecified: Secondary | ICD-10-CM

## 2023-01-29 DIAGNOSIS — R739 Hyperglycemia, unspecified: Secondary | ICD-10-CM | POA: Diagnosis not present

## 2023-01-29 DIAGNOSIS — S065XAA Traumatic subdural hemorrhage with loss of consciousness status unknown, initial encounter: Secondary | ICD-10-CM | POA: Diagnosis not present

## 2023-01-29 DIAGNOSIS — R7989 Other specified abnormal findings of blood chemistry: Secondary | ICD-10-CM

## 2023-01-29 MED ORDER — REPATHA SURECLICK 140 MG/ML ~~LOC~~ SOAJ: 140.0000 mg | SUBCUTANEOUS | 3 refills | Status: DC

## 2023-01-29 NOTE — Patient Instructions (Addendum)
You had the flu shot today  Please take all new medication as prescribed - the repatha shots for cholesterol  Please continue all other medications as before, and refills have been done if requested.  Please have the pharmacy call with any other refills you may need.  Please continue your efforts at being more active, low cholesterol diet, and weight control.  Please keep your appointments with your specialists as you may have planned  You will be contacted regarding the referral for: podiatry, and Neurosurgury  No further lab work needed today  Please make an Appointment to return in 3 months, or sooner if needed

## 2023-01-29 NOTE — Progress Notes (Signed)
Patient ID: [name], male   DOB: 04/23/1940, 82 y.o.   MRN: [id number]        Chief Complaint: follow up possible SDH, left wrist injury, c spine fracture post fall, nail disorder, hld, htn       HPI:  [name] Good is a 82 y.o. male here after recent unfortunate fall with c spine fx now with soft collar and asked to f/u with Dr Franky Macho NS at 3 wks.  Pt still needs podiatry f/u as for some reason states referral last visit never happened.  Left wrist pain much improved.  Pt willing to start repatha .  Pt denies chest pain, increased sob or doe, wheezing, orthopnea, PND, increased LE swelling, palpitations, dizziness or syncope.   Pt denies polydipsia, polyuria, or new focal neuro s/s.    Pt denies fever, wt loss, night sweats, loss of appetite, or other constitutional symptoms  For flu shot today. Wt Readings from Last 3 Encounters:  01/29/23 158 lb (71.7 kg)  09/16/22 152 lb (68.9 kg)  09/15/22 152 lb 3.2 oz (69 kg)   BP Readings from Last 3 Encounters:  01/29/23 122/74  01/27/23 (!) 181/144  09/15/22 136/88         Past Medical History:  Diagnosis Date   Balanitis    hx of   Cataract    Glaucoma    Hx of adenomatous polyp of colon 03/09/2014   Hyperlipidemia    Hypertension    Osteoarthritis    diffuse   Past Surgical History:  Procedure Laterality Date   CATARACT EXTRACTION     COLONOSCOPY  06-05-2003   hems only   TONSILLECTOMY      reports that he quit smoking about 45 years ago. His smoking use included cigarettes. He started smoking about 65 years ago. He has a 20 pack-year smoking history. He has never been exposed to tobacco smoke. He has never used smokeless tobacco. He reports that he does not drink alcohol and does not use drugs. family history includes Cancer in his father; Diabetes in his brother; Heart attack in his brother; Other (age of onset: 41) in his mother. Allergies  Allergen Reactions   Lipitor [Atorvastatin] Other (See Comments)     Uneasy feeling Myalgias    Lopid [Gemfibrozil]     Unknown reaction   Penicillins Other (See Comments)    Unknown reaction   Statins Other (See Comments)    Myalgias    Valtrex [Valacyclovir] Other (See Comments)    Unknown reaction   Vitamin B3 [Niacin] Other (See Comments)    Unknown reaction   Current Outpatient Medications on File Prior to Visit  Medication Sig Dispense Refill   Acetaminophen (TYLENOL PO) Take 1-2 tablets by mouth daily as needed (pain, headache, fever).     benzonatate (TESSALON) 200 MG capsule Take 1 capsule (200 mg total) by mouth 3 (three) times daily as needed. 60 capsule 0   carbidopa-levodopa (SINEMET CR) 50-200 MG tablet Take 1 tablet by mouth at bedtime. 90 tablet 1   carbidopa-levodopa (SINEMET IR) 25-100 MG tablet TAKE 1 1/2 TABLET BY MOUTH THREE TIMES DAILY. 8AM/ NOON/ 4PM 405 tablet 0   dorzolamide-timolol (COSOPT) 22.3-6.8 MG/ML ophthalmic solution Place 1 drop into both eyes 2 (two) times daily.     lactose free nutrition (BOOST PLUS) LIQD Take 237 mLs by mouth 3 (three) times daily with meals.  0   Multiple Vitamins-Minerals (CENTRUM SILVER ADULT 50+) TABS Take 1 tablet by mouth  daily.     solifenacin (VESICARE) 5 MG tablet Take 1 tablet (5 mg total) by mouth daily. 90 tablet 3   tamsulosin (FLOMAX) 0.4 MG CAPS capsule Take 0.4 mg by mouth daily.     TURMERIC CURCUMIN PO Take 1 tablet by mouth daily.     No current facility-administered medications on file prior to visit.        ROS:  All others reviewed and negative.  Objective        PE:  BP 122/74 (BP Location: Right Arm, Patient Position: Sitting, Cuff Size: Normal)   Pulse 60   Temp 98.4 F (36.9 C) (Oral)   Ht 6' (1.829 m)   Wt 158 lb (71.7 kg)   SpO2 98%   BMI 21.43 kg/m                 Constitutional: Pt appears in NAD               HENT: Head: NCAT.                Right Ear: External ear normal.                 Left Ear: External ear normal.                Eyes: . Pupils are  equal, round, and reactive to light. Conjunctivae and EOM are normal               Nose: without d/c or deformity               Neck: Neck supple. Gross normal ROM               Cardiovascular: Normal rate and regular rhythm.                 Pulmonary/Chest: Effort normal and breath sounds without rales or wheezing.                Abd:  Soft, NT, ND, + BS, no organomegaly               Neurological: Pt is alert. At baseline orientation, motor grossly intact               Skin: Skin is warm. No rashes, no other new lesions, LE edema - none               Psychiatric: Pt behavior is normal without agitation   Micro: none  Cardiac tracings I have personally interpreted today:  none  Pertinent Radiological findings (summarize): none   Lab Results  Component Value Date   WBC 3.2 (L) 08/18/2022   HGB 12.6 (L) 08/18/2022   HCT 37.9 (L) 08/18/2022   PLT 202.0 08/18/2022   GLUCOSE 83 08/18/2022   CHOL 280 (H) 08/18/2022   TRIG 136.0 08/18/2022   HDL 80.40 08/18/2022   LDLDIRECT 123.0 01/12/2017   LDLCALC 172 (H) 08/18/2022   ALT 7 08/18/2022   AST 18 08/18/2022   NA 140 08/18/2022   K 4.0 08/18/2022   CL 102 08/18/2022   CREATININE 1.04 08/18/2022   BUN 18 08/18/2022   CO2 33 (H) 08/18/2022   TSH 0.55 08/18/2022   PSA 3.83 06/03/2021   INR 1.1 (H) 06/03/2021   HGBA1C 5.6 08/18/2022   Assessment/Plan:  [name] Good is a 82 y.o. Black or African American [2] male with  has a past medical history of Balanitis, Cataract, Glaucoma, adenomatous polyp  of colon (03/09/2014), Hyperlipidemia, Hypertension, and Osteoarthritis.  Essential hypertension BP Readings from Last 3 Encounters:  01/29/23 122/74  01/27/23 (!) 181/144  09/15/22 136/88   Stable, pt to continue medical treatment  - diet, wt control  Hyperlipidemia Lab Results  Component Value Date   LDLCALC 172 (H) 08/18/2022   Uncontrolled,, goal ldl < 70, pt to start repatha 140 mg biweekly   Hyperglycemia Lab  Results  Component Value Date   HGBA1C 5.6 08/18/2022   Stable, pt to continue current medical treatment  - diet, wt control   Low vitamin D level Last vitamin D Lab Results  Component Value Date   VD25OH 66.11 08/18/2022   Stable, cont oral replacement   Nail disorder Ok to try again- refer podiatry  Subdural hematoma (HCC) Possible on recent CT -for refer NS Dr Franky Macho at 3 wks Followup: Return in about 3 months (around 05/01/2023).  Oliver Barre, MD 01/31/2023 7:28 PM [facility]

## 2023-01-31 ENCOUNTER — Encounter: Payer: Self-pay | Admitting: Internal Medicine

## 2023-01-31 DIAGNOSIS — S065XAA Traumatic subdural hemorrhage with loss of consciousness status unknown, initial encounter: Secondary | ICD-10-CM | POA: Insufficient documentation

## 2023-01-31 DIAGNOSIS — L609 Nail disorder, unspecified: Secondary | ICD-10-CM | POA: Insufficient documentation

## 2023-01-31 NOTE — Assessment & Plan Note (Signed)
Ok to try again- refer podiatry

## 2023-01-31 NOTE — Assessment & Plan Note (Signed)
Lab Results  Component Value Date   HGBA1C 5.6 08/18/2022   Stable, pt to continue current medical treatment  - diet, wt control

## 2023-01-31 NOTE — Assessment & Plan Note (Signed)
Last vitamin D Lab Results  Component Value Date   VD25OH 66.11 08/18/2022   Stable, cont oral replacement

## 2023-01-31 NOTE — Assessment & Plan Note (Signed)
BP Readings from Last 3 Encounters:  01/29/23 122/74  01/27/23 (!) 181/144  09/15/22 136/88   Stable, pt to continue medical treatment  - diet, wt control

## 2023-01-31 NOTE — Assessment & Plan Note (Signed)
Possible on recent CT -for refer NS Dr Franky Macho at 3 wks

## 2023-01-31 NOTE — Assessment & Plan Note (Signed)
Lab Results  Component Value Date   LDLCALC 172 (H) 08/18/2022   Uncontrolled,, goal ldl < 70, pt to start repatha 140 mg biweekly

## 2023-02-01 ENCOUNTER — Other Ambulatory Visit (HOSPITAL_COMMUNITY): Payer: Self-pay

## 2023-02-01 ENCOUNTER — Telehealth: Payer: Self-pay

## 2023-02-01 ENCOUNTER — Telehealth: Payer: Self-pay | Admitting: Internal Medicine

## 2023-02-01 NOTE — Telephone Encounter (Signed)
Patient's pharmacy called to request a prior authorization for  Evolocumab (REPATHA SURECLICK) 140 MG/ML SOAJ.

## 2023-02-01 NOTE — Telephone Encounter (Signed)
Pharmacy Patient Advocate Encounter   Received notification from Pt Calls Messages that prior authorization for Repatha SureClick 140mg /ml is required/requested.   Insurance verification completed.   The patient is insured through [employer] .   Per test claim: PA required; PA submitted to [facility] via CoverMyMeds Key/confirmation #/EOC [id number] Status is pending

## 2023-02-02 ENCOUNTER — Other Ambulatory Visit (HOSPITAL_COMMUNITY): Payer: Self-pay

## 2023-02-02 ENCOUNTER — Ambulatory Visit (INDEPENDENT_AMBULATORY_CARE_PROVIDER_SITE_OTHER): Payer: Medicare HMO | Admitting: Internal Medicine

## 2023-02-02 ENCOUNTER — Encounter: Payer: Self-pay | Admitting: Internal Medicine

## 2023-02-02 VITALS — BP 130/74 | HR 64 | Ht 72.0 in | Wt 160.4 lb

## 2023-02-02 DIAGNOSIS — E041 Nontoxic single thyroid nodule: Secondary | ICD-10-CM | POA: Diagnosis not present

## 2023-02-02 NOTE — Progress Notes (Unsigned)
Name: [name] Good  MRN/ DOB: [id number], May 02, 1940    Age/ Sex: 82 y.o., male    PCP: Corwin Levins, MD   Reason for Endocrinology Evaluation: Thyroid nodule      Date of Initial Endocrinology Evaluation: 02/02/2023     HPI: Mr. [name] Good is a 82 y.o. male with a past medical history of HTN, CVA,. The patient presented for initial endocrinology clinic visit on 02/02/2023 for consultative assistance with his thyroid nodule .   Pt was noted with an incidental finding of a right 2 cm thyroid nodule  on CT scan of the neck , during evaluation of a fall. Ultrasound 03/2022 did not reveal any thyroid nodules.    He is accompanied by his caregiver Tiffaney   Patient has been noted with a gradual weight increase  Denies local neck swelling   Denies palpitations  Denies heat intolerance  Has rare diarrhea/loose stools  Has tremors due to Parkinson's  disease   No personal history of thyroid disease     HISTORY:  Past Medical History:  Past Medical History:  Diagnosis Date  . Balanitis    hx of  . Cataract   . Glaucoma   . Hx of adenomatous polyp of colon 03/09/2014  . Hyperlipidemia   . Hypertension   . Osteoarthritis    diffuse   Past Surgical History:  Past Surgical History:  Procedure Laterality Date  . CATARACT EXTRACTION    . COLONOSCOPY  06-05-2003   hems only  . TONSILLECTOMY      Social History:  reports that he quit smoking about 45 years ago. His smoking use included cigarettes. He started smoking about 65 years ago. He has a 20 pack-year smoking history. He has never been exposed to tobacco smoke. He has never used smokeless tobacco. He reports that he does not drink alcohol and does not use drugs. Family History: family history includes Cancer in his father; Diabetes in his brother; Heart attack in his brother; Other (age of onset: 11) in his mother.   HOME MEDICATIONS: Allergies as of 02/02/2023       Reactions   Lipitor  [atorvastatin] Other (See Comments)   Uneasy feeling Myalgias    Lopid [gemfibrozil]    Unknown reaction   Penicillins Other (See Comments)   Unknown reaction   Statins Other (See Comments)   Myalgias    Valtrex [valacyclovir] Other (See Comments)   Unknown reaction   Vitamin B3 [niacin] Other (See Comments)   Unknown reaction        Medication List        Accurate as of February 02, 2023  2:06 PM. If you have any questions, ask your nurse or doctor.          benzonatate 200 MG capsule Commonly known as: TESSALON Take 1 capsule (200 mg total) by mouth 3 (three) times daily as needed.   carbidopa-levodopa 50-200 MG tablet Commonly known as: SINEMET CR Take 1 tablet by mouth at bedtime.   carbidopa-levodopa 25-100 MG tablet Commonly known as: SINEMET IR TAKE 1 1/2 TABLET BY MOUTH THREE TIMES DAILY. 8AM/ NOON/ 4PM   Centrum Silver Adult 50+ Tabs Take 1 tablet by mouth daily.   dorzolamide-timolol 2-0.5 % ophthalmic solution Commonly known as: COSOPT Place 1 drop into both eyes 2 (two) times daily.   lactose free nutrition Liqd Take 237 mLs by mouth 3 (three) times daily with meals.   Repatha SureClick 140 MG/ML Soaj  Generic drug: Evolocumab Inject 140 mg into the skin every 14 (fourteen) days.   solifenacin 5 MG tablet Commonly known as: VESIcare Take 1 tablet (5 mg total) by mouth daily.   tamsulosin 0.4 MG Caps capsule Commonly known as: FLOMAX Take 0.4 mg by mouth daily.   TURMERIC CURCUMIN PO Take 1 tablet by mouth daily.   TYLENOL PO Take 1-2 tablets by mouth daily as needed (pain, headache, fever).          REVIEW OF SYSTEMS: A comprehensive ROS was conducted with the patient and is negative except as per HPI     OBJECTIVE:  VS: BP 130/74 (BP Location: Right Arm, Patient Position: Sitting, Cuff Size: Small)   Pulse 64   Ht 6' (1.829 m)   Wt 160 lb 6.4 oz (72.8 kg)   SpO2 99%   BMI 21.75 kg/m    Wt Readings from Last 3 Encounters:   02/02/23 160 lb 6.4 oz (72.8 kg)  01/29/23 158 lb (71.7 kg)  09/16/22 152 lb (68.9 kg)     EXAM: General: Pt appears well and is in NAD  Neck: General: Supple without adenopathy. Thyroid: Thyroid size normal.  No goiter or nodules appreciated.   Lungs: Clear with good BS bilat   Heart: Auscultation: RRR.  Abdomen: Soft, nontender  Extremities:  BL LE: No pretibial edema   Mental Status: Judgment, insight: Intact Orientation: Oriented to time, place, and person Mood and affect: No depression, anxiety, or agitation     DATA REVIEWED:  Latest Reference Range & Units 02/02/23 14:35  TSH 0.35 - 5.50 uIU/mL 0.67  T4,Free(Direct) 0.60 - 1.60 ng/dL 1.61      Thyroid Ultrasound 04/16/2022 Estimated total number of nodules >/= 1 cm: 0   Number of spongiform nodules >/=  2 cm not described below (TR1): 0   Number of mixed cystic and solid nodules >/= 1.5 cm not described below (TR2): 0   _________________________________________________________   No discrete nodules are seen within the thyroid gland. Vague region of heterogeneity in the right mid gland consistent with a pseudo nodule.   IMPRESSION: As recently demonstrated on thyroid ultrasound dated 02/18/2022, no discrete thyroid nodule is present.   Mildly heterogeneous and overall enlarged thyroid gland.     ASSESSMENT/PLAN/RECOMMENDATIONS:   Right thyroid nodule:  -No local neck symptoms -Patient is clinically euthyroid -This was an incidental finding on CT imaging, thyroid ultrasound did not reveal a specific nodule but showed an area of heterogeneity -I have recommended repeating thyroid ultrasound, if this is negative no further workup will be needed -TFTs are normal  Follow-up pending thyroid ultrasound results  Signed electronically by: Lyndle Herrlich, MD  [facility]  [facility] [address] [place] [place], [place] [address] Phone: [phone] FAX:  [phone]   CC: Corwin Levins, MD [address] [place] [address] Phone: [phone] Fax: [phone]   Return to Endocrinology clinic as below: Future Appointments  Date Time Provider Department Center  02/08/2023  2:30 PM Lenn Sink, [place] [facility]  03/30/2023  3:00 PM Tat, Octaviano Batty, DO [facility] None  05/10/2023  2:00 PM Corwin Levins, MD [facility] None  09/21/2023  4:00 PM [facility]NUAL WELLNESS VISIT [facility] None

## 2023-02-02 NOTE — Telephone Encounter (Signed)
Pharmacy Patient Advocate Encounter  Received notification from [facility] that Prior Authorization for Repatha Sureclick 140mg /ml has been APPROVED from 02/01/23 to 04/18/24   PA #/Case ID/Reference #: [id number]

## 2023-02-03 LAB — TSH: TSH: 0.67 u[IU]/mL (ref 0.35–5.50)

## 2023-02-03 LAB — T4, FREE: Free T4: 0.84 ng/dL (ref 0.60–1.60)

## 2023-02-04 ENCOUNTER — Ambulatory Visit
Admission: RE | Admit: 2023-02-04 | Discharge: 2023-02-04 | Disposition: A | Discharge status: Home / Self Care | Payer: Medicare HMO | Source: Ambulatory Visit | Attending: Internal Medicine | Admitting: Internal Medicine

## 2023-02-04 DIAGNOSIS — E041 Nontoxic single thyroid nodule: Secondary | ICD-10-CM | POA: Diagnosis not present

## 2023-02-08 ENCOUNTER — Encounter: Payer: Self-pay | Admitting: Podiatry

## 2023-02-08 ENCOUNTER — Ambulatory Visit (INDEPENDENT_AMBULATORY_CARE_PROVIDER_SITE_OTHER): Payer: Medicare HMO | Admitting: Podiatry

## 2023-02-08 DIAGNOSIS — M79675 Pain in left toe(s): Secondary | ICD-10-CM

## 2023-02-08 DIAGNOSIS — B351 Tinea unguium: Secondary | ICD-10-CM

## 2023-02-08 DIAGNOSIS — M79674 Pain in right toe(s): Secondary | ICD-10-CM | POA: Diagnosis not present

## 2023-02-09 NOTE — Progress Notes (Signed)
Subjective:   Patient ID: [name] Good, male   DOB: 82 y.o.   MRN: [id number]   HPI Patient presents with severely elongated thickened nailbeds 1-5 both feet that are painful   ROS      Objective:  Physical Exam  Neurovascular status intact thick yellow brittle nailbeds 1-5 both feet painful     Assessment:  Chronic mycotic nail infections with pain 1-5 both feet     Plan:  Debridement painful nailbeds 1-5 both feet Neutra genic bleeding reappoint routine care

## 2023-02-12 ENCOUNTER — Telehealth: Payer: Self-pay | Admitting: Internal Medicine

## 2023-02-12 DIAGNOSIS — E041 Nontoxic single thyroid nodule: Secondary | ICD-10-CM

## 2023-02-12 NOTE — Telephone Encounter (Signed)
Please contact patient/caregiver and let them know that the ultrasound does show that the patient has a thyroid nodule that meets criteria for biopsy.    The reason for the biopsy is to rule out thyroid cancer which is a small risk.   The thyroid biopsy will be done at the same place with a ultrasound was performed, the patient will not be put under anesthesia but he will have local anesthesia.    Please take permission if they agree and let me know so I can order it   Thanks

## 2023-02-15 NOTE — Telephone Encounter (Signed)
LMTCB

## 2023-02-15 NOTE — Telephone Encounter (Signed)
Patient advised and  would like the biopsy ordered.

## 2023-02-16 ENCOUNTER — Telehealth: Payer: Self-pay | Admitting: Internal Medicine

## 2023-02-16 NOTE — Telephone Encounter (Signed)
Pt Daughter called checking on the status of the Handicap sticker it was talked about during their last OV and suppose to be filled out and given to them. Please advise.

## 2023-02-16 NOTE — Telephone Encounter (Signed)
Not sure about this, but I did sign a new form today - done to cma

## 2023-02-17 NOTE — Telephone Encounter (Signed)
Called and let daughter know form has been placed up front for pick up.

## 2023-02-19 ENCOUNTER — Ambulatory Visit
Admission: RE | Admit: 2023-02-19 | Discharge: 2023-02-19 | Disposition: A | Discharge status: Home / Self Care | Payer: Medicare HMO | Source: Ambulatory Visit | Attending: Internal Medicine | Admitting: Internal Medicine

## 2023-02-19 DIAGNOSIS — E041 Nontoxic single thyroid nodule: Secondary | ICD-10-CM

## 2023-02-26 ENCOUNTER — Telehealth: Payer: Self-pay

## 2023-02-26 NOTE — Telephone Encounter (Signed)
Transition Care Management Unsuccessful Follow-up Telephone Call  Date of discharge and from where:  [facility] 10/9  Attempts:  1st Attempt  Reason for unsuccessful TCM follow-up call:  No answer/busy   Lenard Forth [place]  [facility], [facility] Guide, Phone: [phone] Website: [email or web address]

## 2023-03-01 ENCOUNTER — Telehealth: Payer: Self-pay

## 2023-03-01 NOTE — Telephone Encounter (Signed)
Transition Care Management Unsuccessful Follow-up Telephone Call  Date of discharge and from where:  [name] Good 10/9  Attempts:  2nd Attempt  Reason for unsuccessful TCM follow-up call:  No answer/busy   [name] Good [place]  [facility], [facility] Guide, Phone: [phone] Website: [email or web address]

## 2023-03-02 DIAGNOSIS — S065XAA Traumatic subdural hemorrhage with loss of consciousness status unknown, initial encounter: Secondary | ICD-10-CM | POA: Diagnosis not present

## 2023-03-08 ENCOUNTER — Ambulatory Visit (HOSPITAL_COMMUNITY): Payer: Medicare HMO

## 2023-03-12 DIAGNOSIS — L409 Psoriasis, unspecified: Secondary | ICD-10-CM | POA: Diagnosis not present

## 2023-03-30 ENCOUNTER — Ambulatory Visit: Payer: Medicare HMO | Admitting: Neurology

## 2023-03-30 ENCOUNTER — Encounter: Payer: Self-pay | Admitting: Neurology

## 2023-03-31 ENCOUNTER — Other Ambulatory Visit: Payer: Self-pay

## 2023-03-31 DIAGNOSIS — G20A2 Parkinson's disease without dyskinesia, with fluctuations: Secondary | ICD-10-CM

## 2023-03-31 MED ORDER — CARBIDOPA-LEVODOPA ER 50-200 MG PO TBCR: 1.0000 | EXTENDED_RELEASE_TABLET | Freq: Every day | ORAL | 1 refills | Status: DC

## 2023-04-06 ENCOUNTER — Other Ambulatory Visit: Payer: Self-pay | Admitting: Neurology

## 2023-04-06 DIAGNOSIS — G20A2 Parkinson's disease without dyskinesia, with fluctuations: Secondary | ICD-10-CM

## 2023-05-10 ENCOUNTER — Ambulatory Visit (INDEPENDENT_AMBULATORY_CARE_PROVIDER_SITE_OTHER): Payer: Medicare HMO | Admitting: Internal Medicine

## 2023-05-10 VITALS — BP 120/76 | HR 75 | Temp 98.5°F | Ht 72.0 in | Wt 162.0 lb

## 2023-05-10 DIAGNOSIS — E7800 Pure hypercholesterolemia, unspecified: Secondary | ICD-10-CM

## 2023-05-10 DIAGNOSIS — R7989 Other specified abnormal findings of blood chemistry: Secondary | ICD-10-CM | POA: Diagnosis not present

## 2023-05-10 DIAGNOSIS — R739 Hyperglycemia, unspecified: Secondary | ICD-10-CM

## 2023-05-10 DIAGNOSIS — I10 Essential (primary) hypertension: Secondary | ICD-10-CM | POA: Diagnosis not present

## 2023-05-10 DIAGNOSIS — Z0001 Encounter for general adult medical examination with abnormal findings: Secondary | ICD-10-CM

## 2023-05-10 DIAGNOSIS — J309 Allergic rhinitis, unspecified: Secondary | ICD-10-CM

## 2023-05-10 DIAGNOSIS — K5900 Constipation, unspecified: Secondary | ICD-10-CM | POA: Diagnosis not present

## 2023-05-10 DIAGNOSIS — E538 Deficiency of other specified B group vitamins: Secondary | ICD-10-CM

## 2023-05-10 DIAGNOSIS — M353 Polymyalgia rheumatica: Secondary | ICD-10-CM | POA: Diagnosis not present

## 2023-05-10 LAB — BASIC METABOLIC PANEL
BUN: 15 mg/dL (ref 6–23)
CO2: 32 meq/L (ref 19–32)
Calcium: 9.5 mg/dL (ref 8.4–10.5)
Chloride: 102 meq/L (ref 96–112)
Creatinine, Ser: 1.11 mg/dL (ref 0.40–1.50)
GFR: 61.54 mL/min (ref >60.00)
Glucose, Bld: 98 mg/dL (ref 70–99)
Potassium: 4.3 meq/L (ref 3.5–5.1)
Sodium: 140 meq/L (ref 135–145)

## 2023-05-10 LAB — LIPID PANEL
Cholesterol: 225 mg/dL — ABNORMAL HIGH (ref 0–200)
HDL: 71.5 mg/dL (ref >39.00)
LDL Cholesterol: 136 mg/dL — ABNORMAL HIGH (ref 0–99)
NonHDL: 153.9
Total CHOL/HDL Ratio: 3
Triglycerides: 88 mg/dL (ref 0.0–149.0)
VLDL: 17.6 mg/dL (ref 0.0–40.0)

## 2023-05-10 LAB — HEPATIC FUNCTION PANEL
ALT: 4 U/L (ref 0–53)
AST: 15 U/L (ref 0–37)
Albumin: 4.5 g/dL (ref 3.5–5.2)
Alkaline Phosphatase: 56 U/L (ref 39–117)
Bilirubin, Direct: 0.1 mg/dL (ref 0.0–0.3)
Total Bilirubin: 0.5 mg/dL (ref 0.2–1.2)
Total Protein: 7.4 g/dL (ref 6.0–8.3)

## 2023-05-10 LAB — CBC WITH DIFFERENTIAL/PLATELET
Basophils Absolute: 0 10*3/uL (ref 0.0–0.1)
Basophils Relative: 1.1 % (ref 0.0–3.0)
Eosinophils Absolute: 0.2 10*3/uL (ref 0.0–0.7)
Eosinophils Relative: 7 % — ABNORMAL HIGH (ref 0.0–5.0)
HCT: 39.4 % (ref 39.0–52.0)
Hemoglobin: 12.8 g/dL — ABNORMAL LOW (ref 13.0–17.0)
Lymphocytes Relative: 35.8 % (ref 12.0–46.0)
Lymphs Abs: 1.1 10*3/uL (ref 0.7–4.0)
MCHC: 32.6 g/dL (ref 30.0–36.0)
MCV: 88.5 fL (ref 78.0–100.0)
Monocytes Absolute: 0.5 10*3/uL (ref 0.1–1.0)
Monocytes Relative: 15.8 % — ABNORMAL HIGH (ref 3.0–12.0)
Neutro Abs: 1.3 10*3/uL — ABNORMAL LOW (ref 1.4–7.7)
Neutrophils Relative %: 40.3 % — ABNORMAL LOW (ref 43.0–77.0)
Platelets: 220 10*3/uL (ref 150.0–400.0)
RBC: 4.45 Mil/uL (ref 4.22–5.81)
RDW: 13.1 % (ref 11.5–15.5)
WBC: 3.1 10*3/uL — ABNORMAL LOW (ref 4.0–10.5)

## 2023-05-10 LAB — SEDIMENTATION RATE: Sed Rate: 42 mm/h — ABNORMAL HIGH (ref 0–20)

## 2023-05-10 LAB — TSH: TSH: 0.61 u[IU]/mL (ref 0.35–5.50)

## 2023-05-10 LAB — C-REACTIVE PROTEIN: CRP: 1.3 mg/dL (ref 0.5–20.0)

## 2023-05-10 LAB — HEMOGLOBIN A1C: Hgb A1c MFr Bld: 5.8 % (ref 4.6–6.5)

## 2023-05-10 NOTE — Progress Notes (Unsigned)
Patient ID: [name] Good, male   DOB: 1940/04/26, 83 y.o.   MRN: [id number]         Chief Complaint:: wellness exam and htn, hld, hyperglycemia, low vit d, PMR       HPI:  [name] Good is a 83 y.o. male here for wellness exam; for shingrix at pharmacy, o/w up to date                        Also tolerating repatha ok but now $279, cannot afford., but does want lipid rechecked. Pt denies chest pain, increased sob or doe, wheezing, orthopnea, PND, increased LE swelling, palpitations, dizziness or syncope.   Pt denies polydipsia, polyuria, or new focal neuro s/s.    Pt denies fever, wt loss, night sweats, loss of appetite, or other constitutional symptoms  Does have several wks ongoing nasal allergy symptoms with clearish congestion, itch and sneezing, without fever, pain, ST, cough, swelling or wheezing.  Denies worsening reflux, abd pain, dysphagia, n/v, or blood but does have mild intermittent constipation.  Has overall aching all over, Has slow movements today but daughter states this is how he is between PD med dosing.      Wt Readings from Last 3 Encounters:  05/10/23 162 lb (73.5 kg)  02/02/23 160 lb 6.4 oz (72.8 kg)  01/29/23 158 lb (71.7 kg)   BP Readings from Last 3 Encounters:  05/10/23 120/76  02/02/23 130/74  01/29/23 122/74   Immunization History  Administered Date(s) Administered   Fluad Trivalent(High Dose 65+) 01/29/2023   Influenza Split 03/03/2011, 02/09/2012   Influenza Whole 04/06/2007, 01/31/2008, 02/26/2009, 03/21/2010   Influenza, High Dose Seasonal PF 02/14/2013, 01/09/2014, 01/12/2017, 01/27/2018   Influenza,inj,Quad PF,6+ Mos 12/10/2015   Influenza-Unspecified 01/05/2015   PFIZER(Purple Top)SARS-COV-2 Vaccination 06/16/2019, 07/12/2019, 02/03/2020   Pneumococcal Conjugate-13 11/29/2013   Pneumococcal Polysaccharide-23 09/02/2009, 03/03/2011   Td 12/19/1997, 02/26/2009   Tdap 05/23/2019   Zoster Recombinant(Shingrix) 08/28/2016   Zoster, Live  11/28/2014   Health Maintenance Due  Topic Date Due   Zoster Vaccines- Shingrix (2 of 2) 10/23/2016      Past Medical History:  Diagnosis Date   Balanitis    hx of   Cataract    Glaucoma    Hx of adenomatous polyp of colon 03/09/2014   Hyperlipidemia    Hypertension    Osteoarthritis    diffuse   Past Surgical History:  Procedure Laterality Date   CATARACT EXTRACTION     COLONOSCOPY  06-05-2003   hems only   TONSILLECTOMY      reports that he quit smoking about 46 years ago. His smoking use included cigarettes. He started smoking about 66 years ago. He has a 20 pack-year smoking history. He has never been exposed to tobacco smoke. He has never used smokeless tobacco. He reports that he does not drink alcohol and does not use drugs. family history includes Cancer in his father; Diabetes in his brother; Heart attack in his brother; Other (age of onset: 23) in his mother. Allergies  Allergen Reactions   Lipitor [Atorvastatin] Other (See Comments)    Uneasy feeling Myalgias    Lopid [Gemfibrozil]     Unknown reaction   Penicillins Other (See Comments)    Unknown reaction   Statins Other (See Comments)    Myalgias    Valtrex [Valacyclovir] Other (See Comments)    Unknown reaction   Vitamin B3 [Niacin] Other (See Comments)    Unknown reaction  Current Outpatient Medications on File Prior to Visit  Medication Sig Dispense Refill   Acetaminophen (TYLENOL PO) Take 1-2 tablets by mouth daily as needed (pain, headache, fever).     benzonatate (TESSALON) 200 MG capsule Take 1 capsule (200 mg total) by mouth 3 (three) times daily as needed. 60 capsule 0   carbidopa-levodopa (SINEMET CR) 50-200 MG tablet Take 1 tablet by mouth at bedtime. 90 tablet 1   carbidopa-levodopa (SINEMET IR) 25-100 MG tablet TAKE 1 1/2 TABLET BY MOUTH THREE TIMES DAILY. 8AM/ NOON/ 4PM 405 tablet 0   dorzolamide-timolol (COSOPT) 22.3-6.8 MG/ML ophthalmic solution Place 1 drop into both eyes 2 (two) times  daily.     Evolocumab (REPATHA SURECLICK) 140 MG/ML SOAJ Inject 140 mg into the skin every 14 (fourteen) days. 6 mL 3   lactose free nutrition (BOOST PLUS) LIQD Take 237 mLs by mouth 3 (three) times daily with meals.  0   Multiple Vitamins-Minerals (CENTRUM SILVER ADULT 50+) TABS Take 1 tablet by mouth daily.     solifenacin (VESICARE) 5 MG tablet Take 1 tablet (5 mg total) by mouth daily. 90 tablet 3   tamsulosin (FLOMAX) 0.4 MG CAPS capsule Take 0.4 mg by mouth daily.     TURMERIC CURCUMIN PO Take 1 tablet by mouth daily.     No current facility-administered medications on file prior to visit.        ROS:  All others reviewed and negative.  Objective        PE:  BP 120/76 (BP Location: Right Arm, Patient Position: Sitting, Cuff Size: Large)   Pulse 75   Temp 98.5 F (36.9 C) (Oral)   Ht 6' (1.829 m)   Wt 162 lb (73.5 kg)   SpO2 100%   BMI 21.97 kg/m                 Constitutional: Pt appears in NAD               HENT: Head: NCAT.                Right Ear: External ear normal.                 Left Ear: External ear normal.                Eyes: . Pupils are equal, round, and reactive to light. Conjunctivae and EOM are normal               Nose: without d/c or deformity               Neck: Neck supple. Gross normal ROM               Cardiovascular: Normal rate and regular rhythm.                 Pulmonary/Chest: Effort normal and breath sounds without rales or wheezing.                Abd:  Soft, NT, ND, + BS, no organomegaly               Neurological: Pt is alert. At baseline orientation, motor grossly intact               Skin: Skin is warm. No rashes, no other new lesions, LE edema - none               Psychiatric: Pt behavior is normal without agitation   Micro:  none  Cardiac tracings I have personally interpreted today:  none  Pertinent Radiological findings (summarize): none   Lab Results  Component Value Date   WBC 3.1 (L) 05/10/2023   HGB 12.8 (L) 05/10/2023    HCT 39.4 05/10/2023   PLT 220.0 05/10/2023   GLUCOSE 98 05/10/2023   CHOL 225 (H) 05/10/2023   TRIG 88.0 05/10/2023   HDL 71.50 05/10/2023   LDLDIRECT 123.0 01/12/2017   LDLCALC 136 (H) 05/10/2023   ALT 4 05/10/2023   AST 15 05/10/2023   NA 140 05/10/2023   K 4.3 05/10/2023   CL 102 05/10/2023   CREATININE 1.11 05/10/2023   BUN 15 05/10/2023   CO2 32 05/10/2023   TSH 0.61 05/10/2023   PSA 3.83 06/03/2021   INR 1.1 (H) 06/03/2021   HGBA1C 5.8 05/10/2023   Assessment/Plan:  KILEY OVERDORF is a 83 y.o. Black or African American [2] male with  has a past medical history of Balanitis, Cataract, Glaucoma, adenomatous polyp of colon (03/09/2014), Hyperlipidemia, Hypertension, and Osteoarthritis.  Encounter for well adult exam with abnormal findings Age and sex appropriate education and counseling updated with regular exercise and diet Referrals for preventative services - none needed Immunizations addressed - for shingrix at pharmacy Smoking counseling  - none needed Evidence for depression or other mood disorder - none significant Most recent labs reviewed. I have personally reviewed and have noted: 1) the patient's medical and social history 2) The patient's current medications and supplements 3) The patient's height, weight, and BMI have been recorded in the chart   Constipation Mild intermittent, for miralax every day prn  Essential hypertension BP Readings from Last 3 Encounters:  05/10/23 120/76  02/02/23 130/74  01/29/23 122/74   Stable, pt to continue medical treatment  - diet, wt control  Hyperglycemia Lab Results  Component Value Date   HGBA1C 5.8 05/10/2023   Stable, pt to continue current medical treatment  - diet, wt control   Hyperlipidemia Lab Results  Component Value Date   LDLCALC 136 (H) 05/10/2023   Uncontrolled, has been statin intolerant, pt to start zetia 10 qd   Low vitamin D level Last vitamin D Lab Results  Component Value Date    VD25OH 63.83 05/10/2023   Stable, cont oral replacement   Polymyalgia rheumatica (HCC) Also for ESR  Allergic rhinitis Mild to mod, for allegra 180 every day prn,  to f/u any worsening symptoms or concerns  Followup: Return in about 6 months (around 11/07/2023).  Oliver Barre, MD 05/11/2023 8:49 PM [facility]

## 2023-05-10 NOTE — Patient Instructions (Addendum)
Please continue all other medications as before, and refills have been done if requested.  Please have the pharmacy call with any other refills you may need.  Please continue your efforts at being more active, low cholesterol diet, and weight control.  You are otherwise up to date with prevention measures today.  Please keep your appointments with your specialists as you may have planned- neurology  Please go to the LAB at the blood drawing area for the tests to be done  You will be contacted by phone if any changes need to be made immediately.  Otherwise, you will receive a letter about your results with an explanation, but please check with MyChart first.  Please make an Appointment to return in 6 months, or sooner if needed

## 2023-05-11 ENCOUNTER — Other Ambulatory Visit: Payer: Medicare HMO

## 2023-05-11 ENCOUNTER — Encounter: Payer: Self-pay | Admitting: Internal Medicine

## 2023-05-11 ENCOUNTER — Other Ambulatory Visit: Payer: Self-pay | Admitting: Internal Medicine

## 2023-05-11 DIAGNOSIS — J309 Allergic rhinitis, unspecified: Secondary | ICD-10-CM | POA: Insufficient documentation

## 2023-05-11 LAB — URINALYSIS, ROUTINE W REFLEX MICROSCOPIC
Bilirubin Urine: NEGATIVE
Hgb urine dipstick: NEGATIVE
Ketones, ur: NEGATIVE
Leukocytes,Ua: NEGATIVE
Nitrite: NEGATIVE
RBC / HPF: NONE SEEN (ref 0–?)
Specific Gravity, Urine: 1.015 (ref 1.000–1.030)
Total Protein, Urine: NEGATIVE
Urine Glucose: NEGATIVE
Urobilinogen, UA: 0.2 (ref 0.0–1.0)
pH: 7.5 (ref 5.0–8.0)

## 2023-05-11 LAB — VITAMIN D 25 HYDROXY (VIT D DEFICIENCY, FRACTURES): VITD: 63.83 ng/mL (ref 30.00–100.00)

## 2023-05-11 LAB — VITAMIN B12: Vitamin B-12: 1247 pg/mL — ABNORMAL HIGH (ref 211–911)

## 2023-05-11 MED ORDER — EZETIMIBE 10 MG PO TABS: 10.0000 mg | ORAL_TABLET | Freq: Every day | ORAL | 3 refills | Status: AC

## 2023-05-11 NOTE — Assessment & Plan Note (Signed)
Mild to mod, for allegra 180 every day prn,  to f/u any worsening symptoms or concerns

## 2023-05-11 NOTE — Assessment & Plan Note (Signed)
BP Readings from Last 3 Encounters:  05/10/23 120/76  02/02/23 130/74  01/29/23 122/74   Stable, pt to continue medical treatment  - diet, wt control

## 2023-05-11 NOTE — Assessment & Plan Note (Signed)
Last vitamin D Lab Results  Component Value Date   VD25OH 63.83 05/10/2023   Stable, cont oral replacement

## 2023-05-11 NOTE — Assessment & Plan Note (Signed)

## 2023-05-11 NOTE — Assessment & Plan Note (Signed)
Mild intermittent, for miralax every day prn

## 2023-05-11 NOTE — Assessment & Plan Note (Signed)
Lab Results  Component Value Date   HGBA1C 5.8 05/10/2023   Stable, pt to continue current medical treatment  - diet, wt control

## 2023-05-11 NOTE — Assessment & Plan Note (Signed)
Lab Results  Component Value Date   LDLCALC 136 (H) 05/10/2023   Uncontrolled, has been statin intolerant, pt to start zetia 10 qd

## 2023-05-11 NOTE — Assessment & Plan Note (Signed)
Also for ESR

## 2023-05-12 ENCOUNTER — Ambulatory Visit: Payer: Medicare HMO | Admitting: Podiatry

## 2023-05-20 ENCOUNTER — Ambulatory Visit: Payer: Medicare HMO | Admitting: Podiatry

## 2023-06-10 ENCOUNTER — Ambulatory Visit: Payer: Medicare HMO | Admitting: Podiatry

## 2023-07-02 ENCOUNTER — Other Ambulatory Visit: Payer: Self-pay | Admitting: Neurology

## 2023-07-02 DIAGNOSIS — G20A2 Parkinson's disease without dyskinesia, with fluctuations: Secondary | ICD-10-CM

## 2023-07-21 ENCOUNTER — Ambulatory Visit: Payer: Medicare HMO | Admitting: Podiatry

## 2023-07-21 NOTE — Progress Notes (Unsigned)
 Assessment/Plan:    Parkinson's disease, likely idiopathic.  Disease was diagnosed in January, 2024, but likely had the disease for many years prior -Patient does have evidence of bilateral basal ganglia infarcts, so vascular parkinsonism cannot be ruled out.  However, clinically, the patient really does not look like he has vascular parkinsonism.             -increase carbidopa/levodopa 25/100, 2 tablets 3 times per day at 8am/noon/4pm.             -continue carbidopa/levodopa 50/200 CR   2.  Sialorrhea             -Discussed Botox again but his drooling is improved post starting levodopa and he wants to hold right now   3.  Eyelid opening apraxia             -discussed this can be associated since Parkinsons Disease              -Discussed Botox.  He notes improvement since starting levodopa 4.  SDH  -01/2024, chronic  -Dr. Franky Macho  Subjective:   [name] Good was seen today in follow up for Parkinsons disease.  My previous records were reviewed prior to todays visit as well as outside records available to me. Pt with [name] (caregiver) who supplements hx. I have not seen the patient in almost a year (canceled his December follow-up).  When I saw him a year ago, I did increase his levodopa a little bit.  He tolerated that well, without side effects.  We also added that bedtime levodopa because he was getting up multiple times in the middle of the night.  He was in the ED in October for a fall.  CT demonstrated a 5 mm chronic SDH.  He was told to f/u with Dr. Franky Macho, which he did.  He also had an acute fx of the R C5 transverse process.   He reports no falls since then.  Drooling is "real good."  Current prescribed movement disorder medications: Carbidopa/levodopa 25/100, 1.5 tablets 3 times per day (increased) Carbidopa/levodopa 50/200 at bedtime (added last visit)   ALLERGIES:   Allergies  Allergen Reactions   Lipitor [Atorvastatin] Other (See Comments)    Uneasy  feeling Myalgias    Lopid [Gemfibrozil]     Unknown reaction   Penicillins Other (See Comments)    Unknown reaction   Statins Other (See Comments)    Myalgias    Valtrex [Valacyclovir] Other (See Comments)    Unknown reaction   Vitamin B3 [Niacin] Other (See Comments)    Unknown reaction    CURRENT MEDICATIONS:  No outpatient medications have been marked as taking for the 07/23/23 encounter (Office Visit) with Nicoya Friel, Octaviano Batty, DO.     Objective:   PHYSICAL EXAMINATION:    VITALS:   Vitals:   07/23/23 0908  BP: 120/70  SpO2: 97%  Weight: 158 lb 6.4 oz (71.8 kg)  Height: 6' (1.829 m)     GEN:  The patient appears stated age and is in NAD. HEENT:  Normocephalic, atraumatic.  The mucous membranes are moist. The superficial temporal arteries are without ropiness or tenderness. CV:  RRR Lungs:  CTAB Neck/HEME:  There are no carotid bruits bilaterally.  Neurological examination:  Orientation: The patient is alert and oriented x3. Cranial nerves: There is good facial symmetry with facial hypomimia. The speech is stuttering. Soft palate rises symmetrically and there is no tongue deviation. Hearing is intact to conversational  tone. Sensation: Sensation is intact to light touch throughout Motor: Strength is at least antigravity x4.  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  He is slow to push off and arise but looks much better.  He is flexed at the knees instead of the waist.  He shuffles.  He freezes in the turn.  I have reviewed and interpreted the following labs independently    Chemistry      Component Value Date/Time   NA 140 05/10/2023 1512   K 4.3 05/10/2023 1512   CL 102 05/10/2023 1512   CO2 32 05/10/2023 1512   BUN 15 05/10/2023 1512   CREATININE 1.11 05/10/2023 1512       Component Value Date/Time   CALCIUM 9.5 05/10/2023 1512   ALKPHOS 56 05/10/2023 1512   AST 15 05/10/2023 1512   ALT 4 05/10/2023 1512   BILITOT 0.5 05/10/2023 1512       Lab Results  Component Value Date   WBC 3.1 (L) 05/10/2023   HGB 12.8 (L) 05/10/2023   HCT 39.4 05/10/2023   MCV 88.5 05/10/2023   PLT 220.0 05/10/2023    Lab Results  Component Value Date   TSH 0.61 05/10/2023     Total time spent on today's visit was 30 minutes, including both face-to-face time and nonface-to-face time.  Time included that spent on review of records (prior notes available to me/labs/imaging if pertinent), discussing treatment and goals, answering patient's questions and coordinating care.  Cc:  Corwin Levins, MD

## 2023-07-23 ENCOUNTER — Ambulatory Visit (INDEPENDENT_AMBULATORY_CARE_PROVIDER_SITE_OTHER): Payer: Medicare HMO | Admitting: Neurology

## 2023-07-23 ENCOUNTER — Encounter: Payer: Self-pay | Admitting: Neurology

## 2023-07-23 DIAGNOSIS — G20A2 Parkinson's disease without dyskinesia, with fluctuations: Secondary | ICD-10-CM

## 2023-07-23 DIAGNOSIS — H401133 Primary open-angle glaucoma, bilateral, severe stage: Secondary | ICD-10-CM | POA: Diagnosis not present

## 2023-07-23 DIAGNOSIS — H04123 Dry eye syndrome of bilateral lacrimal glands: Secondary | ICD-10-CM | POA: Diagnosis not present

## 2023-07-23 MED ORDER — CARBIDOPA-LEVODOPA 25-100 MG PO TABS: 2.0000 | ORAL_TABLET | Freq: Three times a day (TID) | ORAL | Status: DC

## 2023-07-23 NOTE — Patient Instructions (Signed)
 Increase carbidopa/levodopa 25/100, 2 tablets three times per day at 8am/noon/4pm  Continue carbidopa/levodopa 50/200 at bed  I will send PT to the home

## 2023-07-28 ENCOUNTER — Encounter: Payer: Self-pay | Admitting: Podiatry

## 2023-07-28 ENCOUNTER — Ambulatory Visit (INDEPENDENT_AMBULATORY_CARE_PROVIDER_SITE_OTHER): Admitting: Podiatry

## 2023-07-28 DIAGNOSIS — M79674 Pain in right toe(s): Secondary | ICD-10-CM

## 2023-07-28 DIAGNOSIS — B351 Tinea unguium: Secondary | ICD-10-CM

## 2023-07-28 DIAGNOSIS — M79675 Pain in left toe(s): Secondary | ICD-10-CM | POA: Diagnosis not present

## 2023-07-29 NOTE — Progress Notes (Signed)
 Subjective:   Patient ID: [name] Good, male   DOB: 83 y.o.   MRN: [id number]   HPI Patient presents with severe elongation nailbeds 1-5 both feet very thick and dystrophic and painful with questions about removal of big toenails by caregiver   ROS      Objective:  Physical Exam  H&P done digital perfusion discussed and discussed thick yellow brittle nailbeds 1-5 both feet painful when pressed     Assessment:  Chronic mycotic nail infection severe 1-5 both feet painful     Plan:  H&P reviewed nail removal to organ to hold off currently as he does have Parkinson's and is relatively poor health.  Debridement of nailbeds 1-5 both feet no iatrogenic bleeding and reappoint to recheck as needed

## 2023-09-21 ENCOUNTER — Ambulatory Visit (INDEPENDENT_AMBULATORY_CARE_PROVIDER_SITE_OTHER): Payer: Medicare HMO

## 2023-09-21 VITALS — Ht 72.0 in | Wt 158.0 lb

## 2023-09-21 DIAGNOSIS — Z0000 Encounter for general adult medical examination without abnormal findings: Secondary | ICD-10-CM

## 2023-09-21 NOTE — Progress Notes (Signed)
 Subjective:   [name] Good is a 83 y.o. who presents for a Medicare Wellness preventive visit.  As a reminder, Annual Wellness Visits don't include a physical exam, and some assessments may be limited, especially if this visit is performed virtually. We may recommend an in-person follow-up visit with your provider if needed.  Visit Complete: Virtual I connected with  Barb Bonito on 09/21/23 by a audio enabled telemedicine application and verified that I am speaking with the correct person using two identifiers.    Patient Location: Home  Provider Location: Office/Clinic  I discussed the limitations of evaluation and management by telemedicine. The patient expressed understanding and agreed to proceed.  Vital Signs: Because this visit was a virtual/telehealth visit, some criteria may be missing or patient reported. Any vitals not documented were not able to be obtained and vitals that have been documented are patient reported.  VideoDeclined- This patient declined Librarian, academic. Therefore the visit was completed with audio only.  Persons Participating in Visit: Patient. And Appointment was assisted by Caregiver, [name].  AWV Questionnaire: No: Patient Medicare AWV questionnaire was not completed prior to this visit.  Cardiac Risk Factors include: advanced age (>40men, >34 women);hypertension;dyslipidemia;male gender;Other (see comment), Risk factor comments: Parkinson's Disease     Objective:     Today's Vitals   09/21/23 1532  Weight: 158 lb (71.7 kg)  Height: 6' (1.829 m)   Body mass index is 21.43 kg/m.     09/21/2023    3:32 PM 07/23/2023    9:07 AM 09/16/2022    4:07 PM 09/15/2022    2:57 PM 04/27/2022    2:36 PM 02/17/2022   10:30 PM 05/19/2017    9:35 PM  Advanced Directives  Does Patient Have a Medical Advance Directive? No Yes Yes Yes Yes No No  Type of Advance Directive  Living will Healthcare Power of [place];Living  will Living will Living will    Copy of Healthcare Power of Attorney in Chart?   No - copy requested      Would patient like information on creating a medical advance directive? No - Patient declined     No - Patient declined     Current Medications (verified) Outpatient Encounter Medications as of 09/21/2023  Medication Sig   benzonatate  (TESSALON ) 200 MG capsule Take 1 capsule (200 mg total) by mouth 3 (three) times daily as needed.   carbidopa -levodopa  (SINEMET  CR) 50-200 MG tablet Take 1 tablet by mouth at bedtime.   carbidopa -levodopa  (SINEMET  IR) 25-100 MG tablet Take 2 tablets by mouth 3 (three) times daily.   dorzolamide -timolol  (COSOPT ) 22.3-6.8 MG/ML ophthalmic solution Place 1 drop into both eyes 2 (two) times daily.   ezetimibe  (ZETIA ) 10 MG tablet Take 1 tablet (10 mg total) by mouth daily.   lactose free nutrition (BOOST PLUS) LIQD Take 237 mLs by mouth 3 (three) times daily with meals.   Multiple Vitamins-Minerals (CENTRUM SILVER ADULT 50+) TABS Take 1 tablet by mouth daily.   solifenacin  (VESICARE ) 5 MG tablet Take 1 tablet (5 mg total) by mouth daily.   tamsulosin (FLOMAX) 0.4 MG CAPS capsule Take 0.4 mg by mouth daily.   TURMERIC CURCUMIN PO Take 1 tablet by mouth daily.   No facility-administered encounter medications on file as of 09/21/2023.    Allergies (verified) Lipitor [atorvastatin], Lopid [gemfibrozil], Penicillins, Statins, Valtrex [valacyclovir], and Vitamin b3 [niacin]   History: Past Medical History:  Diagnosis Date   Balanitis    hx of  Cataract    Glaucoma    Hx of adenomatous polyp of colon 03/09/2014   Hyperlipidemia    Hypertension    Osteoarthritis    diffuse   Past Surgical History:  Procedure Laterality Date   CATARACT EXTRACTION     COLONOSCOPY  06-05-2003   hems only   TONSILLECTOMY     Family History  Problem Relation Age of Onset   Other Mother 43       CVA   Cancer Father        lung   Diabetes Brother    Heart attack  Brother    Coronary artery disease Neg Hx    Colon cancer Neg Hx    Social History   Socioeconomic History   Marital status: Widowed    Spouse name: Not on file   Number of children: 0   Years of education: 42   Highest education level: Bachelor's degree (e.g., BA, AB, BS)  Occupational History   Occupation: Engineer, materials: [employer]  Tobacco Use   Smoking status: Former    Current packs/day: 0.00    Average packs/day: 1 pack/day for 20.0 years (20.0 ttl pk-yrs)    Types: Cigarettes    Start date: 04/20/1957    Quit date: 04/20/1977    Years since quitting: 46.4    Passive exposure: Never   Smokeless tobacco: Never  Vaping Use   Vaping status: Never Used  Substance and Sexual Activity   Alcohol use: No    Alcohol/week: 0.0 standard drinks of alcohol   Drug use: No   Sexual activity: Not Currently    Partners: Female  Other Topics Concern   Not on file  Social History Narrative   HSG, Alyce Baba, [place]. Married '62.    No children. Work - Engineer, production. Lives with wife - iADLs. End of Life Care - provided packet (July '12)      Minister; Still pastor   Right handed    Social Drivers of Health   Financial Resource Strain: Low Risk  (09/21/2023)   Overall Financial Resource Strain (CARDIA)    Difficulty of Paying Living Expenses: Not hard at all  Food Insecurity: No Food Insecurity (09/21/2023)   Hunger Vital Sign    Worried About Running Out of Food in the Last Year: Never true    Ran Out of Food in the Last Year: Never true  Transportation Needs: No Transportation Needs (09/21/2023)   PRAPARE - Administrator, Civil Service (Medical): No    Lack of Transportation (Non-Medical): No  Physical Activity: Insufficiently Active (09/21/2023)   Exercise Vital Sign    Days of Exercise per Week: 6 days    Minutes of Exercise per Session: 20 min  Stress: No Stress Concern Present (09/21/2023)   [employer] of Occupational Health -  Occupational Stress Questionnaire    Feeling of Stress : Not at all  Social Connections: Moderately Integrated (09/21/2023)   Social Connection and Isolation Panel [NHANES]    Frequency of Communication with Friends and Family: More than three times a week    Frequency of Social Gatherings with Friends and Family: More than three times a week    Attends Religious Services: More than 4 times per year    Active Member of [employer] or Organizations: Yes    Attends Banker Meetings: More than 4 times per year    Marital Status: Widowed    Tobacco Counseling Counseling given: No  Clinical Intake:  Pre-visit preparation completed: Yes  Pain : No/denies pain     BMI - recorded: 21.43 Nutritional Status: BMI of 19-24  Normal Nutritional Risks: None Diabetes: No  Lab Results  Component Value Date   HGBA1C 5.8 05/10/2023   HGBA1C 5.6 08/18/2022   HGBA1C 5.7 09/01/2021     How often do you need to have someone help you when you read instructions, pamphlets, or other written materials from your doctor or pharmacy?: 5 - Always (Brother/Caregiver helps)  Interpreter Needed?: No  Comments: Caregiver helps greatly with clarification of information Information entered by :: Kandy Orris, CMA   Activities of Daily Living     09/21/2023    3:35 PM  In your present state of health, do you have any difficulty performing the following activities:  Hearing? 0  Vision? 0  Difficulty concentrating or making decisions? 1  Comment Brother/Caregiver assists  Walking or climbing stairs? 1  Comment Parkinson's Dz  Dressing or bathing? 1  Comment Parkinson's Dz  Doing errands, shopping? 1  Comment Forensic scientist and eating ? Y  Comment Caregiver assists  Using the Toilet? Y  Comment Caregiver assists  In the past six months, have you accidently leaked urine? N  Do you have problems with loss of bowel control? N  Managing your Medications? Y   Comment Caregiver/Brother assists  Managing your Finances? Y  Comment Brother Hotel manager your Housekeeping? Y  Comment Caregiver assists    Patient Care Team: Roslyn Coombe, MD as PCP - General (Internal Medicine) Karmen Pa, MD as Consulting Physician (Ophthalmology)  I have updated your Care Teams any recent Medical Services you may have received from other providers in the past year.     Assessment:    This is a routine wellness examination for Zayden.  Hearing/Vision screen Hearing Screening - Comments:: Denies hearing difficulties   Vision Screening - Comments:: Wears rx glasses - up to date with routine eye exams with Dr Paulene Boron   Goals Addressed               This Visit's Progress     Patient Stated (pt-stated)        Patient stated he wants to stay active       Depression Screen     09/21/2023    3:45 PM 05/10/2023    2:17 PM 01/29/2023    1:47 PM 09/16/2022    4:08 PM 08/18/2022    1:26 PM 04/02/2022    9:56 AM 09/10/2021    1:35 PM  PHQ 2/9 Scores  PHQ - 2 Score 0 0 0 0 0 0 0  PHQ- 9 Score 3   0  0 0    Fall Risk     09/21/2023    3:36 PM 07/23/2023    9:24 AM 07/23/2023    9:07 AM 05/10/2023    2:17 PM 01/29/2023    1:46 PM  Fall Risk   Falls in the past year? 1 1 0 0 0  Number falls in past yr: 0 0 0 0 0  Comment 1      Injury with Fall? 0 0 0 0 0  Risk for fall due to : History of fall(s)   No Fall Risks No Fall Risks  Follow up Falls evaluation completed;Falls prevention discussed Falls evaluation completed Falls evaluation completed Falls evaluation completed Falls evaluation completed    MEDICARE RISK AT HOME:  Medicare  Risk at Home Any stairs in or around the home?: No If so, are there any without handrails?: No Home free of loose throw rugs in walkways, pet beds, electrical cords, etc?: Yes Adequate lighting in your home to reduce risk of falls?: Yes Life alert?: No Use of a cane, walker or w/c?: No Grab bars in the  bathroom?: Yes Shower chair or bench in shower?: Yes Elevated toilet seat or a handicapped toilet?: Yes  TIMED UP AND GO:  Was the test performed?  No  Cognitive Function: 6CIT completed    09/16/2022    4:31 PM 01/12/2017    1:54 PM 11/28/2014   10:47 AM  MMSE - Mini Mental State Exam  Not completed: Unable to complete Unable to complete Unable to complete        09/21/2023    3:38 PM 09/10/2021    1:38 PM  6CIT Screen  What Year? 0 points 0 points  What month? 0 points 0 points  What time? 0 points 3 points  Count back from 20 0 points 0 points  Months in reverse 4 points 0 points  Repeat phrase 0 points 10 points  Total Score 4 points 13 points    Immunizations Immunization History  Administered Date(s) Administered   Fluad Trivalent(High Dose 65+) 01/29/2023   Influenza Split 03/03/2011, 02/09/2012   Influenza Whole 04/06/2007, 01/31/2008, 02/26/2009, 03/21/2010   Influenza, High Dose Seasonal PF 02/14/2013, 01/09/2014, 01/12/2017, 01/27/2018   Influenza,inj,Quad PF,6+ Mos 12/10/2015   Influenza-Unspecified 01/05/2015   PFIZER(Purple Top)SARS-COV-2 Vaccination 06/16/2019, 07/12/2019, 02/03/2020   Pneumococcal Conjugate-13 11/29/2013   Pneumococcal Polysaccharide-23 09/02/2009, 03/03/2011   Td 12/19/1997, 02/26/2009   Tdap 05/23/2019   Zoster Recombinant(Shingrix) 08/28/2016   Zoster, Live 11/28/2014    Screening Tests Health Maintenance  Topic Date Due   Zoster Vaccines- Shingrix (2 of 2) 10/23/2016   INFLUENZA VACCINE  11/19/2023   Medicare Annual Wellness (AWV)  09/20/2024   DTaP/Tdap/Td (4 - Td or Tdap) 05/22/2029   Pneumonia Vaccine 42+ Years old  Completed   HPV VACCINES  Aged Out   Meningococcal B Vaccine  Aged Out   COVID-19 Vaccine  Discontinued    Health Maintenance  Health Maintenance Due  Topic Date Due   Zoster Vaccines- Shingrix (2 of 2) 10/23/2016   Health Maintenance Items Addressed: 09/21/2023   Additional Screening:  Vision  Screening: Recommended annual ophthalmology exams for early detection of glaucoma and other disorders of the eye.    Dental Screening: Recommended annual dental exams for proper oral hygiene  Community Resource Referral / Chronic Care Management: CRR required this visit?  No   CCM required this visit?  No   Plan:    I have personally reviewed and noted the following in the patient's chart:   Medical and social history Use of alcohol, tobacco or illicit drugs  Current medications and supplements including opioid prescriptions. Patient is not currently taking opioid prescriptions. Functional ability and status Nutritional status Physical activity Advanced directives List of other physicians Hospitalizations, surgeries, and ER visits in previous 12 months Vitals Screenings to include cognitive, depression, and falls Referrals and appointments  In addition, I have reviewed and discussed with patient certain preventive protocols, quality metrics, and best practice recommendations. A written personalized care plan for preventive services as well as general preventive health recommendations were provided to patient.   Patria Bookbinder, CMA   09/21/2023   After Visit Summary: (Declined) Due to this being a telephonic visit, with patients personalized  plan was offered to patient but patient Declined AVS at this time   Notes: Nothing significant to report at this time.

## 2023-09-21 NOTE — Patient Instructions (Signed)
 Mr. [name] Good , Thank you for taking time out of your busy schedule to complete your Annual Wellness Visit with me. I enjoyed our conversation and look forward to speaking with you again next year. I, as well as your care team,  appreciate your ongoing commitment to your health goals. Please review the following plan we discussed and let me know if I can assist you in the future. Your Game plan/ To Do List   Follow up Visits: Next Medicare AWV with our clinical staff: 09/22/2024   Have you seen your provider in the last 6 months (3 months if uncontrolled diabetes)? Yes Next Office Visit with your provider: 11/08/2023  Clinician Recommendations:  Aim for 30 minutes of exercise or brisk walking, 6-8 glasses of water, and 5 servings of fruits and vegetables each day. Educated and advised on getting the 2nd Shingles vaccine at local pharmacy in 2025.      This is a list of the screening recommended for you and due dates:  Health Maintenance  Topic Date Due   Zoster (Shingles) Vaccine (2 of 2) 10/23/2016   Flu Shot  11/19/2023   Medicare Annual Wellness Visit  09/20/2024   DTaP/Tdap/Td vaccine (4 - Td or Tdap) 05/22/2029   Pneumonia Vaccine  Completed   HPV Vaccine  Aged Out   Meningitis B Vaccine  Aged Out   COVID-19 Vaccine  Discontinued    Advanced directives: (Declined) Advance directive discussed with you today. Even though you declined this today, please call our office should you change your mind, and we can give you the proper paperwork for you to fill out. Advance Care Planning is important because it:  [x]  Makes sure you receive the medical care that is consistent with your values, goals, and preferences  [x]  It provides guidance to your family and loved ones and reduces their decisional burden about whether or not they are making the right decisions based on your wishes.  Follow the link provided in your after visit summary or read over the paperwork we have mailed to you to help  you started getting your Advance Directives in place. If you need assistance in completing these, please reach out to us  so that we can help you!

## 2023-09-22 ENCOUNTER — Encounter: Payer: Self-pay | Admitting: Neurology

## 2023-09-23 ENCOUNTER — Other Ambulatory Visit: Payer: Self-pay

## 2023-09-23 DIAGNOSIS — G20A2 Parkinson's disease without dyskinesia, with fluctuations: Secondary | ICD-10-CM

## 2023-09-23 MED ORDER — CARBIDOPA-LEVODOPA 25-100 MG PO TABS: 2.0000 | ORAL_TABLET | Freq: Three times a day (TID) | ORAL | 0 refills | Status: DC

## 2023-09-27 ENCOUNTER — Other Ambulatory Visit: Payer: Self-pay

## 2023-09-27 ENCOUNTER — Encounter: Payer: Self-pay | Admitting: Neurology

## 2023-09-27 DIAGNOSIS — G20A2 Parkinson's disease without dyskinesia, with fluctuations: Secondary | ICD-10-CM

## 2023-09-27 MED ORDER — CARBIDOPA-LEVODOPA 25-100 MG PO TABS: 2.0000 | ORAL_TABLET | Freq: Three times a day (TID) | ORAL | 0 refills | Status: DC

## 2023-09-27 MED ORDER — CARBIDOPA-LEVODOPA ER 50-200 MG PO TBCR: 1.0000 | EXTENDED_RELEASE_TABLET | Freq: Every day | ORAL | 0 refills | Status: DC

## 2023-11-08 ENCOUNTER — Ambulatory Visit: Payer: Medicare HMO | Admitting: Internal Medicine

## 2023-11-11 ENCOUNTER — Ambulatory Visit: Admitting: Internal Medicine

## 2023-12-10 ENCOUNTER — Ambulatory Visit: Admitting: Internal Medicine

## 2023-12-10 ENCOUNTER — Encounter: Payer: Self-pay | Admitting: Internal Medicine

## 2023-12-10 VITALS — BP 180/95 | HR 68 | Temp 98.5°F | Ht 72.0 in | Wt 153.4 lb

## 2023-12-10 DIAGNOSIS — R739 Hyperglycemia, unspecified: Secondary | ICD-10-CM

## 2023-12-10 DIAGNOSIS — R7989 Other specified abnormal findings of blood chemistry: Secondary | ICD-10-CM

## 2023-12-10 DIAGNOSIS — E7800 Pure hypercholesterolemia, unspecified: Secondary | ICD-10-CM

## 2023-12-10 DIAGNOSIS — I10 Essential (primary) hypertension: Secondary | ICD-10-CM

## 2023-12-10 MED ORDER — REPATHA SURECLICK 140 MG/ML ~~LOC~~ SOAJ: 140.0000 mg | SUBCUTANEOUS | 3 refills | Status: DC

## 2023-12-10 NOTE — Progress Notes (Signed)
 Patient ID: [name] Good, male   DOB: 05-21-1940, 83 y.o.   MRN: [id number]        Chief Complaint: follow up HTN, HLD and hyperglycemia , low vit d       HPI:  [name] Good is a 83 y.o. male here overall doing ok.  Pt denies chest pain, increased sob or doe, wheezing, orthopnea, PND, increased LE swelling, palpitations, dizziness or syncope.   Pt denies polydipsia, polyuria, or new focal neuro s/s.    Pt denies fever, wt loss, night sweats, loss of appetite, or other constitutional symptoms  PD has been stable, med working well, To see Dr Tat next month.  BP has been controlled at home, declines change today.     Wt Readings from Last 3 Encounters:  12/10/23 153 lb 6.4 oz (69.6 kg)  09/21/23 158 lb (71.7 kg)  07/23/23 158 lb 6.4 oz (71.8 kg)   BP Readings from Last 3 Encounters:  12/10/23 (!) 180/95  07/23/23 120/70  05/10/23 120/76         Past Medical History:  Diagnosis Date   Balanitis    hx of   Cataract    Glaucoma    Hx of adenomatous polyp of colon 03/09/2014   Hyperlipidemia    Hypertension    Osteoarthritis    diffuse   Past Surgical History:  Procedure Laterality Date   CATARACT EXTRACTION     COLONOSCOPY  06-05-2003   hems only   TONSILLECTOMY      reports that he quit smoking about 46 years ago. His smoking use included cigarettes. He started smoking about 66 years ago. He has a 20 pack-year smoking history. He has never been exposed to tobacco smoke. He has never used smokeless tobacco. He reports that he does not drink alcohol and does not use drugs. family history includes Cancer in his father; Diabetes in his brother; Heart attack in his brother; Other (age of onset: 64) in his mother. Allergies  Allergen Reactions   Lipitor [Atorvastatin] Other (See Comments)    Uneasy feeling Myalgias    Lopid [Gemfibrozil]     Unknown reaction   Penicillins Other (See Comments)    Unknown reaction   Statins Other (See Comments)    Myalgias    Valtrex  [Valacyclovir] Other (See Comments)    Unknown reaction   Vitamin B3 [Niacin] Other (See Comments)    Unknown reaction   Current Outpatient Medications on File Prior to Visit  Medication Sig Dispense Refill   carbidopa -levodopa  (SINEMET  CR) 50-200 MG tablet Take 1 tablet by mouth at bedtime. 90 tablet 0   carbidopa -levodopa  (SINEMET  IR) 25-100 MG tablet Take 2 tablets by mouth 3 (three) times daily. 540 tablet 0   dorzolamide -timolol  (COSOPT ) 22.3-6.8 MG/ML ophthalmic solution Place 1 drop into both eyes 2 (two) times daily.     ezetimibe  (ZETIA ) 10 MG tablet Take 1 tablet (10 mg total) by mouth daily. 90 tablet 3   lactose free nutrition (BOOST PLUS) LIQD Take 237 mLs by mouth 3 (three) times daily with meals.  0   Multiple Vitamins-Minerals (CENTRUM SILVER ADULT 50+) TABS Take 1 tablet by mouth daily.     solifenacin  (VESICARE ) 5 MG tablet Take 1 tablet (5 mg total) by mouth daily. 90 tablet 3   tamsulosin (FLOMAX) 0.4 MG CAPS capsule Take 0.4 mg by mouth daily.     TURMERIC CURCUMIN PO Take 1 tablet by mouth daily.     benzonatate  (TESSALON ) 200  MG capsule Take 1 capsule (200 mg total) by mouth 3 (three) times daily as needed. 60 capsule 0   No current facility-administered medications on file prior to visit.        ROS:  All others reviewed and negative.  Objective        PE:  BP (!) 180/95   Pulse 68   Temp 98.5 F (36.9 C) (Oral)   Ht 6' (1.829 m)   Wt 153 lb 6.4 oz (69.6 kg)   SpO2 98%   BMI 20.80 kg/m                 Constitutional: Pt appears in NAD               HENT: Head: NCAT.                Right Ear: External ear normal.                 Left Ear: External ear normal.                Eyes: . Pupils are equal, round, and reactive to light. Conjunctivae and EOM are normal               Nose: without d/c or deformity               Neck: Neck supple. Gross normal ROM               Cardiovascular: Normal rate and regular rhythm.                 Pulmonary/Chest:  Effort normal and breath sounds without rales or wheezing.                Abd:  Soft, NT, ND, + BS, no organomegaly               Neurological: Pt is alert. At baseline orientation, motor grossly intact               Skin: Skin is warm. No rashes, no other new lesions, LE edema - none               Psychiatric: Pt behavior is normal without agitation   Micro: none  Cardiac tracings I have personally interpreted today:  none  Pertinent Radiological findings (summarize): none   Lab Results  Component Value Date   WBC 3.1 (L) 05/10/2023   HGB 12.8 (L) 05/10/2023   HCT 39.4 05/10/2023   PLT 220.0 05/10/2023   GLUCOSE 98 05/10/2023   CHOL 225 (H) 05/10/2023   TRIG 88.0 05/10/2023   HDL 71.50 05/10/2023   LDLDIRECT 123.0 01/12/2017   LDLCALC 136 (H) 05/10/2023   ALT 4 05/10/2023   AST 15 05/10/2023   NA 140 05/10/2023   K 4.3 05/10/2023   CL 102 05/10/2023   CREATININE 1.11 05/10/2023   BUN 15 05/10/2023   CO2 32 05/10/2023   TSH 0.61 05/10/2023   PSA 3.83 06/03/2021   INR 1.1 (H) 06/03/2021   HGBA1C 5.8 05/10/2023   Assessment/Plan:  [name] Good is a 83 y.o. Black or African American [2] male with  has a past medical history of Balanitis, Cataract, Glaucoma, adenomatous polyp of colon (03/09/2014), Hyperlipidemia, Hypertension, and Osteoarthritis.  Essential hypertension BP Readings from Last 3 Encounters:  12/10/23 (!) 180/95  07/23/23 120/70  05/10/23 120/76   Uncontrolled, possibly reactive, pt declines any change,  declines med tx  Hyperglycemia  Lab Results  Component Value Date   HGBA1C 5.8 05/10/2023   Stable, pt to continue current medical treatment  -diet, wt control   Hyperlipidemia Lab Results  Component Value Date   LDLCALC 136 (H) 05/10/2023   uncontrolled, pt to continue zetia  10 mg every day, but also add repatha  140 mg biweekly   Low vitamin D  level Last vitamin D  Lab Results  Component Value Date   VD25OH 63.83 05/10/2023    Stable, cont oral replacement  Followup: Return in about 6 months (around 06/11/2024).  Lynwood Rush, MD 12/10/2023 9:07 PM [facility]

## 2023-12-10 NOTE — Assessment & Plan Note (Signed)
 Lab Results  Component Value Date   LDLCALC 136 (H) 05/10/2023   uncontrolled, pt to continue zetia  10 mg every day, but also add repatha  140 mg biweekly

## 2023-12-10 NOTE — Assessment & Plan Note (Signed)
 Last vitamin D Lab Results  Component Value Date   VD25OH 63.83 05/10/2023   Stable, cont oral replacement

## 2023-12-10 NOTE — Assessment & Plan Note (Signed)
 BP Readings from Last 3 Encounters:  12/10/23 (!) 180/95  07/23/23 120/70  05/10/23 120/76   Uncontrolled, possibly reactive, pt declines any change,  declines med tx

## 2023-12-10 NOTE — Assessment & Plan Note (Signed)
 Lab Results  Component Value Date   HGBA1C 5.8 05/10/2023   Stable, pt to continue current medical treatment  - diet, wt control

## 2023-12-10 NOTE — Patient Instructions (Signed)
 Please take all new medication as prescribed - the repatha  if ok with insurance  Please continue all other medications as before, and refills have been done if requested.  Please have the pharmacy call with any other refills you may need.  Please continue your efforts at being more active, low cholesterol diet, and weight control.  Please keep your appointments with your specialists as you may have planned  Please make an Appointment to return in 6 months, or sooner if needed, and we will plan on further lab work at that visit

## 2023-12-14 ENCOUNTER — Encounter: Payer: Self-pay | Admitting: Internal Medicine

## 2023-12-16 MED ORDER — REPATHA SURECLICK 140 MG/ML ~~LOC~~ SOAJ: 140.0000 mg | SUBCUTANEOUS | 3 refills | Status: DC

## 2023-12-17 ENCOUNTER — Telehealth: Payer: Self-pay

## 2023-12-17 ENCOUNTER — Other Ambulatory Visit (HOSPITAL_COMMUNITY): Payer: Self-pay

## 2023-12-17 NOTE — Telephone Encounter (Signed)
 Additional information has been requested from the patient's insurance in order to proceed with the prior authorization request. Requested information has been sent, or form has been filled out and faxed back to [phone] Faxed today 12/17/23.

## 2023-12-21 ENCOUNTER — Other Ambulatory Visit: Payer: Self-pay | Admitting: Neurology

## 2023-12-21 DIAGNOSIS — G20A2 Parkinson's disease without dyskinesia, with fluctuations: Secondary | ICD-10-CM

## 2023-12-27 ENCOUNTER — Other Ambulatory Visit (HOSPITAL_COMMUNITY): Payer: Self-pay

## 2023-12-27 NOTE — Telephone Encounter (Signed)
 Pharmacy Patient Advocate Encounter  Received notification from [employer] that Prior Authorization for Repatha  [employer] has been Approved. Patient copay [phone]

## 2023-12-29 NOTE — Telephone Encounter (Signed)
 Messaged patient via MyChart with approval note and confirmation of copay amount

## 2024-01-05 DIAGNOSIS — R350 Frequency of micturition: Secondary | ICD-10-CM | POA: Diagnosis not present

## 2024-01-05 DIAGNOSIS — N401 Enlarged prostate with lower urinary tract symptoms: Secondary | ICD-10-CM | POA: Diagnosis not present

## 2024-02-14 NOTE — Progress Notes (Unsigned)
 Assessment/Plan:    Parkinson's disease, likely idiopathic.  Disease was diagnosed in January, 2024, but likely had the disease for many years prior -Patient does have evidence of bilateral basal ganglia infarcts, so vascular parkinsonism cannot be ruled out.  However, clinically, the patient really does not look like he has vascular parkinsonism.             -*** carbidopa /levodopa  25/100, 2 tablets 3 times per day at 8am/noon/4pm.             -continue carbidopa /levodopa  50/200 CR   2.  Sialorrhea             -Discussed Botox again but his drooling is improved post starting levodopa  and he wants to hold right now   3.  Eyelid opening apraxia             -discussed this can be associated since Parkinsons Disease              -Discussed Botox.  He notes improvement since starting levodopa  4.  SDH  -01/2023, chronic  -Dr. Gillie  Subjective:   [name] Good was seen today in follow up for Parkinsons disease.  My previous records were reviewed prior to todays visit as well as outside records available to me. Pt with [name] Good (caregiver) who supplements hx. patient is currently on carbidopa /levodopa  25/100, 2 tablets 3 times per day (increased) as well as the extended release at bedtime.  He has had no falls since last visit.  No lightheadedness or near syncope.  Current prescribed movement disorder medications: Carbidopa /levodopa  25/100, 2 tablets 3 times per day (increased) Carbidopa /levodopa  50/200 at bedtime    ALLERGIES:   Allergies  Allergen Reactions   Lipitor [Atorvastatin] Other (See Comments)    Uneasy feeling Myalgias    Lopid [Gemfibrozil]     Unknown reaction   Penicillins Other (See Comments)    Unknown reaction   Statins Other (See Comments)    Myalgias    Valtrex [Valacyclovir] Other (See Comments)    Unknown reaction   Vitamin B3 [Niacin] Other (See Comments)    Unknown reaction    CURRENT MEDICATIONS:  No outpatient medications have been marked  as taking for the 02/15/24 encounter (Appointment) with Johnetta Sloniker, [place] RAMAN, DO.     Objective:   PHYSICAL EXAMINATION:    VITALS:   There were no vitals filed for this visit.    GEN:  The patient appears stated age and is in NAD. HEENT:  Normocephalic, atraumatic.  The mucous membranes are moist. The superficial temporal arteries are without ropiness or tenderness. CV:  RRR Lungs:  CTAB Neck/HEME:  There are no carotid bruits bilaterally.  Neurological examination:  Orientation: The patient is alert and oriented x3. Cranial nerves: There is good facial symmetry with facial hypomimia. The speech is stuttering. Soft palate rises symmetrically and there is no tongue deviation. Hearing is intact to conversational tone. Sensation: Sensation is intact to light touch throughout Motor: Strength is at least antigravity x4.  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  He is slow to push off and arise but looks much better.  He is flexed at the knees instead of the waist.  He shuffles.  He freezes in the turn.  I have reviewed and interpreted the following labs independently  Chemistry      Component Value Date/Time   NA 140 05/10/2023 1512   K 4.3 05/10/2023 1512   CL 102 05/10/2023 1512   CO2 32 05/10/2023 1512   BUN 15 05/10/2023 1512   CREATININE 1.11 05/10/2023 1512      Component Value Date/Time   CALCIUM  9.5 05/10/2023 1512   ALKPHOS 56 05/10/2023 1512   AST 15 05/10/2023 1512   ALT 4 05/10/2023 1512   BILITOT 0.5 05/10/2023 1512       Lab Results  Component Value Date   WBC 3.1 (L) 05/10/2023   HGB 12.8 (L) 05/10/2023   HCT 39.4 05/10/2023   MCV 88.5 05/10/2023   PLT 220.0 05/10/2023    Lab Results  Component Value Date   TSH 0.61 05/10/2023     Total time spent on today's  visit was *** minutes, including both face-to-face time and nonface-to-face time.  Time included that spent on review of records (prior notes available to me/labs/imaging if pertinent), discussing treatment and goals, answering patient's questions and coordinating care.  Cc:  [name] Good ORN, MD

## 2024-02-15 ENCOUNTER — Ambulatory Visit (INDEPENDENT_AMBULATORY_CARE_PROVIDER_SITE_OTHER): Admitting: Neurology

## 2024-02-15 VITALS — BP 130/79 | Ht 72.0 in | Wt 150.0 lb

## 2024-02-15 DIAGNOSIS — K117 Disturbances of salivary secretion: Secondary | ICD-10-CM

## 2024-02-15 DIAGNOSIS — G20A2 Parkinson's disease without dyskinesia, with fluctuations: Secondary | ICD-10-CM

## 2024-02-15 MED ORDER — CARBIDOPA-LEVODOPA ER 50-200 MG PO TBCR: 1.0000 | EXTENDED_RELEASE_TABLET | Freq: Every day | ORAL | 1 refills | Status: AC

## 2024-02-15 NOTE — Patient Instructions (Signed)

## 2024-02-18 DIAGNOSIS — H409 Unspecified glaucoma: Secondary | ICD-10-CM | POA: Diagnosis not present

## 2024-02-18 DIAGNOSIS — E049 Nontoxic goiter, unspecified: Secondary | ICD-10-CM | POA: Diagnosis not present

## 2024-02-18 DIAGNOSIS — G20A1 Parkinson's disease without dyskinesia, without mention of fluctuations: Secondary | ICD-10-CM | POA: Diagnosis not present

## 2024-02-18 DIAGNOSIS — N3941 Urge incontinence: Secondary | ICD-10-CM | POA: Diagnosis not present

## 2024-02-18 DIAGNOSIS — G3184 Mild cognitive impairment, so stated: Secondary | ICD-10-CM | POA: Diagnosis not present

## 2024-02-18 DIAGNOSIS — N3943 Post-void dribbling: Secondary | ICD-10-CM | POA: Diagnosis not present

## 2024-02-18 DIAGNOSIS — Z87891 Personal history of nicotine dependence: Secondary | ICD-10-CM | POA: Diagnosis not present

## 2024-02-18 DIAGNOSIS — N400 Enlarged prostate without lower urinary tract symptoms: Secondary | ICD-10-CM | POA: Diagnosis not present

## 2024-02-18 DIAGNOSIS — I951 Orthostatic hypotension: Secondary | ICD-10-CM | POA: Diagnosis not present

## 2024-02-18 DIAGNOSIS — Z5982 Transportation insecurity: Secondary | ICD-10-CM | POA: Diagnosis not present

## 2024-02-18 DIAGNOSIS — K219 Gastro-esophageal reflux disease without esophagitis: Secondary | ICD-10-CM | POA: Diagnosis not present

## 2024-02-18 DIAGNOSIS — E785 Hyperlipidemia, unspecified: Secondary | ICD-10-CM | POA: Diagnosis not present

## 2024-02-18 DIAGNOSIS — Z7982 Long term (current) use of aspirin: Secondary | ICD-10-CM | POA: Diagnosis not present

## 2024-02-18 DIAGNOSIS — Z9989 Dependence on other enabling machines and devices: Secondary | ICD-10-CM | POA: Diagnosis not present

## 2024-06-13 ENCOUNTER — Ambulatory Visit: Admitting: Internal Medicine

## 2024-06-20 ENCOUNTER — Encounter: Admitting: Family Medicine

## 2024-08-15 ENCOUNTER — Ambulatory Visit: Admitting: Neurology

## 2024-09-22 ENCOUNTER — Ambulatory Visit

## 2024-09-25 ENCOUNTER — Ambulatory Visit
